# Patient Record
Sex: Male | Born: 1970
Health system: Southern US, Community
[De-identification: ages and names within clinical notes are randomized; demographics above are authoritative.]

## PROBLEM LIST (undated history)

## (undated) DIAGNOSIS — Z8719 Personal history of other diseases of the digestive system: Secondary | ICD-10-CM

## (undated) DIAGNOSIS — E119 Type 2 diabetes mellitus without complications: Secondary | ICD-10-CM

## (undated) DIAGNOSIS — N201 Calculus of ureter: Secondary | ICD-10-CM

## (undated) DIAGNOSIS — I10 Essential (primary) hypertension: Secondary | ICD-10-CM

---
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
## 2003-07-27 ENCOUNTER — Emergency Department (HOSPITAL_COMMUNITY): Admission: EM | Admit: 2003-07-27 | Discharge: 2003-07-27 | Payer: Self-pay | Admitting: Emergency Medicine

## 2012-03-19 ENCOUNTER — Emergency Department (HOSPITAL_COMMUNITY): Payer: BC Managed Care – PPO

## 2012-03-19 ENCOUNTER — Encounter (HOSPITAL_COMMUNITY): Payer: Self-pay | Admitting: *Deleted

## 2012-03-19 ENCOUNTER — Emergency Department (HOSPITAL_COMMUNITY)
Admission: EM | Admit: 2012-03-19 | Discharge: 2012-03-20 | Disposition: A | Discharge status: Home / Self Care | Payer: BC Managed Care – PPO | Attending: Emergency Medicine | Admitting: Emergency Medicine

## 2012-03-19 DIAGNOSIS — S7010XA Contusion of unspecified thigh, initial encounter: Secondary | ICD-10-CM | POA: Insufficient documentation

## 2012-03-19 DIAGNOSIS — S7012XA Contusion of left thigh, initial encounter: Secondary | ICD-10-CM

## 2012-03-19 DIAGNOSIS — W010XXA Fall on same level from slipping, tripping and stumbling without subsequent striking against object, initial encounter: Secondary | ICD-10-CM | POA: Insufficient documentation

## 2012-03-19 DIAGNOSIS — S5780XA Crushing injury of unspecified forearm, initial encounter: Secondary | ICD-10-CM

## 2012-03-19 DIAGNOSIS — Y9389 Activity, other specified: Secondary | ICD-10-CM | POA: Insufficient documentation

## 2012-03-19 DIAGNOSIS — Y9289 Other specified places as the place of occurrence of the external cause: Secondary | ICD-10-CM | POA: Insufficient documentation

## 2012-03-19 LAB — TYPE AND SCREEN
ABO/RH(D): O POS
Antibody Screen: NEGATIVE

## 2012-03-19 LAB — CBC
HCT: 38.2 % — ABNORMAL LOW (ref 39.0–52.0)
Hemoglobin: 13.6 g/dL (ref 13.0–17.0)
MCH: 28.6 pg (ref 26.0–34.0)
MCHC: 35.6 g/dL (ref 30.0–36.0)
MCV: 80.3 fL (ref 78.0–100.0)
Platelets: 251 K/uL (ref 150–400)
RBC: 4.76 MIL/uL (ref 4.22–5.81)
RDW: 13.3 % (ref 11.5–15.5)
WBC: 18.1 K/uL — ABNORMAL HIGH (ref 4.0–10.5)

## 2012-03-19 LAB — BASIC METABOLIC PANEL
BUN: 19 mg/dL (ref 6–23)
CO2: 24 mEq/L (ref 19–32)
Chloride: 101 mEq/L (ref 96–112)
Creatinine, Ser: 1.62 mg/dL — ABNORMAL HIGH (ref 0.50–1.35)

## 2012-03-19 LAB — PROTIME-INR: Prothrombin Time: 13.2 seconds (ref 11.6–15.2)

## 2012-03-19 MED ORDER — SODIUM CHLORIDE 0.9 % IV SOLN
1000.0000 mL | INTRAVENOUS | Status: DC
  Administered 2012-03-19: 1000 mL via INTRAVENOUS

## 2012-03-19 MED ORDER — OXYCODONE-ACETAMINOPHEN 5-325 MG PO TABS
2.0000 | ORAL_TABLET | Freq: Once | ORAL | Status: AC
  Administered 2012-03-19: 2 via ORAL
  Filled 2012-03-19: qty 2

## 2012-03-19 MED ORDER — MORPHINE SULFATE 4 MG/ML IJ SOLN
4.0000 mg | Freq: Once | INTRAMUSCULAR | Status: AC
  Administered 2012-03-19: 4 mg via INTRAVENOUS
  Filled 2012-03-19: qty 1

## 2012-03-19 MED ORDER — SODIUM CHLORIDE 0.9 % IV SOLN
1000.0000 mL | Freq: Once | INTRAVENOUS | Status: AC
  Administered 2012-03-19: 1000 mL via INTRAVENOUS

## 2012-03-19 NOTE — ED Provider Notes (Signed)
History     CSN: [id number]  Arrival date & time 03/19/12  4098   First MD Initiated Contact with Patient 03/19/12 1825      Chief Complaint  Patient presents with  . Leg Pain     The history is provided by the patient.   patient reports injuring his left thigh earlier today when he slid on ice with his left foot in his anterior left thigh ran into his thumb from his car.  He initially didn't have any significant swelling just some bruising however late in the day he acutely developed severe pain and swelling of his left anterior thigh.  He is on anticoagulants.  At that time he began to feel slightly sweaty as well.  His pain at this time is moderate to severe.  He states the swelling hasn't significantly worsened over the past several hours.  Given the severity of his pain he came to the emergency department for evaluation.  History reviewed. No pertinent past medical history.  Past Surgical History  Procedure Laterality Date  . Appendectomy      No family history on file.  History  Substance Use Topics  . Smoking status: Never Smoker   . Smokeless tobacco: Not on file  . Alcohol Use: Yes     Comment: weekends      Review of Systems  All other systems reviewed and are negative.    Allergies  Review of patient's allergies indicates no known allergies.  Home Medications   Current Outpatient Rx  Name  Route  Sig  Dispense  Refill  . acetaminophen (TYLENOL) 325 MG tablet   Oral   Take 650 mg by mouth every 6 (six) hours as needed for pain.           BP 88/51  Pulse 69  Temp(Src) 98.2 F (36.8 C) (Oral)  Resp 19  Ht 5\' 10"  (1.778 m)  Wt 245 lb (111.131 kg)  BMI 35.15 kg/m2  SpO2 96%  Physical Exam  Nursing note and vitals reviewed. Constitutional: He is oriented to person, place, and time. He appears well-developed and well-nourished.  HENT:  Head: Normocephalic and atraumatic.  Eyes: EOM are normal.  Neck: Normal range of motion.  Cardiovascular:  Normal rate, regular rhythm, normal heart sounds and intact distal pulses.   Pulmonary/Chest: Effort normal and breath sounds normal. No respiratory distress.  Abdominal: Soft. He exhibits no distension. There is no tenderness.  Musculoskeletal:  Normal PT and DP pulses her left foot.  No pain with range of motion of left foot.  Large anterior left mid thigh hematoma with significant swelling.  No pulsatile mass.  No swelling or fullness of his posterior left thigh.  Limited range of motion with flexion of his left hip given the severity of his left thigh pain  Neurological: He is alert and oriented to person, place, and time.  Skin: Skin is warm and dry.  Psychiatric: He has a normal mood and affect. Judgment normal.    ED Course  Procedures (including critical care time)  Labs Reviewed  CBC - Abnormal; Notable for the following:    WBC 18.1 (*)    HCT 38.2 (*)    All other components within normal limits  BASIC METABOLIC PANEL - Abnormal; Notable for the following:    Glucose, Bld 156 (*)    Creatinine, Ser 1.62 (*)    GFR calc non Af Amer 50 (*)    GFR calc Af Amer 58 (*)  All other components within normal limits  PROTIME-INR  TYPE AND SCREEN  ABO/RH   Dg Femur Left  03/19/2012  *RADIOLOGY REPORT*  Clinical Data: Leg pain, blunt trauma  LEFT FEMUR - 2 VIEW  Comparison: None.  Findings: The left hip is located.  No evidence of fracture of the proximal or distal left femur.  The knee joint is intact on two views.  IMPRESSION:  No left femur fracture.   Original Report Authenticated By: Genevive Bi, M.D.    I personally reviewed the imaging tests through PACS system I reviewed available ER/hospitalization records through the EMR   1. Hematoma of thigh, left, initial encounter   2. Orthostatic hypotension       MDM  8:42 PM Is usually if lady hematoma there is left thigh.  The sizes not been increasing over the past several hours.  It is a large left thigh.  He is on  anticoagulants.  At time of discharge the patient stood up became severely lightheaded and nearly passed out.  Drop his pressure down into the knees.  The patient may have bled more and his left side and I initially suspected.  His initial hemoglobin is normal but this may have not equilibrating yet.  I think the patient will benefit from observation overnight and serial CBCs and pain control.  Ice and elevation at this time.  12:01 AM The patient was seen and evaluated by the trauma surgeon at approximately 9 PM this evening.  The trauma surgeon Dr. Derrell Lolling did not think that the patient needed to be admitted.  He asked that the patient stated emergency department for several hours more and that we try to send the patient home.  The patient feels much better at this time.  Patient is able to stand at the bedside and does not become orthostatic.  His vital signs remain stable.  His had no increase in the swelling of his left thigh.  He continues to have normal pulses in his left lower extremity.  He has no signs of compartment syndrome at this time.  The patient was sent home with pain medicine, ice, elevation, crutches.  He is able to bear full weight on his left leg.  He understands to return for worsening symptoms.  He will followup with the trauma surgeons next week and they'll call him for his followup appointment.  The patient feels comfortable with this plan.      Lyanne Co, MD 03/20/12 Marlyne Beards

## 2012-03-19 NOTE — ED Notes (Signed)
Spoke with ortho regarding crutches.

## 2012-03-19 NOTE — Progress Notes (Signed)
Orthopedic Tech Progress Note Patient Details:  Bryan [name] July 05, 1970 [id number]  Ortho Devices Type of Ortho Device: Ace wrap;Crutches Ortho Device/Splint Location: (L) LE Ortho Device/Splint Interventions: Application;Ordered   Jennye Moccasin 03/19/2012, 8:12 PM

## 2012-03-19 NOTE — ED Notes (Signed)
Ortho tech in with cruthches for pt, upon standing pt had syncopal episode, assisted back into bed.  Pt diaphoretic and pale.  EKG completed, NSR on monitor - BP 88/51, HR 69bpm.  Saline lock IV in place.  Pt alert and oriented at present.  Will continue to monitor.

## 2012-03-19 NOTE — Consult Note (Addendum)
Reason for Consult:Left thigh hematoma status post trauma Referring Physician: Dr. Erven Colla MESHULEM ONORATO is an 42 y.o. male.  HPI: the patient is a 42 year old male status post a level fall onto a metal object on his car [place] bumper used to be. The patient states he pain at the time was able to proceed to work. He states he was on his feet are often other day but he had pain in that area. Several hours before presenting to the ED patient states the area began to swell and become ecchymotic. The patient was able to walk fairly well. While in the ED area hematoma had been consistent not growing.  History reviewed. No pertinent past medical history.  Past Surgical History  Procedure Laterality Date  . Appendectomy      No family history on file.  Social History:  reports that he has never smoked. He does not have any smokeless tobacco history on file. He reports that  drinks alcohol. He reports that he does not use illicit drugs.  Allergies: No Known Allergies  Medications: I have reviewed the patient's current medications.  Results for orders placed during the hospital encounter of 03/19/12 (from the past 48 hour(s))  CBC     Status: Abnormal   Collection Time    03/19/12  6:43 PM      Result Value Range   WBC 18.1 (*) 4.0 - 10.5 K/uL   RBC 4.76  4.22 - 5.81 MIL/uL   Hemoglobin 13.6  13.0 - 17.0 g/dL   HCT 16.1 (*) 09.6 - 04.5 %   MCV 80.3  78.0 - 100.0 fL   MCH 28.6  26.0 - 34.0 pg   MCHC 35.6  30.0 - 36.0 g/dL   RDW 40.9  81.1 - 91.4 %   Platelets 251  150 - 400 K/uL  BASIC METABOLIC PANEL     Status: Abnormal   Collection Time    03/19/12  6:43 PM      Result Value Range   Sodium 137  135 - 145 mEq/L   Potassium 3.8  3.5 - 5.1 mEq/L   Chloride 101  96 - 112 mEq/L   CO2 24  19 - 32 mEq/L   Glucose, Bld 156 (*) 70 - 99 mg/dL   BUN 19  6 - 23 mg/dL   Creatinine, Ser 7.82 (*) 0.50 - 1.35 mg/dL   Calcium 9.2  8.4 - 95.6 mg/dL   GFR calc non Af Amer 50 (*) >90  mL/min   GFR calc Af Amer 58 (*) >90 mL/min   Comment:            The eGFR has been calculated     using the CKD EPI equation.     This calculation has not been     validated in all clinical     situations.     eGFR's persistently     <90 mL/min signify     possible Chronic Kidney Disease.  PROTIME-INR     Status: None   Collection Time    03/19/12  6:43 PM      Result Value Range   Prothrombin Time 13.2  11.6 - 15.2 seconds   INR 1.01  0.00 - 1.49  TYPE AND SCREEN     Status: None   Collection Time    03/19/12  9:00 PM      Result Value Range   ABO/RH(D) O POS     Antibody Screen NEG  Sample Expiration 03/22/2012    ABO/RH     Status: None   Collection Time    03/19/12  9:00 PM      Result Value Range   ABO/RH(D) O POS      Dg Femur Left  03/19/2012  *RADIOLOGY REPORT*  Clinical Data: Leg pain, blunt trauma  LEFT FEMUR - 2 VIEW  Comparison: None.  Findings: The left hip is located.  No evidence of fracture of the proximal or distal left femur.  The knee joint is intact on two views.  IMPRESSION:  No left femur fracture.   Original Report Authenticated By: Genevive Bi, M.D.     Review of Systems  HENT: Negative.   Respiratory: Negative.   Cardiovascular: Negative.   Gastrointestinal: Negative.   Skin: Negative.   Neurological: Negative.   All other systems reviewed and are negative.   Blood pressure 124/72, pulse 80, temperature 98.2 F (36.8 C), temperature source Oral, resp. rate 20, height 5\' 10"  (1.778 m), weight 245 lb (111.131 kg), SpO2 98.00%. Physical Exam  Constitutional: He is oriented to person, place, and time. He appears well-developed and well-nourished.  HENT:  Head: Normocephalic and atraumatic.  Eyes: Conjunctivae and EOM are normal. Pupils are equal, round, and reactive to light.  Cardiovascular: Normal rate, regular rhythm and normal heart sounds.   Pulses:      Dorsalis pedis pulses are 2+ on the right side, and 2+ on the left side.        Posterior tibial pulses are 2+ on the right side, and 2+ on the left side.  Respiratory: Effort normal and breath sounds normal.  GI: Soft. Bowel sounds are normal.  Musculoskeletal:       Left upper leg: He exhibits tenderness (over ecchymosis), swelling and edema.       Legs: Neurological: He is alert and oriented to person, place, and time.    Assessment/Plan: 42 year old male with a left thigh hematoma.  He had a stable blood pressure and heart rate, size of hematoma and hematocrit while in the ED.  1. We'll have patient followup in trauma clinic to evaluate his left thigh hematoma. I discussed with the patient at artery become larger in size, or his foot/gastrocnemius become tender he should return in the ED immediately.   Marigene Ehlers., Armando 03/19/2012, 10:50 PM

## 2012-03-19 NOTE — ED Notes (Signed)
Ambulated pt per Dr. Patria Mane request.  Pt tolerated well.  Denied any dizziness and no diaphoresis noted.

## 2012-03-19 NOTE — ED Notes (Signed)
Around 8 am pt was on a hill, slipped on ice.  The bumper of his truck caught the weight of his fall, his L ant thigh taking the weight.  Pt states the thigh was bruised all day, but about 1500 pt began experiencing a great deal of pain and he began feeling diaphoretic.  Approx 6 x 6 inch hematoma to L ant thigh.

## 2012-03-19 NOTE — ED Notes (Signed)
Pt presents to ED for evaluation of a fall.  Pt was walking down driveway this morning around 8am when he slipped on ice and fell into his car bumper hitting his left upper leg against the bumper.  Pt went to work today and noticed a large hematoma to the leg.  Upon inspection leg feels warm and dry, skin appears tight, left pedal pulse strong and present.  Will continue to monitor.

## 2012-03-20 MED ORDER — OXYCODONE-ACETAMINOPHEN 5-325 MG PO TABS: 1.0000 | ORAL_TABLET | ORAL | Status: DC | PRN

## 2012-03-22 ENCOUNTER — Encounter (INDEPENDENT_AMBULATORY_CARE_PROVIDER_SITE_OTHER): Payer: BC Managed Care – PPO

## 2012-03-27 ENCOUNTER — Telehealth (HOSPITAL_COMMUNITY): Payer: Self-pay | Admitting: Emergency Medicine

## 2012-03-27 NOTE — Telephone Encounter (Signed)
Pt said hematoma and bruising was worse. He was unable to make appointment last week. I offered him an appointment on Friday but he thought he would like to be seen sooner. I recommended he go to an UC and if he needed/wanted to follow up with [place] to give [place] a call back.

## 2012-03-28 NOTE — Telephone Encounter (Signed)
scheduled appt

## 2012-03-29 ENCOUNTER — Encounter (INDEPENDENT_AMBULATORY_CARE_PROVIDER_SITE_OTHER): Payer: Self-pay

## 2012-03-29 ENCOUNTER — Encounter (INDEPENDENT_AMBULATORY_CARE_PROVIDER_SITE_OTHER): Payer: Self-pay | Admitting: General Surgery

## 2012-03-29 ENCOUNTER — Ambulatory Visit (INDEPENDENT_AMBULATORY_CARE_PROVIDER_SITE_OTHER): Payer: BC Managed Care – PPO | Admitting: General Surgery

## 2012-03-29 VITALS — BP 122/88 | HR 91 | Temp 96.8°F | Resp 16 | Ht 70.0 in | Wt 256.8 lb

## 2012-03-29 DIAGNOSIS — S7012XD Contusion of left thigh, subsequent encounter: Secondary | ICD-10-CM

## 2012-03-29 DIAGNOSIS — S7010XA Contusion of unspecified thigh, initial encounter: Secondary | ICD-10-CM

## 2012-03-29 NOTE — Patient Instructions (Addendum)
1.  Continue antibiotic - Keflex for full 10 days 2.  Elevation, Compression (ace wrap), Ice, anti-inflammatories (Ibuprofen), ROM exercises, dressing changes and wound care 3.  Follow up next week in trauma clinic for recheck 4.  Ibuprofen 600mg  BID to reduce swelling

## 2012-03-29 NOTE — Progress Notes (Signed)
Subjective: Bryan [name] is a 42 y.o. male who presents today for follow up from large anterior left mid thigh hematoma he sustained after a ground level fall on ice onto a metal object on his car where his bumper used to be.  He was seen in the [facility] on 03/19/12.  Pt complains of continued pain and a large hematoma in the left anterior thigh but also of dependent edema/ecchymosis which is tracking to his lower leg into his foot.  He notes the hematoma has gone down a bit, but he developed blisters over the hematoma which have since ruptured.  He has been applying antibiotic ointment to them.  He went to the UC a few days ago and they gave him Keflex for a suspected infection.  He was given 10 days worth.     Objective: Vital signs in last 24 hours: Reviewed   PE: General:  Alert, NAD, pleasant Left thigh: Impressive large left mid-thigh hematoma with 4cm round unroofed blister, and multiple smaller <1cm blisters which are weeping serous fluid.  5mm rim of erythema over the larger blister.  The hematoma is soft and mobile similar to a cyst.  He is most tender over the medial aspect of the thigh.   Thigh girth - 67.5cm at most prominent part hematoma (mid/upper thigh) L LE: some dependent swelling and ecchymosis to the lower leg, foot and ankle, NT, CSM intact, +2 DP pulse   Assessment/Plan  S/P fall on ice into metal part of his car - large anterior left mid thigh hematoma:  1.  Continue prophylactic Keflex for full 10 days 2.  Elevation, Compression (ace), Ice, Nsaids, ROM exercises, dressing changes and wound care 3.  Follow up next week in trauma clinic for recheck 4.  Ibuprofen 600mg  BID to reduce swelling     Aris Georgia, PA-C 03/29/2012

## 2012-04-05 ENCOUNTER — Encounter (INDEPENDENT_AMBULATORY_CARE_PROVIDER_SITE_OTHER): Payer: Self-pay | Admitting: General Surgery

## 2012-04-05 ENCOUNTER — Ambulatory Visit (INDEPENDENT_AMBULATORY_CARE_PROVIDER_SITE_OTHER): Payer: BC Managed Care – PPO | Admitting: Internal Medicine

## 2012-04-05 ENCOUNTER — Encounter (INDEPENDENT_AMBULATORY_CARE_PROVIDER_SITE_OTHER): Payer: Self-pay

## 2012-04-05 VITALS — BP 124/88 | HR 90 | Temp 97.0°F | Resp 16 | Ht 70.0 in | Wt 257.4 lb

## 2012-04-05 DIAGNOSIS — S7010XA Contusion of unspecified thigh, initial encounter: Secondary | ICD-10-CM

## 2012-04-05 DIAGNOSIS — S7012XD Contusion of left thigh, subsequent encounter: Secondary | ICD-10-CM

## 2012-04-05 NOTE — Patient Instructions (Signed)
Warm compresses three times daily May use ice as well. May return to work on 04/08/12 but stop if symptoms worsen F/u in 2 weeks.

## 2012-04-05 NOTE — Progress Notes (Signed)
  Subjective: Pt returns to the clinic today after suffering a left thigh hematoma.  He is doing well overall.  His hematoma has not changed much but he feels it is somewhat smaller.  Objective: Vital signs in last 24 hours: Reviewed  PE: Heart: rrr Lungs: cta bil Abd: soft, nt, nd Ext: large left mid-thigh hematoma with 4cm round unroofed blister, and multiple smaller <1cm blisters which are weeping serous fluid. 5mm rim of erythema over the larger blister. The hematoma is soft and mobile similar to a cyst. He is most tender over the medial aspect of the thigh   Lab Results:  No results found for this basename: WBC, HGB, HCT, PLT,  in the last 72 hours BMET No results found for this basename: NA, K, CL, CO2, GLUCOSE, BUN, CREATININE, CALCIUM,  in the last 72 hours PT/INR No results found for this basename: LABPROT, INR,  in the last 72 hours CMP     Component Value Date/Time   NA 137 03/19/2012 1843   K 3.8 03/19/2012 1843   CL 101 03/19/2012 1843   CO2 24 03/19/2012 1843   GLUCOSE 156* 03/19/2012 1843   BUN 19 03/19/2012 1843   CREATININE 1.62* 03/19/2012 1843   CALCIUM 9.2 03/19/2012 1843   GFRNONAA 50* 03/19/2012 1843   GFRAA 58* 03/19/2012 1843   Lipase  No results found for this basename: lipase       Studies/Results: No results found.  Anti-infectives: Anti-infectives   None       Assessment/Plan  1.  S/P left thigh hematoma: we will follow up in 2 weeks for recheck, may return to work but is advised to stop if it begins to enlarge or becomes very painful.     WHITE, ELIZABETH 04/05/2012

## 2012-04-19 ENCOUNTER — Encounter (INDEPENDENT_AMBULATORY_CARE_PROVIDER_SITE_OTHER): Payer: Self-pay

## 2012-04-19 ENCOUNTER — Telehealth (INDEPENDENT_AMBULATORY_CARE_PROVIDER_SITE_OTHER): Payer: Self-pay | Admitting: General Surgery

## 2012-04-19 ENCOUNTER — Ambulatory Visit (INDEPENDENT_AMBULATORY_CARE_PROVIDER_SITE_OTHER): Payer: BC Managed Care – PPO | Admitting: Orthopedic Surgery

## 2012-04-19 ENCOUNTER — Other Ambulatory Visit (INDEPENDENT_AMBULATORY_CARE_PROVIDER_SITE_OTHER): Payer: Self-pay | Admitting: General Surgery

## 2012-04-19 VITALS — BP 124/82 | HR 70 | Temp 96.8°F | Resp 16 | Ht 70.0 in | Wt 257.2 lb

## 2012-04-19 DIAGNOSIS — S7012XD Contusion of left thigh, subsequent encounter: Secondary | ICD-10-CM

## 2012-04-19 DIAGNOSIS — S7010XA Contusion of unspecified thigh, initial encounter: Secondary | ICD-10-CM

## 2012-04-19 NOTE — Progress Notes (Signed)
Subjective Bryan [name] comes in s/p left thigh hematoma. He says it's gotten a little better. The blister that is over the area bothers him more than anything.   Objective Left thigh: Large fluctuent mass over anterior left thigh ~19cm x 14cm with 5x2cm wound with good granulation     Procedure Aspiration of left thigh hematoma vs seroma  Surgeon: Charma Igo, PA-C  Assist: None  Anesthesia: 4ml lidocaine 1% w/epi  EBL: Minimal  Complications: None  Findings: Pros and cons of procedure, including infection and recurrence, and consequences of not doing procedure were explained to patient who expressed understanding and desire to proceed. The skin was prepped in a sterile fashion and local anesthetic was infiltrated into the subcutaneous tissue over the mass. A 16-gauge needle was introduced laterally but could only aspirate about 50ml old blood. Another area was anesthetized superiorly and I was able to draw off ~279ml of old blood. Patient tolerated procedure well.   Assessment & Plan Left thigh hematoma -- Advised wrapping with ACE and elevation for next 72 hours. F/u 2 weeks.   Freeman Caldron, PA-C Pager: [phone] General Trauma PA Pager: [phone]

## 2012-04-19 NOTE — Telephone Encounter (Signed)
LMOM on both numbers to let patient know that his appt for 4/18 @ 10 with trauma had been R/S to 4/25 @ 10 due to Good Friday Holiday.Marland KitchenMarland Kitchen

## 2012-05-03 ENCOUNTER — Encounter (INDEPENDENT_AMBULATORY_CARE_PROVIDER_SITE_OTHER): Payer: BC Managed Care – PPO

## 2012-05-10 ENCOUNTER — Encounter (INDEPENDENT_AMBULATORY_CARE_PROVIDER_SITE_OTHER): Payer: Self-pay | Admitting: Internal Medicine

## 2012-05-10 ENCOUNTER — Ambulatory Visit (INDEPENDENT_AMBULATORY_CARE_PROVIDER_SITE_OTHER): Payer: BC Managed Care – PPO | Admitting: General Surgery

## 2012-05-10 VITALS — BP 140/92 | HR 84 | Temp 99.9°F | Resp 18 | Ht 70.0 in | Wt 254.0 lb

## 2012-05-10 DIAGNOSIS — S7012XD Contusion of left thigh, subsequent encounter: Secondary | ICD-10-CM

## 2012-05-10 DIAGNOSIS — S7010XA Contusion of unspecified thigh, initial encounter: Secondary | ICD-10-CM

## 2012-05-10 NOTE — Patient Instructions (Addendum)
Please continue to use the ACE wrap every day until the hematoma is resolved.  Continue the Ibuprofen but take 400mg  (2 tabs) with breakfast and dinner.  Apply heat and massage to the area to help it to go away.  See [place] in 3 weeks.  Please come at 9:40am for check in for 10:00am appt.

## 2012-05-10 NOTE — Progress Notes (Signed)
Subjective  42 yr old male who returns to the clinic today to recheck his left thigh hematoma. He underwent aspiration 2 weeks ago.  He says after the aspiration the thigh was fairly flat.  A few days after the aspiration the area returned to thigh.  His pain is much improved. He has stopped using his ace wrap.  He applies ice/heat to the area and is taking 200mg  Ibuprofen BID.  Pt denies problems with mobility.    Objective  Left thigh: Large hematoma much improved.  Thigh with some palpable hematoma, but is not liquid feels gelatinous.    Assessment & Plan  Left thigh hematoma -- Much improved after aspiration, but with return or residual hematoma.  Please continue to use the ACE wrap every day until the hematoma is resolved.  Continue the Ibuprofen but take 400mg  (2 tabs) with breakfast and dinner.  Apply heat and massage to the area to help it resolve.  See [place] in 3 weeks.  Please come at 9:40am for check in for 10:00am appt.     Bryan [name] 05/10/2012 11:10 AM

## 2012-05-31 ENCOUNTER — Encounter (INDEPENDENT_AMBULATORY_CARE_PROVIDER_SITE_OTHER): Payer: BC Managed Care – PPO

## 2014-07-30 ENCOUNTER — Inpatient Hospital Stay (HOSPITAL_COMMUNITY)
Admission: EM | Admit: 2014-07-30 | Discharge: 2014-08-04 | DRG: 392 | Disposition: A | Discharge status: Home / Self Care | Payer: BLUE CROSS/BLUE SHIELD | Attending: General Surgery | Admitting: General Surgery

## 2014-07-30 ENCOUNTER — Emergency Department (HOSPITAL_COMMUNITY): Payer: BLUE CROSS/BLUE SHIELD

## 2014-07-30 ENCOUNTER — Encounter (HOSPITAL_COMMUNITY): Payer: Self-pay | Admitting: Neurology

## 2014-07-30 DIAGNOSIS — R319 Hematuria, unspecified: Secondary | ICD-10-CM | POA: Diagnosis present

## 2014-07-30 DIAGNOSIS — R1084 Generalized abdominal pain: Secondary | ICD-10-CM

## 2014-07-30 DIAGNOSIS — R509 Fever, unspecified: Secondary | ICD-10-CM

## 2014-07-30 DIAGNOSIS — Z8719 Personal history of other diseases of the digestive system: Secondary | ICD-10-CM

## 2014-07-30 DIAGNOSIS — K5720 Diverticulitis of large intestine with perforation and abscess without bleeding: Principal | ICD-10-CM | POA: Diagnosis present

## 2014-07-30 DIAGNOSIS — N390 Urinary tract infection, site not specified: Secondary | ICD-10-CM | POA: Diagnosis present

## 2014-07-30 DIAGNOSIS — K5792 Diverticulitis of intestine, part unspecified, without perforation or abscess without bleeding: Secondary | ICD-10-CM | POA: Diagnosis present

## 2014-07-30 HISTORY — DX: Personal history of other diseases of the digestive system: Z87.19

## 2014-07-30 LAB — COMPREHENSIVE METABOLIC PANEL
ALT: 18 U/L (ref 17–63)
AST: 14 U/L — ABNORMAL LOW (ref 15–41)
Albumin: 3.3 g/dL — ABNORMAL LOW (ref 3.5–5.0)
Alkaline Phosphatase: 53 U/L (ref 38–126)
Anion gap: 7 (ref 5–15)
BUN: 14 mg/dL (ref 6–20)
CO2: 24 mmol/L (ref 22–32)
Calcium: 8.6 mg/dL — ABNORMAL LOW (ref 8.9–10.3)
Chloride: 105 mmol/L (ref 101–111)
Creatinine, Ser: 1.52 mg/dL — ABNORMAL HIGH (ref 0.61–1.24)
GFR calc Af Amer: >60 mL/min (ref >60)
GFR calc non Af Amer: 53 mL/min — ABNORMAL LOW (ref >60)
Glucose, Bld: 149 mg/dL — ABNORMAL HIGH (ref 65–99)
Potassium: 3.6 mmol/L (ref 3.5–5.1)
Sodium: 136 mmol/L (ref 135–145)
Total Bilirubin: 0.8 mg/dL (ref 0.3–1.2)
Total Protein: 6.9 g/dL (ref 6.5–8.1)

## 2014-07-30 LAB — URINALYSIS, ROUTINE W REFLEX MICROSCOPIC
Glucose, UA: NEGATIVE mg/dL
Ketones, ur: 15 mg/dL — AB
Nitrite: NEGATIVE
Protein, ur: >300 mg/dL — AB
Specific Gravity, Urine: 1.031 — ABNORMAL HIGH (ref 1.005–1.030)
Urobilinogen, UA: 1 mg/dL (ref 0.0–1.0)
pH: 5 (ref 5.0–8.0)

## 2014-07-30 LAB — CBC
HCT: 40 % (ref 39.0–52.0)
Hemoglobin: 13.6 g/dL (ref 13.0–17.0)
MCH: 28 pg (ref 26.0–34.0)
MCHC: 34 g/dL (ref 30.0–36.0)
MCV: 82.5 fL (ref 78.0–100.0)
Platelets: 223 10*3/uL (ref 150–400)
RBC: 4.85 MIL/uL (ref 4.22–5.81)
RDW: 13.8 % (ref 11.5–15.5)
WBC: 16.7 10*3/uL — ABNORMAL HIGH (ref 4.0–10.5)

## 2014-07-30 LAB — URINE MICROSCOPIC-ADD ON

## 2014-07-30 LAB — LIPASE, BLOOD: Lipase: 19 U/L — ABNORMAL LOW (ref 22–51)

## 2014-07-30 MED ORDER — ONDANSETRON HCL 4 MG/2ML IJ SOLN: 4.0000 mg | Freq: Four times a day (QID) | INTRAMUSCULAR | Status: DC | PRN

## 2014-07-30 MED ORDER — ONDANSETRON HCL 4 MG/2ML IJ SOLN
4.0000 mg | Freq: Once | INTRAMUSCULAR | Status: AC
  Administered 2014-07-30: 4 mg via INTRAVENOUS
  Filled 2014-07-30: qty 2

## 2014-07-30 MED ORDER — HYDROMORPHONE HCL 1 MG/ML IJ SOLN
1.0000 mg | INTRAMUSCULAR | Status: DC | PRN
  Administered 2014-07-30 (×2): 1 mg via INTRAVENOUS
  Administered 2014-07-31 (×7): 2 mg via INTRAVENOUS
  Administered 2014-08-01 (×2): 1 mg via INTRAVENOUS
  Administered 2014-08-01 (×3): 2 mg via INTRAVENOUS
  Administered 2014-08-01 (×2): 1 mg via INTRAVENOUS
  Administered 2014-08-01: 2 mg via INTRAVENOUS
  Administered 2014-08-02 – 2014-08-04 (×13): 1 mg via INTRAVENOUS
  Filled 2014-07-30 (×2): qty 2
  Filled 2014-07-30 (×3): qty 1
  Filled 2014-07-30: qty 2
  Filled 2014-07-30 (×2): qty 1
  Filled 2014-07-30: qty 2
  Filled 2014-07-30 (×5): qty 1
  Filled 2014-07-30 (×2): qty 2
  Filled 2014-07-30 (×4): qty 1
  Filled 2014-07-30: qty 2
  Filled 2014-07-30 (×3): qty 1
  Filled 2014-07-30: qty 2
  Filled 2014-07-30 (×2): qty 1
  Filled 2014-07-30: qty 2
  Filled 2014-07-30: qty 1
  Filled 2014-07-30: qty 2

## 2014-07-30 MED ORDER — ENOXAPARIN SODIUM 40 MG/0.4ML ~~LOC~~ SOLN
40.0000 mg | SUBCUTANEOUS | Status: DC
  Administered 2014-07-30 – 2014-08-03 (×5): 40 mg via SUBCUTANEOUS
  Filled 2014-07-30 (×5): qty 0.4

## 2014-07-30 MED ORDER — DEXTROSE 5 % IV SOLN
1.0000 g | Freq: Once | INTRAVENOUS | Status: AC
  Administered 2014-07-30: 1 g via INTRAVENOUS
  Filled 2014-07-30: qty 10

## 2014-07-30 MED ORDER — ACETAMINOPHEN 325 MG PO TABS: 650.0000 mg | ORAL_TABLET | Freq: Four times a day (QID) | ORAL | Status: DC | PRN

## 2014-07-30 MED ORDER — PANTOPRAZOLE SODIUM 40 MG IV SOLR
40.0000 mg | Freq: Every day | INTRAVENOUS | Status: DC
  Administered 2014-07-30 – 2014-08-03 (×5): 40 mg via INTRAVENOUS
  Filled 2014-07-30 (×5): qty 40

## 2014-07-30 MED ORDER — ACETAMINOPHEN 650 MG RE SUPP: 650.0000 mg | Freq: Four times a day (QID) | RECTAL | Status: DC | PRN

## 2014-07-30 MED ORDER — IOHEXOL 300 MG/ML  SOLN
25.0000 mL | Freq: Once | INTRAMUSCULAR | Status: AC | PRN
  Administered 2014-07-30: 25 mL via ORAL

## 2014-07-30 MED ORDER — CEPHALEXIN 500 MG PO CAPS: 500.0000 mg | ORAL_CAPSULE | Freq: Four times a day (QID) | ORAL | Status: DC

## 2014-07-30 MED ORDER — HYDROMORPHONE HCL 1 MG/ML IJ SOLN
1.0000 mg | Freq: Once | INTRAMUSCULAR | Status: AC
  Administered 2014-07-30: 1 mg via INTRAVENOUS
  Filled 2014-07-30: qty 1

## 2014-07-30 MED ORDER — DEXTROSE 5 % IV SOLN: 1.0000 g | INTRAVENOUS | Status: DC

## 2014-07-30 MED ORDER — IOHEXOL 300 MG/ML  SOLN
100.0000 mL | Freq: Once | INTRAMUSCULAR | Status: AC | PRN
  Administered 2014-07-30: 100 mL via INTRAVENOUS

## 2014-07-30 MED ORDER — SODIUM CHLORIDE 0.9 % IV BOLUS (SEPSIS)
1000.0000 mL | Freq: Once | INTRAVENOUS | Status: AC
  Administered 2014-07-30: 1000 mL via INTRAVENOUS

## 2014-07-30 MED ORDER — METRONIDAZOLE IN NACL 5-0.79 MG/ML-% IV SOLN
500.0000 mg | Freq: Once | INTRAVENOUS | Status: AC
  Administered 2014-07-30: 500 mg via INTRAVENOUS
  Filled 2014-07-30: qty 100

## 2014-07-30 MED ORDER — CHLORHEXIDINE GLUCONATE 0.12 % MT SOLN
15.0000 mL | Freq: Two times a day (BID) | OROMUCOSAL | Status: DC
  Administered 2014-07-30 – 2014-08-03 (×9): 15 mL via OROMUCOSAL
  Filled 2014-07-30 (×7): qty 15

## 2014-07-30 MED ORDER — ACETAMINOPHEN 325 MG PO TABS
650.0000 mg | ORAL_TABLET | Freq: Once | ORAL | Status: AC
  Administered 2014-07-30: 650 mg via ORAL
  Filled 2014-07-30: qty 2

## 2014-07-30 MED ORDER — CEFTRIAXONE SODIUM IN DEXTROSE 40 MG/ML IV SOLN
2.0000 g | INTRAVENOUS | Status: DC
  Administered 2014-07-31 – 2014-08-03 (×4): 2 g via INTRAVENOUS
  Filled 2014-07-30 (×5): qty 50

## 2014-07-30 MED ORDER — POTASSIUM CHLORIDE IN NACL 20-0.45 MEQ/L-% IV SOLN
INTRAVENOUS | Status: DC
  Administered 2014-07-30 – 2014-08-03 (×8): via INTRAVENOUS
  Filled 2014-07-30 (×15): qty 1000

## 2014-07-30 MED ORDER — CETYLPYRIDINIUM CHLORIDE 0.05 % MT LIQD
7.0000 mL | Freq: Two times a day (BID) | OROMUCOSAL | Status: DC
  Administered 2014-07-31 – 2014-08-03 (×4): 7 mL via OROMUCOSAL

## 2014-07-30 MED ORDER — METRONIDAZOLE IN NACL 5-0.79 MG/ML-% IV SOLN
500.0000 mg | Freq: Three times a day (TID) | INTRAVENOUS | Status: DC
  Administered 2014-07-30 – 2014-08-04 (×14): 500 mg via INTRAVENOUS
  Filled 2014-07-30 (×17): qty 100

## 2014-07-30 NOTE — ED Notes (Signed)
Pt reports LLQ abd pain since Monday night, [facility] told blood in urine and given shot for antibiotic. Reports still having pain, urine is cloudy and been running a fever.

## 2014-07-30 NOTE — ED Provider Notes (Signed)
CSN: [id number]     Arrival date & time 07/30/14  [id number] History   First MD Initiated Contact with Patient 07/30/14 1202     Chief Complaint  Patient presents with  . Abdominal Pain     (Consider location/radiation/quality/duration/timing/severity/associated sxs/prior Treatment) HPI   [age]y male with abdominal pain. Onset Monday and progressively worsening since then. Pain is primarily in the left lower quadrant. Relatively constant. Worse with movement. "I have to walk like an old man." No appreciable exacerbating relieving factors. Associated with hematuria. Denies any dysuria. Recently evaluated at urgent care for the same. Patient reports that he was told "I had an abdominal infection and got an antibiotic shot." He is not sure what specifically he was being treated for. He was not prescribed antibiotics. Symptoms have not improved. Subjective fever and chills. Nausea, but no vomiting. Has not had a bowel movement 3 days. Appendectomy as a small child. No abdominal surgeries otherwise.  History reviewed. No pertinent past medical history. Past Surgical History  Procedure Laterality Date  . Appendectomy     Family History  Problem Relation Age of Onset  . Cancer Father     lung   History  Substance Use Topics  . Smoking status: Never Smoker   . Smokeless tobacco: Not on file  . Alcohol Use: Yes     Comment: weekends    Review of Systems  All systems reviewed and negative, other than as noted in HPI.   Allergies  Review of patient's allergies indicates no known allergies.  Home Medications   Prior to Admission medications   Medication Sig Start Date End Date Taking? Authorizing Provider  ibuprofen (ADVIL,MOTRIN) 200 MG tablet Take 200 mg by mouth every 6 (six) hours as needed for pain.   Yes Historical Provider, MD  cephALEXin (KEFLEX) 500 MG capsule Take 1 capsule (500 mg total) by mouth 4 (four) times daily. 07/30/14   Raeford RazorStephen Lalanya Rufener, MD   BP 147/78 mmHg  Pulse 89   Temp(Src) 101.9 F (38.8 C) (Oral)  Resp 18  Ht 5\' 10"  (1.778 m)  Wt 270 lb (122.471 kg)  BMI 38.74 kg/m2  SpO2 95% Physical Exam  Constitutional: He appears well-developed and well-nourished. No distress.  HENT:  Head: Normocephalic and atraumatic.  Eyes: Conjunctivae are normal. Right eye exhibits no discharge. Left eye exhibits no discharge.  Neck: Neck supple.  Cardiovascular: Normal rate, regular rhythm and normal heart sounds.  Exam reveals no gallop and no friction rub.   No murmur heard. Pulmonary/Chest: Effort normal and breath sounds normal. No respiratory distress.  Abdominal: Soft. He exhibits no distension. There is tenderness.  Diffusely tender. Focally worse in LLQ. No distension. NO rebound.   Genitourinary:  No cva tenderness  Musculoskeletal: He exhibits no edema or tenderness.  Neurological: He is alert.  Skin: Skin is warm and dry.  Psychiatric: He has a normal mood and affect. His behavior is normal. Thought content normal.  Nursing note and vitals reviewed.   is ED Course  Procedures (including critical care time) Labs Review Labs Reviewed  LIPASE, BLOOD - Abnormal; Notable for the following:    Lipase 19 (*)    All other components within normal limits  COMPREHENSIVE METABOLIC PANEL - Abnormal; Notable for the following:    Glucose, Bld 149 (*)    Creatinine, Ser 1.52 (*)    Calcium 8.6 (*)    Albumin 3.3 (*)    AST 14 (*)    GFR calc non Af Denyse DagoAmer  53 (*)    All other components within normal limits  CBC - Abnormal; Notable for the following:    WBC 16.7 (*)    All other components within normal limits  URINALYSIS, ROUTINE W REFLEX MICROSCOPIC (NOT AT [facility]) - Abnormal; Notable for the following:    Color, Urine BROWN (*)    APPearance TURBID (*)    Specific Gravity, Urine 1.031 (*)    Hgb urine dipstick LARGE (*)    Bilirubin Urine MODERATE (*)    Ketones, ur 15 (*)    Protein, ur >300 (*)    Leukocytes, UA SMALL (*)    All other components  within normal limits  URINE MICROSCOPIC-ADD ON - Abnormal; Notable for the following:    Bacteria, UA MANY (*)    Casts HYALINE CASTS (*)    All other components within normal limits  URINE CULTURE    Imaging Review Ct Abdomen Pelvis W Contrast  07/30/2014   CLINICAL DATA:  Acute left lower quadrant abdominal pain and fever.  EXAM: CT ABDOMEN AND PELVIS WITH CONTRAST  TECHNIQUE: Multidetector CT imaging of the abdomen and pelvis was performed using the standard protocol following bolus administration of intravenous contrast.  CONTRAST:  OMNIPAQUE IOHEXOL 300 MG/ML  SOLN  COMPARISON:  None.  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 Visualized lung bases appear normal.  No gallstones are noted. Fatty infiltration of the liver is noted. The spleen and pancreas appear normal. Adrenal glands appear normal. Nonobstructive calculus is noted in midpole collecting system of right kidney. No hydronephrosis or renal obstruction is noted. No ureteral calculi are noted. The patient is reportedly status post appendectomy. There is severe focal diverticulitis involving the proximal sigmoid colon with multiple gas collections seen in the surrounding peritoneal fat consistent with perforation. Some free fluid is seen adjacent to this. Urinary bladder appears normal. No significant adenopathy is noted  IMPRESSION: Fatty infiltration of the liver.  Nonobstructive right renal calculus.  Severe focal diverticulitis of proximal sigmoid colon is noted, with gas noted in the surrounding peritoneal fat consistent with perforation. Some free fluid is seen in this area as well.   Electronically Signed   By: Lupita Raider, M.D.   On: 07/30/2014 16:34     EKG Interpretation None      MDM   Final diagnoses:  Generalized abdominal pain  Fever, unspecified fever cause  UTI (lower urinary tract infection)    28y male with abdominal pain. Left lower quadrant. Diffusely tender  on exam, but focally worse in the left lower quadrant. UA suggestive of UTI. Rocephin was initially ordered. His exam seemed out of proportion for typical UTI though and atypical to be focally worse in LLQ. CT was done.  Diverticulitis with small amount of free air.  Flagyl was ordered for anaerobic coverage. Surgical consultation.  4:55 PM Discussed with Dr Magnus Ivan, surgery. Changing coverage at 5PM. Will pass along information to oncoming provider.     Raeford Razor, MD 07/30/14 [phone]

## 2014-07-30 NOTE — H&P (Signed)
Bryan [name] is an 44 y.o. male.   Chief Complaint: Abdominal pain, severe HPI: Worsening abdominal pain since Tuesday, associated with left sided tenderness and a CT that shows acute diverticulititis with microperforation.  History reviewed. No pertinent past medical history.  Past Surgical History  Procedure Laterality Date  . Appendectomy      Family History  Problem Relation Age of Onset  . Cancer Father     lung   Social History:  reports that he has never smoked. He does not have any smokeless tobacco history on file. He reports that he drinks alcohol. He reports that he does not use illicit drugs.  Allergies: No Known Allergies   (Not in a hospital admission)  Results for orders placed or performed during the hospital encounter of 07/30/14 (from the past 48 hour(s))  Lipase, blood     Status: Abnormal   Collection Time: 07/30/14 10:00 AM  Result Value Ref Range   Lipase 19 (L) 22 - 51 U/L  Comprehensive metabolic panel     Status: Abnormal   Collection Time: 07/30/14 10:00 AM  Result Value Ref Range   Sodium 136 135 - 145 mmol/L   Potassium 3.6 3.5 - 5.1 mmol/L   Chloride 105 101 - 111 mmol/L   CO2 24 22 - 32 mmol/L   Glucose, Bld 149 (H) 65 - 99 mg/dL   BUN 14 6 - 20 mg/dL   Creatinine, Ser 1.52 (H) 0.61 - 1.24 mg/dL   Calcium 8.6 (L) 8.9 - 10.3 mg/dL   Total Protein 6.9 6.5 - 8.1 g/dL   Albumin 3.3 (L) 3.5 - 5.0 g/dL   AST 14 (L) 15 - 41 U/L   ALT 18 17 - 63 U/L   Alkaline Phosphatase 53 38 - 126 U/L   Total Bilirubin 0.8 0.3 - 1.2 mg/dL   GFR calc non Af Amer 53 (L) >60 mL/min   GFR calc Af Amer >60 >60 mL/min    Comment: (NOTE) The eGFR has been calculated using the CKD EPI equation. This calculation has not been validated in all clinical situations. eGFR's persistently <60 mL/min signify possible Chronic Kidney Disease.    Anion gap 7 5 - 15  CBC     Status: Abnormal   Collection Time: 07/30/14 10:00 AM  Result Value Ref Range   WBC 16.7  (H) 4.0 - 10.5 K/uL   RBC 4.85 4.22 - 5.81 MIL/uL   Hemoglobin 13.6 13.0 - 17.0 g/dL   HCT 40.0 39.0 - 52.0 %   MCV 82.5 78.0 - 100.0 fL   MCH 28.0 26.0 - 34.0 pg   MCHC 34.0 30.0 - 36.0 g/dL   RDW 13.8 11.5 - 15.5 %   Platelets 223 150 - 400 K/uL  Urinalysis, Routine w reflex microscopic (not at [facility])     Status: Abnormal   Collection Time: 07/30/14  1:57 PM  Result Value Ref Range   Color, Urine BROWN (A) YELLOW    Comment: BIOCHEMICALS MAY BE AFFECTED BY COLOR   APPearance TURBID (A) CLEAR   Specific Gravity, Urine 1.031 (H) 1.005 - 1.030   pH 5.0 5.0 - 8.0   Glucose, UA NEGATIVE NEGATIVE mg/dL   Hgb urine dipstick LARGE (A) NEGATIVE   Bilirubin Urine MODERATE (A) NEGATIVE   Ketones, ur 15 (A) NEGATIVE mg/dL   Protein, ur >300 (A) NEGATIVE mg/dL   Urobilinogen, UA 1.0 0.0 - 1.0 mg/dL   Nitrite NEGATIVE NEGATIVE   Leukocytes, UA SMALL (A) NEGATIVE  Urine microscopic-add on     Status: Abnormal   Collection Time: 07/30/14  1:57 PM  Result Value Ref Range   WBC, UA 7-10 <3 WBC/hpf   RBC / HPF 21-50 <3 RBC/hpf   Bacteria, UA MANY (A) RARE   Casts HYALINE CASTS (A) NEGATIVE    Comment: GRANULAR CAST   Ct Abdomen Pelvis W Contrast  07/30/2014   CLINICAL DATA:  Acute left lower quadrant abdominal pain and fever.  EXAM: CT ABDOMEN AND PELVIS WITH CONTRAST  TECHNIQUE: Multidetector CT imaging of the abdomen and pelvis was performed using the standard protocol following bolus administration of intravenous contrast.  CONTRAST:  162m OMNIPAQUE IOHEXOL 300 MG/ML  SOLN  COMPARISON:  None.  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 Visualized lung bases appear normal.  No gallstones are noted. Fatty infiltration of the liver is noted. The spleen and pancreas appear normal. Adrenal glands appear normal. Nonobstructive calculus is noted in midpole collecting system of right kidney. No hydronephrosis or renal obstruction is noted. No ureteral calculi are  noted. The patient is reportedly status post appendectomy. There is severe focal diverticulitis involving the proximal sigmoid colon with multiple gas collections seen in the surrounding peritoneal fat consistent with perforation. Some free fluid is seen adjacent to this. Urinary bladder appears normal. No significant adenopathy is noted  IMPRESSION: Fatty infiltration of the liver.  Nonobstructive right renal calculus.  Severe focal diverticulitis of proximal sigmoid colon is noted, with gas noted in the surrounding peritoneal fat consistent with perforation. Some free fluid is seen in this area as well.   Electronically Signed   By: JMarijo Conception M.D.   On: 07/30/2014 16:34    Review of Systems  Constitutional: Negative for fever and chills.  Gastrointestinal: Positive for abdominal pain. Negative for nausea, vomiting and blood in stool.  Genitourinary: Positive for dysuria and hematuria (darkurine the past several days.).       No pneumaturia  All other systems reviewed and are negative.   Blood pressure 147/78, pulse 89, temperature 101.9 F (38.8 C), temperature source Oral, resp. rate 18, height '5\' 10"'  (1.778 m), weight 122.471 kg (270 lb), SpO2 95 %. Physical Exam  Constitutional: He is oriented to person, place, and time. He appears well-developed.  Obese  HENT:  Head: Normocephalic and atraumatic.  Eyes: Conjunctivae and EOM are normal. Pupils are equal, round, and reactive to light.  Neck: Normal range of motion. Neck supple.  Cardiovascular: Normal rate, regular rhythm and normal heart sounds.   Respiratory: Effort normal and breath sounds normal.  GI: Soft. Bowel sounds are normal. He exhibits distension. There is tenderness in the left upper quadrant. There is rebound and guarding. There is no rigidity and no CVA tenderness.  No suprapubic tenderness  Genitourinary: Prostate normal and penis normal.  Musculoskeletal: Normal range of motion.  Neurological: He is alert and  oriented to person, place, and time.  Skin: Skin is warm and dry.  Psychiatric: He has a normal mood and affect. His behavior is normal. Thought content normal.     Assessment/Plan Acute diverticulitis with microperforation into the mesentry and pericolonic. Urinary tract infection. Increased creatinine.  Admit for pain control, IV hydration and IV antibiotic.  Kilynn Fitzsimmons 07/30/2014, 5:37 PM

## 2014-07-30 NOTE — ED Notes (Signed)
Pt denies n/v/d 

## 2014-07-31 LAB — BASIC METABOLIC PANEL
Anion gap: 6 (ref 5–15)
BUN: 12 mg/dL (ref 6–20)
CO2: 27 mmol/L (ref 22–32)
Calcium: 7.9 mg/dL — ABNORMAL LOW (ref 8.9–10.3)
Chloride: 104 mmol/L (ref 101–111)
Creatinine, Ser: 1.37 mg/dL — ABNORMAL HIGH (ref 0.61–1.24)
GFR calc Af Amer: >60 mL/min (ref >60)
Glucose, Bld: 108 mg/dL — ABNORMAL HIGH (ref 65–99)
Potassium: 3.6 mmol/L (ref 3.5–5.1)
SODIUM: 137 mmol/L (ref 135–145)

## 2014-07-31 LAB — CBC
HEMATOCRIT: 36.6 % — AB (ref 39.0–52.0)
HEMOGLOBIN: 12.3 g/dL — AB (ref 13.0–17.0)
MCH: 28.6 pg (ref 26.0–34.0)
MCHC: 33.6 g/dL (ref 30.0–36.0)
MCV: 85.1 fL (ref 78.0–100.0)
Platelets: 235 10*3/uL (ref 150–400)
RBC: 4.3 MIL/uL (ref 4.22–5.81)
RDW: 13.9 % (ref 11.5–15.5)
WBC: 12.7 10*3/uL — ABNORMAL HIGH (ref 4.0–10.5)

## 2014-07-31 NOTE — Progress Notes (Signed)
Subjective: He reports that his abdominal pain is less  Objective: Vital signs in last 24 hours: Temp:  [98.8 F (37.1 C)-101.9 F (38.8 C)] 99.3 F (37.4 C) (07/15 0554) Pulse Rate:  [81-98] 90 (07/15 0550) Resp:  [16-18] 16 (07/15 0550) BP: (130-156)/(76-91) 137/82 mmHg (07/15 0550) SpO2:  [93 %-100 %] 100 % (07/15 0550) Weight:  [122.471 kg (270 lb)] 122.471 kg (270 lb) (07/14 0957) Last BM Date: 07/27/14  Intake/Output from previous day: 07/14 0701 - 07/15 0700 In: 1575 [I.V.:1575] Out: 100 [Urine:100] Intake/Output this shift:    Abdomen still with moderate LLQ tenderness and guarding.  No frank peritonitis  Lab Results:   Recent Labs  07/30/14 1000  WBC 16.7*  HGB 13.6  HCT 40.0  PLT 223   BMET  Recent Labs  07/30/14 1000  NA 136  K 3.6  CL 105  CO2 24  GLUCOSE 149*  BUN 14  CREATININE 1.52*  CALCIUM 8.6*   PT/INR No results for input(s): LABPROT, INR in the last 72 hours. ABG No results for input(s): PHART, HCO3 in the last 72 hours.  Invalid input(s): PCO2, PO2  Studies/Results: Ct Abdomen Pelvis W Contrast  07/30/2014   CLINICAL DATA:  Acute left lower quadrant abdominal pain and fever.  EXAM: CT ABDOMEN AND PELVIS WITH CONTRAST  TECHNIQUE: Multidetector CT imaging of the abdomen and pelvis was performed using the standard protocol following bolus administration of intravenous contrast.  CONTRAST:  100mL OMNIPAQUE IOHEXOL 300 MG/ML  SOLN  COMPARISON:  None.  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 Visualized lung bases appear normal.  No gallstones are noted. Fatty infiltration of the liver is noted. The spleen and pancreas appear normal. Adrenal glands appear normal. Nonobstructive calculus is noted in midpole collecting system of right kidney. No hydronephrosis or renal obstruction is noted. No ureteral calculi are noted. The patient is reportedly status post appendectomy. There is severe focal  diverticulitis involving the proximal sigmoid colon with multiple gas collections seen in the surrounding peritoneal fat consistent with perforation. Some free fluid is seen adjacent to this. Urinary bladder appears normal. No significant adenopathy is noted  IMPRESSION: Fatty infiltration of the liver.  Nonobstructive right renal calculus.  Severe focal diverticulitis of proximal sigmoid colon is noted, with gas noted in the surrounding peritoneal fat consistent with perforation. Some free fluid is seen in this area as well.   Electronically Signed   By: Lupita RaiderJames  Green Jr, M.D.   On: 07/30/2014 16:34    Anti-infectives: Anti-infectives    Start     Dose/Rate Route Frequency Ordered Stop   07/31/14 1745  cefTRIAXone (ROCEPHIN) 2 g in dextrose 5 % 50 mL IVPB - Premix     2 g 100 mL/hr over 30 Minutes Intravenous Every 24 hours 07/30/14 1829     07/30/14 1830  metroNIDAZOLE (FLAGYL) IVPB 500 mg     500 mg 100 mL/hr over 60 Minutes Intravenous Every 8 hours 07/30/14 1819     07/30/14 1745  cefTRIAXone (ROCEPHIN) 1 g in dextrose 5 % 50 mL IVPB  Status:  Discontinued     1 g 100 mL/hr over 30 Minutes Intravenous Every 24 hours 07/30/14 1734 07/30/14 1829   07/30/14 1630  metroNIDAZOLE (FLAGYL) IVPB 500 mg     500 mg 100 mL/hr over 60 Minutes Intravenous  Once 07/30/14 1628 07/30/14 1748   07/30/14 1430  cefTRIAXone (ROCEPHIN) 1 g in dextrose 5 % 50 mL IVPB  1 g 100 mL/hr over 30 Minutes Intravenous  Once 07/30/14 1425 07/30/14 1513   07/30/14 0000  cephALEXin (KEFLEX) 500 MG capsule     500 mg Oral 4 times daily 07/30/14 1614        Assessment/Plan:  Diverticulitis with microperforation  Will continue bowel rest and IV antibiotics.  CBC pending. Hopefully this will continue to improve without the need for a laparotomy and colostomy  LOS: 1 day    Bryan [name] A 07/31/2014 4

## 2014-07-31 NOTE — Progress Notes (Signed)
Utilization review completed. Veretta Sabourin, RN, BSN. 

## 2014-08-01 LAB — URINE CULTURE: Culture: NO GROWTH

## 2014-08-01 LAB — CBC
HCT: 33.8 % — ABNORMAL LOW (ref 39.0–52.0)
Hemoglobin: 11.3 g/dL — ABNORMAL LOW (ref 13.0–17.0)
MCH: 28.9 pg (ref 26.0–34.0)
MCHC: 33.4 g/dL (ref 30.0–36.0)
MCV: 86.4 fL (ref 78.0–100.0)
PLATELETS: 249 10*3/uL (ref 150–400)
RBC: 3.91 MIL/uL — ABNORMAL LOW (ref 4.22–5.81)
RDW: 14.1 % (ref 11.5–15.5)
WBC: 12 10*3/uL — AB (ref 4.0–10.5)

## 2014-08-01 NOTE — Progress Notes (Signed)
Patient ID: Bryan LeschChristopher J [name], male   DOB: 10/20/1970, 44 y.o.   MRN: [id number]  [facility]  HD#: 3  Subjective: Patient comfortable, less pain.  No nausea or emesis.  Ice chips only.  BM last evening.  Objective: Vital signs in last 24 hours: Temp:  [98 F (36.7 C)-99.1 F (37.3 C)] 98.3 F (36.8 C) (07/16 0453) Pulse Rate:  [86-91] 91 (07/16 0453) Resp:  [17-18] 17 (07/16 0453) BP: (120-127)/(73-76) 122/73 mmHg (07/16 0453) SpO2:  [93 %-98 %] 93 % (07/16 0453) Last BM Date: 07/31/14  Intake/Output from previous day: 07/15 0701 - 07/16 0700 In: 1000 [I.V.:1000] Out: 1 [Stool:1] Intake/Output this shift:    Physical Exam: HEENT - sclerae clear, mucous membranes moist Neck - soft Chest - clear bilaterally Cor - RRR Abdomen - soft, no distension; BS present; mild tenderness LLQ, no guarding or mass Ext - no edema, non-tender Neuro - alert & oriented, no focal deficits  Lab Results:   Recent Labs  07/31/14 0910 08/01/14 0450  WBC 12.7* 12.0*  HGB 12.3* 11.3*  HCT 36.6* 33.8*  PLT 235 249   BMET  Recent Labs  07/30/14 1000 07/31/14 0910  NA 136 137  K 3.6 3.6  CL 105 104  CO2 24 27  GLUCOSE 149* 108*  BUN 14 12  CREATININE 1.52* 1.37*  CALCIUM 8.6* 7.9*   PT/INR No results for input(s): LABPROT, INR in the last 72 hours. Comprehensive Metabolic Panel:    Component Value Date/Time   NA 137 07/31/2014 0910   NA 136 07/30/2014 1000   K 3.6 07/31/2014 0910   K 3.6 07/30/2014 1000   CL 104 07/31/2014 0910   CL 105 07/30/2014 1000   CO2 27 07/31/2014 0910   CO2 24 07/30/2014 1000   BUN 12 07/31/2014 0910   BUN 14 07/30/2014 1000   CREATININE 1.37* 07/31/2014 0910   CREATININE 1.52* 07/30/2014 1000   GLUCOSE 108* 07/31/2014 0910   GLUCOSE 149* 07/30/2014 1000   CALCIUM 7.9* 07/31/2014 0910   CALCIUM 8.6* 07/30/2014 1000   AST 14* 07/30/2014 1000   ALT 18 07/30/2014 1000   ALKPHOS 53 07/30/2014 1000   BILITOT 0.8 07/30/2014 1000   PROT 6.9 07/30/2014 1000   ALBUMIN 3.3* 07/30/2014 1000    Studies/Results: Ct Abdomen Pelvis W Contrast  07/30/2014   CLINICAL DATA:  Acute left lower quadrant abdominal pain and fever.  EXAM: CT ABDOMEN AND PELVIS WITH CONTRAST  TECHNIQUE: Multidetector CT imaging of the abdomen and pelvis was performed using the standard protocol following bolus administration of intravenous contrast.  CONTRAST:  100mL OMNIPAQUE IOHEXOL 300 MG/ML  SOLN  COMPARISON:  None.  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 Visualized lung bases appear normal.  No gallstones are noted. Fatty infiltration of the liver is noted. The spleen and pancreas appear normal. Adrenal glands appear normal. Nonobstructive calculus is noted in midpole collecting system of right kidney. No hydronephrosis or renal obstruction is noted. No ureteral calculi are noted. The patient is reportedly status post appendectomy. There is severe focal diverticulitis involving the proximal sigmoid colon with multiple gas collections seen in the surrounding peritoneal fat consistent with perforation. Some free fluid is seen adjacent to this. Urinary bladder appears normal. No significant adenopathy is noted  IMPRESSION: Fatty infiltration of the liver.  Nonobstructive right renal calculus.  Severe focal diverticulitis of proximal sigmoid colon is noted, with gas noted in the  surrounding peritoneal fat consistent with perforation. Some free fluid is seen in this area as well.   Electronically Signed   By: Lupita Raider, M.D.   On: 07/30/2014 16:34    Anti-infectives: Anti-infectives    Start     Dose/Rate Route Frequency Ordered Stop   07/31/14 1745  cefTRIAXone (ROCEPHIN) 2 g in dextrose 5 % 50 mL IVPB - Premix     2 g 100 mL/hr over 30 Minutes Intravenous Every 24 hours 07/30/14 1829     07/30/14 1830  metroNIDAZOLE (FLAGYL) IVPB 500 mg     500 mg 100 mL/hr over 60 Minutes  Intravenous Every 8 hours 07/30/14 1819     07/30/14 1745  cefTRIAXone (ROCEPHIN) 1 g in dextrose 5 % 50 mL IVPB  Status:  Discontinued     1 g 100 mL/hr over 30 Minutes Intravenous Every 24 hours 07/30/14 1734 07/30/14 1829   07/30/14 1630  metroNIDAZOLE (FLAGYL) IVPB 500 mg     500 mg 100 mL/hr over 60 Minutes Intravenous  Once 07/30/14 1628 07/30/14 1748   07/30/14 1430  cefTRIAXone (ROCEPHIN) 1 g in dextrose 5 % 50 mL IVPB     1 g 100 mL/hr over 30 Minutes Intravenous  Once 07/30/14 1425 07/30/14 1513   07/30/14 0000  cephALEXin (KEFLEX) 500 MG capsule     500 mg Oral 4 times daily 07/30/14 1614        Assessment & Plans: Acute diverticulitis  Rocephin and Flagyl  Advance to clear liquid diet  Ambulate in halls  Repeat CBC in AM 7/17  Velora Heckler, MD, [facility] Office: [phone]   Bryan [name] 08/01/2014

## 2014-08-02 LAB — CBC
HCT: 34.7 % — ABNORMAL LOW (ref 39.0–52.0)
Hemoglobin: 11.8 g/dL — ABNORMAL LOW (ref 13.0–17.0)
MCH: 29.2 pg (ref 26.0–34.0)
MCHC: 34 g/dL (ref 30.0–36.0)
MCV: 85.9 fL (ref 78.0–100.0)
PLATELETS: 264 10*3/uL (ref 150–400)
RBC: 4.04 MIL/uL — AB (ref 4.22–5.81)
RDW: 13.9 % (ref 11.5–15.5)
WBC: 11.6 10*3/uL — ABNORMAL HIGH (ref 4.0–10.5)

## 2014-08-02 NOTE — Progress Notes (Signed)
Patient ID: Bryan LeschChristopher J [name], male   DOB: 12/23/1970, 44 y.o.   MRN: [id number]  General Surgery - [facility]  HD#: 4  Subjective: Patient with less pain.  Ambulatory.  Tolerating po clear liquids.  Objective: Vital signs in last 24 hours: Temp:  [98.7 F (37.1 C)-99.1 F (37.3 C)] 99 F (37.2 C) (07/17 0509) Pulse Rate:  [80-89] 82 (07/17 0509) Resp:  [17-18] 18 (07/17 0509) BP: (120-124)/(58-70) 120/58 mmHg (07/17 0509) SpO2:  [97 %-98 %] 97 % (07/17 0509) Last BM Date: 07/31/14  Intake/Output from previous day: 07/16 0701 - 07/17 0700 In: 2140 [P.O.:1640; I.V.:500] Out: -  Intake/Output this shift:    Physical Exam: HEENT - sclerae clear, mucous membranes moist Neck - soft Chest - clear bilaterally Cor - RRR Abdomen - soft, minimal tenderness LLQ; no mass; no guarding Ext - no edema, non-tender Neuro - alert & oriented, no focal deficits  Lab Results:   Recent Labs  08/01/14 0450 08/02/14 0425  WBC 12.0* 11.6*  HGB 11.3* 11.8*  HCT 33.8* 34.7*  PLT 249 264   BMET  Recent Labs  07/30/14 1000 07/31/14 0910  NA 136 137  K 3.6 3.6  CL 105 104  CO2 24 27  GLUCOSE 149* 108*  BUN 14 12  CREATININE 1.52* 1.37*  CALCIUM 8.6* 7.9*   PT/INR No results for input(s): LABPROT, INR in the last 72 hours. Comprehensive Metabolic Panel:    Component Value Date/Time   NA 137 07/31/2014 0910   NA 136 07/30/2014 1000   K 3.6 07/31/2014 0910   K 3.6 07/30/2014 1000   CL 104 07/31/2014 0910   CL 105 07/30/2014 1000   CO2 27 07/31/2014 0910   CO2 24 07/30/2014 1000   BUN 12 07/31/2014 0910   BUN 14 07/30/2014 1000   CREATININE 1.37* 07/31/2014 0910   CREATININE 1.52* 07/30/2014 1000   GLUCOSE 108* 07/31/2014 0910   GLUCOSE 149* 07/30/2014 1000   CALCIUM 7.9* 07/31/2014 0910   CALCIUM 8.6* 07/30/2014 1000   AST 14* 07/30/2014 1000   ALT 18 07/30/2014 1000   ALKPHOS 53 07/30/2014 1000   BILITOT 0.8 07/30/2014 1000   PROT 6.9  07/30/2014 1000   ALBUMIN 3.3* 07/30/2014 1000    Studies/Results: No results found.  Anti-infectives: Anti-infectives    Start     Dose/Rate Route Frequency Ordered Stop   07/31/14 1745  cefTRIAXone (ROCEPHIN) 2 g in dextrose 5 % 50 mL IVPB - Premix     2 g 100 mL/hr over 30 Minutes Intravenous Every 24 hours 07/30/14 1829     07/30/14 1830  metroNIDAZOLE (FLAGYL) IVPB 500 mg     500 mg 100 mL/hr over 60 Minutes Intravenous Every 8 hours 07/30/14 1819     07/30/14 1745  cefTRIAXone (ROCEPHIN) 1 g in dextrose 5 % 50 mL IVPB  Status:  Discontinued     1 g 100 mL/hr over 30 Minutes Intravenous Every 24 hours 07/30/14 1734 07/30/14 1829   07/30/14 1630  metroNIDAZOLE (FLAGYL) IVPB 500 mg     500 mg 100 mL/hr over 60 Minutes Intravenous  Once 07/30/14 1628 07/30/14 1748   07/30/14 1430  cefTRIAXone (ROCEPHIN) 1 g in dextrose 5 % 50 mL IVPB     1 g 100 mL/hr over 30 Minutes Intravenous  Once 07/30/14 1425 07/30/14 1513   07/30/14 0000  cephALEXin (KEFLEX) 500 MG capsule     500 mg Oral 4 times daily 07/30/14 1614  Assessment & Plans: Acute diverticulitis Rocephin and Flagyl Clear liquid diet - decrease IVF rate Ambulating in halls Repeat CBC in AM 7/18  Bryan Heckler, MD, [facility] Office: [phone]   Bryan [name] Judie Petit 08/02/2014

## 2014-08-03 LAB — CBC
HEMATOCRIT: 35.3 % — AB (ref 39.0–52.0)
Hemoglobin: 11.9 g/dL — ABNORMAL LOW (ref 13.0–17.0)
MCH: 28 pg (ref 26.0–34.0)
MCHC: 33.7 g/dL (ref 30.0–36.0)
MCV: 83.1 fL (ref 78.0–100.0)
Platelets: 286 10*3/uL (ref 150–400)
RBC: 4.25 MIL/uL (ref 4.22–5.81)
RDW: 13.8 % (ref 11.5–15.5)
WBC: 11.6 10*3/uL — AB (ref 4.0–10.5)

## 2014-08-03 NOTE — Progress Notes (Signed)
Patient ID: Bryan [name], male   DOB: 07/25/1970, 44 y.o.   MRN: [id number]    Subjective: Pt feels better.  Tolerating clear liquids with no issues  Objective: Vital signs in last 24 hours: Temp:  [98.1 F (36.7 C)-99.1 F (37.3 C)] 99 F (37.2 C) (07/18 0549) Pulse Rate:  [58-85] 85 (07/18 0549) Resp:  [18] 18 (07/18 0549) BP: (115-133)/(51-76) 119/69 mmHg (07/18 0549) SpO2:  [95 %-99 %] 96 % (07/18 0549) Last BM Date: 08/02/14  Intake/Output from previous day: 07/17 0701 - 07/18 0700 In: 2360 [P.O.:1560; I.V.:800] Out: -  Intake/Output this shift:    PE: Abd: soft, minimally tender in LLQ, otherwise NT, obese, +BS Heart: regular Lungs: CTAB  Lab Results:   Recent Labs  08/02/14 0425 08/03/14 0422  WBC 11.6* 11.6*  HGB 11.8* 11.9*  HCT 34.7* 35.3*  PLT 264 286   BMET  Recent Labs  07/31/14 0910  NA 137  K 3.6  CL 104  CO2 27  GLUCOSE 108*  BUN 12  CREATININE 1.37*  CALCIUM 7.9*   PT/INR No results for input(s): LABPROT, INR in the last 72 hours. CMP     Component Value Date/Time   NA 137 07/31/2014 0910   K 3.6 07/31/2014 0910   CL 104 07/31/2014 0910   CO2 27 07/31/2014 0910   GLUCOSE 108* 07/31/2014 0910   BUN 12 07/31/2014 0910   CREATININE 1.37* 07/31/2014 0910   CALCIUM 7.9* 07/31/2014 0910   PROT 6.9 07/30/2014 1000   ALBUMIN 3.3* 07/30/2014 1000   AST 14* 07/30/2014 1000   ALT 18 07/30/2014 1000   ALKPHOS 53 07/30/2014 1000   BILITOT 0.8 07/30/2014 1000   GFRNONAA >60 07/31/2014 0910   GFRAA >60 07/31/2014 0910   Lipase     Component Value Date/Time   LIPASE 19* 07/30/2014 1000       Studies/Results: No results found.  Anti-infectives: Anti-infectives    Start     Dose/Rate Route Frequency Ordered Stop   07/31/14 1745  cefTRIAXone (ROCEPHIN) 2 g in dextrose 5 % 50 mL IVPB - Premix     2 g 100 mL/hr over 30 Minutes Intravenous Every 24 hours 07/30/14 1829     07/30/14 1830  metroNIDAZOLE (FLAGYL) IVPB 500 mg      500 mg 100 mL/hr over 60 Minutes Intravenous Every 8 hours 07/30/14 1819     07/30/14 1745  cefTRIAXone (ROCEPHIN) 1 g in dextrose 5 % 50 mL IVPB  Status:  Discontinued     1 g 100 mL/hr over 30 Minutes Intravenous Every 24 hours 07/30/14 1734 07/30/14 1829   07/30/14 1630  metroNIDAZOLE (FLAGYL) IVPB 500 mg     500 mg 100 mL/hr over 60 Minutes Intravenous  Once 07/30/14 1628 07/30/14 1748   07/30/14 1430  cefTRIAXone (ROCEPHIN) 1 g in dextrose 5 % 50 mL IVPB     1 g 100 mL/hr over 30 Minutes Intravenous  Once 07/30/14 1425 07/30/14 1513   07/30/14 0000  cephALEXin (KEFLEX) 500 MG capsule     500 mg Oral 4 times daily 07/30/14 1614         Assessment/Plan   1. Acute diverticulitis with microperforation -cont IV rocephin and Flagy D4/? -advance to full liquids today -mobilize and pulm toilet -WBC stable at 11.6K, will recheck in the am and make sure it's continuing to decrease prior to transition to orals and dc home.  LOS: 4 days    Taeshaun Rames E 08/03/2014, 8:10  AM Pager: [phone]

## 2014-08-04 LAB — CBC
HCT: 36.3 % — ABNORMAL LOW (ref 39.0–52.0)
Hemoglobin: 12 g/dL — ABNORMAL LOW (ref 13.0–17.0)
MCH: 27.4 pg (ref 26.0–34.0)
MCHC: 33.1 g/dL (ref 30.0–36.0)
MCV: 82.9 fL (ref 78.0–100.0)
Platelets: 290 10*3/uL (ref 150–400)
RBC: 4.38 MIL/uL (ref 4.22–5.81)
RDW: 13.6 % (ref 11.5–15.5)
WBC: 11.7 10*3/uL — ABNORMAL HIGH (ref 4.0–10.5)

## 2014-08-04 MED ORDER — OXYCODONE-ACETAMINOPHEN 5-325 MG PO TABS: 1.0000 | ORAL_TABLET | ORAL | Status: DC | PRN

## 2014-08-04 MED ORDER — OXYCODONE-ACETAMINOPHEN 5-325 MG PO TABS
1.0000 | ORAL_TABLET | ORAL | Status: DC | PRN
  Administered 2014-08-04: 2 via ORAL
  Filled 2014-08-04: qty 2

## 2014-08-04 MED ORDER — AMOXICILLIN-POT CLAVULANATE 875-125 MG PO TABS
1.0000 | ORAL_TABLET | Freq: Two times a day (BID) | ORAL | Status: DC
  Administered 2014-08-04: 1 via ORAL
  Filled 2014-08-04: qty 1

## 2014-08-04 MED ORDER — AMOXICILLIN-POT CLAVULANATE 875-125 MG PO TABS: 1.0000 | ORAL_TABLET | Freq: Two times a day (BID) | ORAL | Status: DC

## 2014-08-04 NOTE — Care Management Note (Signed)
Case Management Note  Patient Details  Name: Bryan LeschChristopher J [name] MRN: [id number] Date of Birth: 06/12/1970  Subjective/Objective:                    Action/Plan: UR updated   Expected Discharge Date:                  Expected Discharge Plan:  Home/Self Care  In-House Referral:     Discharge planning Services     Post Acute Care Choice:    Choice offered to:     DME Arranged:    DME Agency:     HH Arranged:    HH Agency:     Status of Service:  Completed, signed off  Medicare Important Message Given:    Date Medicare IM Given:    Medicare IM give by:    Date Additional Medicare IM Given:    Additional Medicare Important Message give by:     If discussed at Long Length of Stay Meetings, dates discussed:    Additional Comments:  [name], [name], RN 08/04/2014, 11:29 AM

## 2014-08-04 NOTE — Progress Notes (Signed)
Discharge paperwork given to patient. No questions verbalized. Patient ready for discharge. 

## 2014-08-04 NOTE — Discharge Instructions (Signed)
Diverticulitis Diverticulitis is inflammation or infection of small pouches in your colon that form when you have a condition called diverticulosis. The pouches in your colon are called diverticula. Your colon, or large intestine, is where water is absorbed and stool is formed. Complications of diverticulitis can include:  Bleeding.  Severe infection.  Severe pain.  Perforation of your colon.  Obstruction of your colon. CAUSES  Diverticulitis is caused by bacteria. Diverticulitis happens when stool becomes trapped in diverticula. This allows bacteria to grow in the diverticula, which can lead to inflammation and infection. RISK FACTORS People with diverticulosis are at risk for diverticulitis. Eating a diet that does not include enough fiber from fruits and vegetables may make diverticulitis more likely to develop. SYMPTOMS  Symptoms of diverticulitis may include:  Abdominal pain and tenderness. The pain is normally located on the left side of the abdomen, but may occur in other areas.  Fever and chills.  Bloating.  Cramping.  Nausea.  Vomiting.  Constipation.  Diarrhea.  Blood in your stool. DIAGNOSIS  Your health care provider will ask you about your medical history and do a physical exam. You may need to have tests done because many medical conditions can cause the same symptoms as diverticulitis. Tests may include:  Blood tests.  Urine tests.  Imaging tests of the abdomen, including X-rays and CT scans. When your condition is under control, your health care provider may recommend that you have a colonoscopy. A colonoscopy can show how severe your diverticula are and whether something else is causing your symptoms. TREATMENT  Most cases of diverticulitis are mild and can be treated at home. Treatment may include:  Taking over-the-counter pain medicines.  Following a clear liquid diet.  Taking antibiotic medicines by mouth for 7-10 days. More severe cases may  be treated at a hospital. Treatment may include:  Not eating or drinking.  Taking prescription pain medicine.  Receiving antibiotic medicines through an IV tube.  Receiving fluids and nutrition through an IV tube.  Surgery. HOME CARE INSTRUCTIONS   Follow your health care provider's instructions carefully.  Follow a full liquid diet or other diet as directed by your health care provider. After your symptoms improve, your health care provider may tell you to change your diet. He or she may recommend you eat a high-fiber diet. Fruits and vegetables are good sources of fiber. Fiber makes it easier to pass stool.  Take fiber supplements or probiotics as directed by your health care provider.  Only take medicines as directed by your health care provider.  Keep all your follow-up appointments. SEEK MEDICAL CARE IF:   Your pain does not improve.  You have a hard time eating food.  Your bowel movements do not return to normal. SEEK IMMEDIATE MEDICAL CARE IF:   Your pain becomes worse.  Your symptoms do not get better.  Your symptoms suddenly get worse.  You have a fever.  You have repeated vomiting.  You have bloody or black, tarry stools. MAKE SURE YOU:   Understand these instructions.  Will watch your condition.  Will get help right away if you are not doing well or get worse. Document Released: 10/12/2004 Document Revised: 01/07/2013 Document Reviewed: 11/27/2012 [facility] Patient Information 2015 [place], [place]. This information is not intended to replace advice given to you by your health care provider. Make sure you discuss any questions you have with your health care provider.  Low-Fiber Diet x 2-3 weeks, then switch to high fiber Fiber is found  in fruits, vegetables, and whole grains. A low-fiber diet restricts fibrous foods that are not digested in the small intestine. A diet containing about 10-15 grams of fiber per day is considered low fiber. Low-fiber diets  may be used to:  Promote healing and rest the bowel during intestinal flare-ups.  Prevent blockage of a partially obstructed or narrowed gastrointestinal tract.  Reduce fecal weight and volume.  Slow the movement of feces. You may be on a low-fiber diet as a transitional diet following surgery, after an injury (trauma), or because of a short (acute) or lifelong (chronic) illness. Your health care provider will determine the length of time you need to stay on this diet.  WHAT DO I NEED TO KNOW ABOUT A LOW-FIBER DIET? Always check the fiber content on the packaging's Nutrition Facts label, especially on foods from the grains list. Ask your dietitian if you have questions about specific foods that are related to your condition, especially if the food is not listed below. In general, a low-fiber food will have less than 2 g of fiber. WHAT FOODS CAN I EAT? Grains All breads and crackers made with white flour. Sweet rolls, doughnuts, waffles, pancakes, [place] toast, bagels. Pretzels, Melba toast, zwieback. Well-cooked cereals, such as cornmeal, farina, or cream cereals. Dry cereals that do not contain whole grains, fruit, or nuts, such as refined corn, wheat, rice, and oat cereals. Potatoes prepared any way without skins, plain pastas and noodles, refined white rice. Use white flour for baking and making sauces. Use allowed list of grains for casseroles, dumplings, and puddings.  Vegetables Strained tomato and vegetable juices. Fresh lettuce, cucumber, spinach. Well-cooked (no skin or pulp) or canned vegetables, such as asparagus, bean sprouts, beets, carrots, green beans, mushrooms, potatoes, pumpkin, spinach, yellow squash, tomato sauce/puree, turnips, yams, and zucchini. Keep servings limited to  cup.  Fruits All fruit juices except prune juice. Cooked or canned fruits without skin and seeds, such as applesauce, apricots, cherries, fruit cocktail, grapefruit, grapes, mandarin oranges, melons,  peaches, pears, pineapple, and plums. Fresh fruits without skin, such as apricots, avocados, bananas, melons, pineapple, nectarines, and peaches. Keep servings limited to  cup or 1 piece.  Meat and Other Protein Sources Ground or well-cooked tender beef, ham, veal, lamb, pork, or poultry. Eggs, plain cheese. Fish, oysters, shrimp, lobster, and other seafood. Liver, organ meats. Smooth nut butters. Dairy All milk products and alternative dairy substitutes, such as soy, rice, almond, and coconut, not containing added whole nuts, seeds, or added fruit. Beverages Decaf coffee, fruit, and vegetable juices or smoothies (small amounts, with no pulp or skins, and with fruits from allowed list), sports drinks, herbal tea. Condiments Ketchup, mustard, vinegar, cream sauce, cheese sauce, cocoa powder. Spices in moderation, such as allspice, basil, bay leaves, celery powder or leaves, cinnamon, cumin powder, curry powder, ginger, mace, marjoram, onion or garlic powder, oregano, paprika, parsley flakes, ground pepper, rosemary, sage, savory, tarragon, thyme, and turmeric. Sweets and Desserts Plain cakes and cookies, pie made with allowed fruit, pudding, custard, cream pie. Gelatin, fruit, ice, sherbet, frozen ice pops. Ice cream, ice milk without nuts. Plain hard candy, honey, jelly, molasses, syrup, sugar, chocolate syrup, gumdrops, marshmallows. Limit overall sugar intake.  Fats and Oil Margarine, butter, cream, mayonnaise, salad oils, plain salad dressings made from allowed foods. Choose healthy fats such as olive oil, canola oil, and omega-3 fatty acids (such as found in salmon or tuna) when possible.  Other Bouillon, broth, or cream soups made from allowed foods. Any  strained soup. Casseroles or mixed dishes made with allowed foods. The items listed above may not be a complete list of recommended foods or beverages. Contact your dietitian for more options.  WHAT FOODS ARE NOT RECOMMENDED? Grains All  whole wheat and whole grain breads and crackers. Multigrains, rye, bran seeds, nuts, or coconut. Cereals containing whole grains, multigrains, bran, coconut, nuts, raisins. Cooked or dry oatmeal, steel-cut oats. Coarse wheat cereals, granola. Cereals advertised as high fiber. Potato skins. Whole grain pasta, wild or brown rice. Popcorn. Coconut flour. Bran, buckwheat, corn bread, multigrains, rye, wheat germ.  Vegetables Fresh, cooked or canned vegetables, such as artichokes, asparagus, beet greens, broccoli, Brussels sprouts, cabbage, celery, cauliflower, corn, eggplant, kale, legumes or beans, okra, peas, and tomatoes. Avoid large servings of any vegetables, especially raw vegetables.  Fruits Fresh fruits, such as apples with or without skin, berries, cherries, figs, grapes, grapefruit, guavas, kiwis, mangoes, oranges, papayas, pears, persimmons, pineapple, and pomegranate. Prune juice and juices with pulp, stewed or dried prunes. Dried fruits, dates, raisins. Fruit seeds or skins. Avoid large servings of all fresh fruits. Meats and Other Protein Sources Tough, fibrous meats with gristle. Chunky nut butter. Cheese made with seeds, nuts, or other foods not recommended. Nuts, seeds, legumes (beans, including baked beans), dried peas, beans, lentils.  Dairy Yogurt or cheese that contains nuts, seeds, or added fruit.  Beverages Fruit juices with high pulp, prune juice. Caffeinated coffee and teas.  Condiments Coconut, maple syrup, pickles, olives. Sweets and Desserts Desserts, cookies, or candies that contain nuts or coconut, chunky peanut butter, dried fruits. Jams, preserves with seeds, marmalade. Large amounts of sugar and sweets. Any other dessert made with fruits from the not recommended list.  Other Soups made from vegetables that are not recommended or that contain other foods not recommended.  The items listed above may not be a complete list of foods and beverages to avoid. Contact your  dietitian for more information. Document Released: 06/24/2001 Document Revised: 01/07/2013 Document Reviewed: 11/25/2012 [facility] Patient Information 2015 [place]. This information is not intended to replace advice given to you by your health care provider. Make sure you discuss any questions you have with your health care provider.  High-Fiber Diet Fiber is found in fruits, vegetables, and grains. A high-fiber diet encourages the addition of more whole grains, legumes, fruits, and vegetables in your diet. The recommended amount of fiber for adult males is 38 g per day. For adult females, it is 25 g per day. Pregnant and lactating women should get 28 g of fiber per day. If you have a digestive or bowel problem, ask your caregiver for advice before adding high-fiber foods to your diet. Eat a variety of high-fiber foods instead of only a select few type of foods.  PURPOSE  To increase stool bulk.  To make bowel movements more regular to prevent constipation.  To lower cholesterol.  To prevent overeating. WHEN IS THIS DIET USED?  It may be used if you have constipation and hemorrhoids.  It may be used if you have uncomplicated diverticulosis (intestine condition) and irritable bowel syndrome.  It may be used if you need help with weight management.  It may be used if you want to add it to your diet as a protective measure against atherosclerosis, diabetes, and cancer. SOURCES OF FIBER  Whole-grain breads and cereals.  Fruits, such as apples, oranges, bananas, berries, prunes, and pears.  Vegetables, such as green peas, carrots, sweet potatoes, beets, broccoli, cabbage, spinach, and  artichokes.  Legumes, such split peas, soy, lentils.  Almonds. FIBER CONTENT IN FOODS Starches and Grains / Dietary Fiber (g)  Cheerios, 1 cup / 3 g  Corn Flakes cereal, 1 cup / 0.7 g  Rice crispy treat cereal, 1 cup / 0.3 g  Instant oatmeal (cooked),  cup / 2 g  Frosted wheat cereal, 1  cup / 5.1 g  Brown, long-grain rice (cooked), 1 cup / 3.5 g  White, long-grain rice (cooked), 1 cup / 0.6 g  Enriched macaroni (cooked), 1 cup / 2.5 g Legumes / Dietary Fiber (g)  Baked beans (canned, plain, or vegetarian),  cup / 5.2 g  Kidney beans (canned),  cup / 6.8 g  Pinto beans (cooked),  cup / 5.5 g Breads and Crackers / Dietary Fiber (g)  Plain or honey graham crackers, 2 squares / 0.7 g  Saltine crackers, 3 squares / 0.3 g  Plain, salted pretzels, 10 pieces / 1.8 g  Whole-wheat bread, 1 slice / 1.9 g  White bread, 1 slice / 0.7 g  Raisin bread, 1 slice / 1.2 g  Plain bagel, 3 oz / 2 g  Flour tortilla, 1 oz / 0.9 g  Corn tortilla, 1 small / 1.5 g  Hamburger or hotdog bun, 1 small / 0.9 g Fruits / Dietary Fiber (g)  Apple with skin, 1 medium / 4.4 g  Sweetened applesauce,  cup / 1.5 g  Banana,  medium / 1.5 g  Grapes, 10 grapes / 0.4 g  Orange, 1 small / 2.3 g  Raisin, 1.5 oz / 1.6 g  Melon, 1 cup / 1.4 g Vegetables / Dietary Fiber (g)  Green beans (canned),  cup / 1.3 g  Carrots (cooked),  cup / 2.3 g  Broccoli (cooked),  cup / 2.8 g  Peas (cooked),  cup / 4.4 g  Mashed potatoes,  cup / 1.6 g  Lettuce, 1 cup / 0.5 g  Corn (canned),  cup / 1.6 g  Tomato,  cup / 1.1 g Document Released: 01/02/2005 Document Revised: 07/04/2011 Document Reviewed: 04/06/2011 [employer] Patient Information 2015 [place], [place]. This information is not intended to replace advice given to you by your health care provider. Make sure you discuss any questions you have with your health care provider.

## 2014-08-04 NOTE — Discharge Summary (Signed)
Patient ID: Bryan LeschChristopher J [name] MRN: [id number] DOB/AGE: 46/09/1970 46 y.o.  Admit date: 07/30/2014 Discharge date: 08/04/2014  Procedures: none  Consults: None  Reason for Admission: Worsening abdominal pain since Tuesday, associated with left sided tenderness and a CT that shows acute diverticulititis with microperforation.  Admission Diagnoses:  Acute diverticulitis with microperforation into the mesentry and pericolonic. Urinary tract infection. Increased creatinine.  Hospital Course: The patient was admitted and placed on IV Rocephin and Flagyl for diverticulitis with microperforation and for a UTI.  The patient was initially NPO.  He began to improve as far as his pain was concerned.  His diet was able to be advanced to clears on HD3.  With continued improvement his diet was able to further be advanced.  He was still requiring a bit of IV pain medications.  This was converted to oral pain meds.  On HD 5, the patient was tolerating a low fiber diet and his pain was controlled with some oral pain meds.  He was felt stable for dc home.  PE: Abd: soft, minimally tender in LLQ, +BS, ND, obese Heart: regular Lungs: CTAB  Discharge Diagnoses:  Active Problems:   Acute diverticulitis with microperforation UTI    Discharge Medications:   Medication List    TAKE these medications        amoxicillin-clavulanate 875-125 MG per tablet  Commonly known as:  AUGMENTIN  Take 1 tablet by mouth every 12 (twelve) hours.     ibuprofen 200 MG tablet  Commonly known as:  ADVIL,MOTRIN  Take 200 mg by mouth every 6 (six) hours as needed for pain.     oxyCODONE-acetaminophen 5-325 MG per tablet  Commonly known as:  PERCOCET/ROXICET  Take 1-2 tablets by mouth every 4 (four) hours as needed for moderate pain.        Discharge Instructions:     Follow-up Information    Follow up with Pamelia HoitWILSON,FRED HENRY, MD.   [facility]  [facility]   Contact information:   [address]  [place] [address] [phone]       [facility], [facility]. Schedule an appointment as soon as possible for a visit in 2 weeks.   Specialty:  General Surgery   Contact information:   [address] [place] [address] [phone]       Signed: Letha CapeOSBORNE,Ginette Bradway E 08/04/2014, 8:59 AM

## 2014-08-21 ENCOUNTER — Emergency Department (HOSPITAL_COMMUNITY): Payer: BLUE CROSS/BLUE SHIELD

## 2014-08-21 ENCOUNTER — Inpatient Hospital Stay (HOSPITAL_COMMUNITY)
Admission: EM | Admit: 2014-08-21 | Discharge: 2014-08-31 | DRG: 330 | Disposition: A | Discharge status: Home Health | Payer: BLUE CROSS/BLUE SHIELD | Attending: Surgery | Admitting: Surgery

## 2014-08-21 ENCOUNTER — Encounter (HOSPITAL_COMMUNITY): Payer: Self-pay | Admitting: Emergency Medicine

## 2014-08-21 DIAGNOSIS — Z6838 Body mass index (BMI) 38.0-38.9, adult: Secondary | ICD-10-CM

## 2014-08-21 DIAGNOSIS — K5720 Diverticulitis of large intestine with perforation and abscess without bleeding: Secondary | ICD-10-CM | POA: Diagnosis not present

## 2014-08-21 DIAGNOSIS — K5669 Other intestinal obstruction: Secondary | ICD-10-CM | POA: Diagnosis present

## 2014-08-21 DIAGNOSIS — E669 Obesity, unspecified: Secondary | ICD-10-CM | POA: Diagnosis present

## 2014-08-21 DIAGNOSIS — R1032 Left lower quadrant pain: Secondary | ICD-10-CM | POA: Diagnosis not present

## 2014-08-21 DIAGNOSIS — N179 Acute kidney failure, unspecified: Secondary | ICD-10-CM | POA: Diagnosis present

## 2014-08-21 LAB — URINE MICROSCOPIC-ADD ON

## 2014-08-21 LAB — COMPREHENSIVE METABOLIC PANEL
ALBUMIN: 3.6 g/dL (ref 3.5–5.0)
ALT: 24 U/L (ref 17–63)
AST: 18 U/L (ref 15–41)
Alkaline Phosphatase: 56 U/L (ref 38–126)
Anion gap: 9 (ref 5–15)
BILIRUBIN TOTAL: 1 mg/dL (ref 0.3–1.2)
BUN: 12 mg/dL (ref 6–20)
CHLORIDE: 104 mmol/L (ref 101–111)
CO2: 26 mmol/L (ref 22–32)
CREATININE: 1.57 mg/dL — AB (ref 0.61–1.24)
Calcium: 9.1 mg/dL (ref 8.9–10.3)
GFR, EST AFRICAN AMERICAN: 59 mL/min — AB (ref >60)
GFR, EST NON AFRICAN AMERICAN: 51 mL/min — AB (ref >60)
GLUCOSE: 118 mg/dL — AB (ref 65–99)
Potassium: 3.9 mmol/L (ref 3.5–5.1)
SODIUM: 139 mmol/L (ref 135–145)
TOTAL PROTEIN: 8.3 g/dL — AB (ref 6.5–8.1)

## 2014-08-21 LAB — URINALYSIS, ROUTINE W REFLEX MICROSCOPIC
Bilirubin Urine: NEGATIVE
GLUCOSE, UA: NEGATIVE mg/dL
Ketones, ur: 15 mg/dL — AB
Nitrite: NEGATIVE
Protein, ur: 100 mg/dL — AB
Specific Gravity, Urine: 1.018 (ref 1.005–1.030)
Urobilinogen, UA: 0.2 mg/dL (ref 0.0–1.0)
pH: 5.5 (ref 5.0–8.0)

## 2014-08-21 LAB — CBC
HCT: 41.7 % (ref 39.0–52.0)
HEMOGLOBIN: 14.1 g/dL (ref 13.0–17.0)
MCH: 27.8 pg (ref 26.0–34.0)
MCHC: 33.8 g/dL (ref 30.0–36.0)
MCV: 82.2 fL (ref 78.0–100.0)
Platelets: 206 10*3/uL (ref 150–400)
RBC: 5.07 MIL/uL (ref 4.22–5.81)
RDW: 14 % (ref 11.5–15.5)
WBC: 12 10*3/uL — ABNORMAL HIGH (ref 4.0–10.5)

## 2014-08-21 LAB — LIPASE, BLOOD: LIPASE: 27 U/L (ref 22–51)

## 2014-08-21 MED ORDER — ONDANSETRON HCL 4 MG/2ML IJ SOLN: 4.0000 mg | Freq: Four times a day (QID) | INTRAMUSCULAR | Status: DC | PRN

## 2014-08-21 MED ORDER — DIPHENHYDRAMINE HCL 25 MG PO CAPS: 25.0000 mg | ORAL_CAPSULE | Freq: Four times a day (QID) | ORAL | Status: DC | PRN

## 2014-08-21 MED ORDER — IOHEXOL 300 MG/ML  SOLN
25.0000 mL | INTRAMUSCULAR | Status: AC
Start: 2014-08-21 — End: 2014-08-21
  Administered 2014-08-21: 25 mL via ORAL

## 2014-08-21 MED ORDER — MORPHINE SULFATE 2 MG/ML IJ SOLN
1.0000 mg | INTRAMUSCULAR | Status: DC | PRN
  Administered 2014-08-21: 2 mg via INTRAVENOUS
  Filled 2014-08-21: qty 1

## 2014-08-21 MED ORDER — ONDANSETRON 4 MG PO TBDP
4.0000 mg | ORAL_TABLET | Freq: Four times a day (QID) | ORAL | Status: DC | PRN
  Filled 2014-08-21: qty 1

## 2014-08-21 MED ORDER — PIPERACILLIN-TAZOBACTAM 3.375 G IVPB
3.3750 g | Freq: Three times a day (TID) | INTRAVENOUS | Status: DC
  Filled 2014-08-21 (×3): qty 50

## 2014-08-21 MED ORDER — ACETAMINOPHEN 325 MG PO TABS: 650.0000 mg | ORAL_TABLET | Freq: Four times a day (QID) | ORAL | Status: DC | PRN

## 2014-08-21 MED ORDER — IOHEXOL 300 MG/ML  SOLN
100.0000 mL | Freq: Once | INTRAMUSCULAR | Status: AC | PRN
  Administered 2014-08-21: 100 mL via INTRAVENOUS

## 2014-08-21 MED ORDER — POTASSIUM CHLORIDE IN NACL 20-0.9 MEQ/L-% IV SOLN
INTRAVENOUS | Status: DC
  Administered 2014-08-21 – 2014-08-24 (×8): via INTRAVENOUS
  Administered 2014-08-25: 125 mL/h via INTRAVENOUS
  Administered 2014-08-25 – 2014-08-30 (×10): via INTRAVENOUS
  Filled 2014-08-21 (×25): qty 1000

## 2014-08-21 MED ORDER — HYDROMORPHONE HCL 1 MG/ML IJ SOLN
1.0000 mg | Freq: Once | INTRAMUSCULAR | Status: AC
  Administered 2014-08-21: 1 mg via INTRAVENOUS
  Filled 2014-08-21: qty 1

## 2014-08-21 MED ORDER — HYDROMORPHONE HCL 1 MG/ML IJ SOLN
1.0000 mg | INTRAMUSCULAR | Status: DC | PRN
  Administered 2014-08-21: 2 mg via INTRAVENOUS
  Administered 2014-08-21: 1 mg via INTRAVENOUS
  Administered 2014-08-22 – 2014-08-26 (×35): 2 mg via INTRAVENOUS
  Administered 2014-08-26: 1 mg via INTRAVENOUS
  Administered 2014-08-26: 2 mg via INTRAVENOUS
  Administered 2014-08-26 – 2014-08-27 (×4): 1 mg via INTRAVENOUS
  Administered 2014-08-28: 2 mg via INTRAVENOUS
  Administered 2014-08-28 – 2014-08-30 (×5): 1 mg via INTRAVENOUS
  Administered 2014-08-30: 2 mg via INTRAVENOUS
  Filled 2014-08-21: qty 2
  Filled 2014-08-21: qty 1
  Filled 2014-08-21: qty 2
  Filled 2014-08-21: qty 1
  Filled 2014-08-21 (×5): qty 2
  Filled 2014-08-21: qty 1
  Filled 2014-08-21 (×3): qty 2
  Filled 2014-08-21: qty 1
  Filled 2014-08-21 (×14): qty 2
  Filled 2014-08-21: qty 1
  Filled 2014-08-21 (×2): qty 2
  Filled 2014-08-21: qty 1
  Filled 2014-08-21 (×2): qty 2
  Filled 2014-08-21: qty 1
  Filled 2014-08-21 (×2): qty 2
  Filled 2014-08-21: qty 1
  Filled 2014-08-21 (×10): qty 2
  Filled 2014-08-21 (×3): qty 1

## 2014-08-21 MED ORDER — DIPHENHYDRAMINE HCL 50 MG/ML IJ SOLN: 25.0000 mg | Freq: Four times a day (QID) | INTRAMUSCULAR | Status: DC | PRN

## 2014-08-21 MED ORDER — PIPERACILLIN-TAZOBACTAM 3.375 G IVPB
3.3750 g | Freq: Three times a day (TID) | INTRAVENOUS | Status: DC
  Administered 2014-08-21 – 2014-08-31 (×29): 3.375 g via INTRAVENOUS
  Filled 2014-08-21 (×31): qty 50

## 2014-08-21 MED ORDER — ACETAMINOPHEN 650 MG RE SUPP: 650.0000 mg | Freq: Four times a day (QID) | RECTAL | Status: DC | PRN

## 2014-08-21 MED ORDER — MORPHINE SULFATE 2 MG/ML IJ SOLN
2.0000 mg | Freq: Once | INTRAMUSCULAR | Status: AC
  Administered 2014-08-21: 2 mg via INTRAVENOUS
  Filled 2014-08-21: qty 1

## 2014-08-21 MED ORDER — ONDANSETRON HCL 4 MG/2ML IJ SOLN
4.0000 mg | Freq: Once | INTRAMUSCULAR | Status: AC
  Administered 2014-08-21: 4 mg via INTRAVENOUS
  Filled 2014-08-21: qty 2

## 2014-08-21 NOTE — H&P (Signed)
[facility] Surgery Admission Note  Branch Pacitti [facility] [birth year]  [id number].    Requesting MD: Dr. Tyrone Nine Chief Complaint/Reason for Consult: Worsening diverticulitis  HPI:  44 y/o obese white male presented to [facility] with recurrent LLQ abdominal pain.  He was recently admitted and placed on IV Rocephin and Flagyl for diverticulitis with microperforation and for a UTI. He was treated with medical management and did not require surgical interventions.  His diet was slowly advanced and discharged home on 08/03/14 with 1 week of antibiotics.  He was doing well at home for 2 weeks.  Wednesday he saw his PCP and was still feeling okay, was found to have a benign exam and blood in urine.  He says since Wednesday afternoon the pain has significantly worsened and he has had chills, severe LLQ abdominal pain 9/10.  No N/V, no fevers, no CP/SOB.  Limited flatus and no BM since Wednesday.  Noticed tea colored urine.  No aggravating/alleviating factors.  No radiating pain.  H/o appendectomy, no other surgeries or hernias.    ROS: All systems reviewed and otherwise negative except for as above  Family History  Problem Relation Age of Onset  . Cancer Father     lung    Past Medical History  Diagnosis Date  . Pneumonia 1990  . Diverticulitis of colon with perforation 07/30/2014    acute diverticulititis with microperforation  . Appendicitis 1981  . Thigh hematoma 2013    Past Surgical History  Procedure Laterality Date  . Appendectomy  1981    Social History:  reports that he has never smoked. He has never used smokeless tobacco. He reports that he drinks alcohol. He reports that he does not use illicit drugs.  Allergies: No Known Allergies   (Not in a hospital admission)  Blood pressure 134/84, pulse 81, temperature 98.8 F (37.1 C), temperature source Oral, resp. rate 17, height _0  (1.778 m), weight 122.471 kg (270 lb), SpO2 99 %. Physical Exam: General: pleasant, obese,  WD/WN white male who is laying in bed in NAD HEENT: head is normocephalic, atraumatic.  Sclera are noninjected.  PERRL.  Ears and nose without any masses or lesions.  Mouth is pink and moist Heart: regular, rate, and rhythm.  No obvious murmurs, gallops, or rubs noted.  Palpable pedal pulses bilaterally Lungs: CTAB, no wheezes, rhonchi, or rales noted.  Respiratory effort nonlabored. Abd: obese, soft, distended, tender to palpation over LLQ/suprapubic region, +BS, no masses, hernias, or organomegaly, appendectomy scar in RLQ MS: all 4 extremities are symmetrical with no cyanosis, clubbing, or edema. Skin: warm and dry with no masses, lesions, or rashes Psych: A&Ox3 with an appropriate affect.   Results for orders placed or performed during the hospital encounter of 08/21/14 (from the past 48 hour(s))  Lipase, blood     Status: None   Collection Time: 08/21/14 10:08 AM  Result Value Ref Range   Lipase 27 22 - 51 U/L  Comprehensive metabolic panel     Status: Abnormal   Collection Time: 08/21/14 10:08 AM  Result Value Ref Range   Sodium 139 135 - 145 mmol/L   Potassium 3.9 3.5 - 5.1 mmol/L   Chloride 104 101 - 111 mmol/L   CO2 26 22 - 32 mmol/L   Glucose, Bld 118 (H) 65 - 99 mg/dL   BUN 12 6 - 20 mg/dL   Creatinine, Ser 1.57 (H) 0.61 - 1.24 mg/dL   Calcium 9.1 8.9 - 10.3 mg/dL   Total Protein 8.3 (  H) 6.5 - 8.1 g/dL   Albumin 3.6 3.5 - 5.0 g/dL   AST 18 15 - 41 U/L   ALT 24 17 - 63 U/L   Alkaline Phosphatase 56 38 - 126 U/L   Total Bilirubin 1.0 0.3 - 1.2 mg/dL   GFR calc non Af Amer 51 (L) >60 mL/min   GFR calc Af Amer 59 (L) >60 mL/min    Comment: (NOTE) The eGFR has been calculated using the CKD EPI equation. This calculation has not been validated in all clinical situations. eGFR's persistently <60 mL/min signify possible Chronic Kidney Disease.    Anion gap 9 5 - 15  CBC     Status: Abnormal   Collection Time: 08/21/14 10:08 AM  Result Value Ref Range   WBC 12.0 (H) 4.0  - 10.5 K/uL   RBC 5.07 4.22 - 5.81 MIL/uL   Hemoglobin 14.1 13.0 - 17.0 g/dL   HCT 41.7 39.0 - 52.0 %   MCV 82.2 78.0 - 100.0 fL   MCH 27.8 26.0 - 34.0 pg   MCHC 33.8 30.0 - 36.0 g/dL   RDW 14.0 11.5 - 15.5 %   Platelets 206 150 - 400 K/uL  Urinalysis, Routine w reflex microscopic (not at [facility])     Status: Abnormal   Collection Time: 08/21/14 10:20 AM  Result Value Ref Range   Color, Urine RED (A) YELLOW    Comment: BIOCHEMICALS MAY BE AFFECTED BY COLOR   APPearance TURBID (A) CLEAR   Specific Gravity, Urine 1.018 1.005 - 1.030   pH 5.5 5.0 - 8.0   Glucose, UA NEGATIVE NEGATIVE mg/dL   Hgb urine dipstick LARGE (A) NEGATIVE   Bilirubin Urine NEGATIVE NEGATIVE   Ketones, ur 15 (A) NEGATIVE mg/dL   Protein, ur 100 (A) NEGATIVE mg/dL   Urobilinogen, UA 0.2 0.0 - 1.0 mg/dL   Nitrite NEGATIVE NEGATIVE   Leukocytes, UA SMALL (A) NEGATIVE  Urine microscopic-add on     Status: Abnormal   Collection Time: 08/21/14 10:20 AM  Result Value Ref Range   WBC, UA 3-6 <3 WBC/hpf   RBC / HPF 21-50 <3 RBC/hpf   Bacteria, UA FEW (A) RARE   Casts GRANULAR CAST (A) NEGATIVE   Urine-Other AMORPHOUS URATES/PHOSPHATES    Ct Abdomen Pelvis W Contrast  08/21/2014   CLINICAL DATA:  Two days of progressive abdominal pain  EXAM: CT ABDOMEN AND PELVIS WITH CONTRAST  TECHNIQUE: Multidetector CT imaging of the abdomen and pelvis was performed using the standard protocol following bolus administration of intravenous contrast. Oral contrast was also administered.  CONTRAST:  179m OMNIPAQUE IOHEXOL 300 MG/ML  SOLN  COMPARISON:  July 30, 2014  FINDINGS: Lung bases are clear.  There appears to be mild hepatic steatosis. No focal liver lesions are identified. Gallbladder wall is not thickened. There is no biliary duct dilatation.  Spleen, pancreas, and adrenals appear normal.  There is a nonobstructing 5 mm calculus in the mid right kidney. No other intrarenal calculi are identified. There is no demonstrable renal mass  or hydronephrosis on either side. There are no ureteral calculi on either side.  In the pelvis, urinary bladder is midline with normal wall thickness. There is no pelvic mass. There is fat in each inguinal ring.  There is again noted wall thickening in the sigmoid colon with surrounding mesenteric inflammation consistent with diverticulitis. There is extraluminal air in the proximal sigmoid colon region with surrounding inflammation, consistent with a degree of perforation. There is slightly more extraluminal  air in this area compared to recent prior study, although this air is confined to the left pelvic region. There is slightly more fatty inflammation in this area compared to prior study. There is loculated fluid in this area, although there is no well-defined abscess in this region currently.  There is no bowel obstruction. No free air or portal venous air. Appendix is absent.  There is no ascites, adenopathy, or abscess in the abdomen or pelvis. There is atherosclerotic change in the distal aorta but no aneurysm. There is degenerative change in the lumbar spine.  There is a benign appearing lesion in the superior left acetabulum measuring 3.2 x 1.7 cm. A similar appearing lesion is also present in the intertrochanteric left femur region measuring 2.1 x 1.6 cm. No destructive bone lesions are appreciable.  IMPRESSION: Diverticulitis in the proximal sigmoid colon with evidence of perforation again noted. There is slightly more extraluminal air in this area compared to the previous study with slightly more involvement of surrounding fat. A well-defined abscess is not seen. Well-defined abscess is not currently appreciable.  No other inflammatory foci are identified elsewhere. Note that inflammation extends into the left inguinal ring with fatty stranding, stable from previous study.  Elsewhere, there is no bowel obstruction. No abscess. Appendix absent.  There is a 5 mm calculus in the right kidney,  nonobstructing. No ureteral calculi. No hydronephrosis. Inflammation extends to the level of the superior urinary bladder; the urinary bladder wall does not appear appreciably thickened, however.  Critical Value/emergent results were called by telephone at the time of interpretation on 08/21/2014 at 1:17 pm to [facility] , who verbally acknowledged these results.   Electronically Signed   By: Lowella Grip III M.D.   On: 08/21/2014 13:18      Assessment/Plan Recurrent diverticulitis with perforation and free air Leukocytosis - 12.0 AKI - Cr. 1.57   Plan: 1.  Re-admit for unresolved/recurrent diverticulitis with slightly large free air than previous scan.  Admit to [facility] 2.  Medical management with IV antibiotics (Zosyn since failed rocephin/flagyl), NPO, bowel rest, antiemetics  3.  SCD's and hold off on lovenox for now in case he needs to go to OR 4.  Ambulate and IS 5.  IV hydration should hopefully clear up elevated creatinine.  Recheck tomorrow. 6.  Very likely that he will need an operation this admission which will have to consist of hartmans with colostomy.  He is aware of this as it was discussed with him last time.   Nat Christen, [facility] Surgery 08/21/2014, 2:04 PM Pager: [phone]

## 2014-08-21 NOTE — ED Provider Notes (Signed)
CSN: [id number]     Arrival date & time 08/21/14  7829 History   First MD Initiated Contact with Patient 08/21/14 [phone]     Chief Complaint  Patient presents with  . Abdominal Pain    HPI   44 year old male presents today with left lower quadrant abdominal pain. Patient was seen in the ED for left lower quadrant pain, fever, chills, nausea and 07/30/2014. He was diagnosed with a diverticulitis with a perforation with a small amount of free air. He was admitted to the hospital given IV antibiotics, monitored, discharged home with oral antibiotics for 7 days. Patient reports he's been taking the antibiotics as directed, finish the total course approximately 7 days ago. Patient notes he had a follow-up appointment with his primary care provider on Wednesday, notes that after the appointment and palpation the abdomen he started to develop left lower quadrant pain. He reports the pain has worsened, to the point where walking is severely painful. Patient denies radiation of pain, denies nausea, vomiting. He reports his last normal bowel movement was on Wednesday, has been eating and drinking. He reports by mouth intake does not cause abdominal pain. Notes that he has been taking oral pain medication but only as needed, reports taking one on Wednesday and Thursday as needed for the pain. Patient reports he had an appendectomy when he was young, no recent abdominal surgeries otherwise. 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 He denies any specific injury, or heavy straining.  Past Medical History  Diagnosis Date  . Pneumonia 1990  . Diverticulitis of colon with perforation 07/30/2014    acute diverticulititis with microperforation  . Appendicitis 1981  . Thigh hematoma 2013   Past Surgical History  Procedure Laterality Date  . Appendectomy  1981   Family History  Problem Relation Age of Onset  . Cancer Father     lung   History  Substance Use  Topics  . Smoking status: Never Smoker   . Smokeless tobacco: Never Used  . Alcohol Use: Yes     Comment: 07/30/2014 "might have a beer/month, if that"    Review of Systems  All other systems reviewed and are negative.   Allergies  Review of patient's allergies indicates no known allergies.  Home Medications   Prior to Admission medications   Medication Sig Start Date End Date Taking? Authorizing Provider  ibuprofen (ADVIL,MOTRIN) 200 MG tablet Take 200 mg by mouth every 6 (six) hours as needed for pain.   Yes Historical Provider, MD  amoxicillin-clavulanate (AUGMENTIN) 875-125 MG per tablet Take 1 tablet by mouth every 12 (twelve) hours. Patient not taking: Reported on 08/21/2014 08/04/14   Barnetta Chapel, PA-C  oxyCODONE-acetaminophen (PERCOCET/ROXICET) 5-325 MG per tablet Take 1-2 tablets by mouth every 4 (four) hours as needed for moderate pain. Patient not taking: Reported on 08/21/2014 08/04/14   Barnetta Chapel, PA-C   BP 122/83 mmHg  Pulse 80  Temp(Src) 98.8 F (37.1 C) (Oral)  Resp 17  Ht  (1.778 m)  Wt 270 lb (122.471 kg)  BMI 38.74 kg/m2  SpO2 98%   Physical Exam  Constitutional: He is oriented to person, place, and time. He appears well-developed and well-nourished.  HENT:  Head: Normocephalic and atraumatic.  Eyes: Conjunctivae are normal. Pupils are equal, round, and reactive to light. Right eye exhibits no discharge. Left eye exhibits no discharge. No scleral icterus.  Neck: Normal range of motion. No JVD  present. No tracheal deviation present.  Pulmonary/Chest: Effort normal. No stridor.  Neurological: He is alert and oriented to person, place, and time. Coordination normal.  Psychiatric: He has a normal mood and affect. His behavior is normal. Judgment and thought content normal.  Nursing note and vitals reviewed.   ED Course  Procedures (including critical care time) Labs Review Labs Reviewed  COMPREHENSIVE METABOLIC PANEL - Abnormal; Notable for the  following:    Glucose, Bld 118 (*)    Creatinine, Ser 1.57 (*)    Total Protein 8.3 (*)    GFR calc non Af Amer 51 (*)    GFR calc Af Amer 59 (*)    All other components within normal limits  CBC - Abnormal; Notable for the following:    WBC 12.0 (*)    All other components within normal limits  URINALYSIS, ROUTINE W REFLEX MICROSCOPIC (NOT AT [facility]) - Abnormal; Notable for the following:    Color, Urine RED (*)    APPearance TURBID (*)    Hgb urine dipstick LARGE (*)    Ketones, ur 15 (*)    Protein, ur 100 (*)    Leukocytes, UA SMALL (*)    All other components within normal limits  URINE MICROSCOPIC-ADD ON - Abnormal; Notable for the following:    Bacteria, UA FEW (*)    Casts GRANULAR CAST (*)    All other components within normal limits  LIPASE, BLOOD    Imaging Review Ct Abdomen Pelvis W Contrast  08/21/2014   CLINICAL DATA:  Two days of progressive abdominal pain  EXAM: CT ABDOMEN AND PELVIS WITH CONTRAST  TECHNIQUE: Multidetector CT imaging of the abdomen and pelvis was performed using the standard protocol following bolus administration of intravenous contrast. Oral contrast was also administered.  CONTRAST:  OMNIPAQUE IOHEXOL 300 MG/ML  SOLN  COMPARISON:  July 30, 2014  FINDINGS: Lung bases are clear.  There appears to be mild hepatic steatosis. No focal liver lesions are identified. Gallbladder wall is not thickened. There is no biliary duct dilatation.  Spleen, pancreas, and adrenals appear normal.  There is a nonobstructing 5 mm calculus in the mid right kidney. No other intrarenal calculi are identified. There is no demonstrable renal mass or hydronephrosis on either side. There are no ureteral calculi on either side.  In the pelvis, urinary bladder is midline with normal wall thickness. There is no pelvic mass. There is fat in each inguinal ring.  There is again noted wall thickening in the sigmoid colon with surrounding mesenteric inflammation consistent with  diverticulitis. There is extraluminal air in the proximal sigmoid colon region with surrounding inflammation, consistent with a degree of perforation. There is slightly more extraluminal air in this area compared to recent prior study, although this air is confined to the left pelvic region. There is slightly more fatty inflammation in this area compared to prior study. There is loculated fluid in this area, although there is no well-defined abscess in this region currently.  There is no bowel obstruction. No free air or portal venous air. Appendix is absent.  There is no ascites, adenopathy, or abscess in the abdomen or pelvis. There is atherosclerotic change in the distal aorta but no aneurysm. There is degenerative change in the lumbar spine.  There is a benign appearing lesion in the superior left acetabulum measuring 3.2 x 1.7 cm. A similar appearing lesion is also present in the intertrochanteric left femur region measuring 2.1 x 1.6 cm. No destructive bone  lesions are appreciable.  IMPRESSION: Diverticulitis in the proximal sigmoid colon with evidence of perforation again noted. There is slightly more extraluminal air in this area compared to the previous study with slightly more involvement of surrounding fat. A well-defined abscess is not seen. Well-defined abscess is not currently appreciable.  No other inflammatory foci are identified elsewhere. Note that inflammation extends into the left inguinal ring with fatty stranding, stable from previous study.  Elsewhere, there is no bowel obstruction. No abscess. Appendix absent.  There is a 5 mm calculus in the right kidney, nonobstructing. No ureteral calculi. No hydronephrosis. Inflammation extends to the level of the superior urinary bladder; the urinary bladder wall does not appear appreciably thickened, however.  Critical Value/emergent results were called by telephone at the time of interpretation on 08/21/2014 at 1:17 pm to [facility] , who  verbally acknowledged these results.   Electronically Signed   By: Bretta Bang III M.D.   On: 08/21/2014 13:18     EKG Interpretation None      MDM   Final diagnoses:  Left lower quadrant pain    Labs: Urinalysis, lipase, CMP, CBC- small leukocytes, large hemoglobin  Imaging: CT abdomen- diverticulitis proximal sigmoid colon with evidence of perforation, no appreciable abscess noted  Consults: General surgery  Therapeutics: Zofran, Dilaudid, morphine  Discharge Meds:   Assessment/Plan: Patient presents with worsening abdominal pain with CT findings of bowel perforation.  Gen. surgery was consult at would be taking over care of patient. Patient remained stable here in the ED, and responds well to pain medication.         Eyvonne Mechanic, PA-C 08/21/14 1645  Melene Plan, DO 08/22/14 [phone]

## 2014-08-21 NOTE — ED Notes (Signed)
Pt from home with worsening abdominal pain starting this past Wed after being seen by PCP.  Pt had recent abdominal surgery.  Denies N/V/D or chills, reports pain with movement and palpation. NAD, A&O.

## 2014-08-22 DIAGNOSIS — N179 Acute kidney failure, unspecified: Secondary | ICD-10-CM | POA: Diagnosis present

## 2014-08-22 DIAGNOSIS — E669 Obesity, unspecified: Secondary | ICD-10-CM | POA: Diagnosis present

## 2014-08-22 DIAGNOSIS — Z6838 Body mass index (BMI) 38.0-38.9, adult: Secondary | ICD-10-CM | POA: Diagnosis not present

## 2014-08-22 DIAGNOSIS — R1032 Left lower quadrant pain: Secondary | ICD-10-CM | POA: Diagnosis present

## 2014-08-22 DIAGNOSIS — K5720 Diverticulitis of large intestine with perforation and abscess without bleeding: Secondary | ICD-10-CM | POA: Diagnosis present

## 2014-08-22 DIAGNOSIS — K5669 Other intestinal obstruction: Secondary | ICD-10-CM | POA: Diagnosis present

## 2014-08-22 LAB — CBC
HCT: 37.7 % — ABNORMAL LOW (ref 39.0–52.0)
Hemoglobin: 12.5 g/dL — ABNORMAL LOW (ref 13.0–17.0)
MCH: 28.2 pg (ref 26.0–34.0)
MCHC: 33.2 g/dL (ref 30.0–36.0)
MCV: 84.9 fL (ref 78.0–100.0)
Platelets: 207 10*3/uL (ref 150–400)
RBC: 4.44 MIL/uL (ref 4.22–5.81)
RDW: 14.2 % (ref 11.5–15.5)
WBC: 11.3 10*3/uL — AB (ref 4.0–10.5)

## 2014-08-22 LAB — BASIC METABOLIC PANEL
ANION GAP: 10 (ref 5–15)
BUN: 12 mg/dL (ref 6–20)
CO2: 25 mmol/L (ref 22–32)
Calcium: 8.3 mg/dL — ABNORMAL LOW (ref 8.9–10.3)
Chloride: 101 mmol/L (ref 101–111)
Creatinine, Ser: 1.65 mg/dL — ABNORMAL HIGH (ref 0.61–1.24)
GFR calc Af Amer: 56 mL/min — ABNORMAL LOW (ref >60)
GFR, EST NON AFRICAN AMERICAN: 48 mL/min — AB (ref >60)
Glucose, Bld: 84 mg/dL (ref 65–99)
Potassium: 4.5 mmol/L (ref 3.5–5.1)
Sodium: 136 mmol/L (ref 135–145)

## 2014-08-22 MED ORDER — ENOXAPARIN SODIUM 30 MG/0.3ML ~~LOC~~ SOLN
20.0000 mg | SUBCUTANEOUS | Status: AC
  Administered 2014-08-22: 20 mg via SUBCUTANEOUS
  Filled 2014-08-22: qty 0.2

## 2014-08-22 MED ORDER — ENOXAPARIN SODIUM 60 MG/0.6ML ~~LOC~~ SOLN
60.0000 mg | SUBCUTANEOUS | Status: DC
  Administered 2014-08-23 – 2014-08-30 (×6): 60 mg via SUBCUTANEOUS
  Filled 2014-08-22 (×6): qty 0.6

## 2014-08-22 MED ORDER — ENOXAPARIN SODIUM 40 MG/0.4ML ~~LOC~~ SOLN
40.0000 mg | SUBCUTANEOUS | Status: DC
  Administered 2014-08-22: 40 mg via SUBCUTANEOUS

## 2014-08-22 NOTE — Progress Notes (Signed)
[facility] Progress Note     Subjective: Pt doing okay, still has abdominal soreness in LLQ.  No N/V, tolerating ice chips.  Ambulating well.  No other complaints.  h having flatus, no BM yet.  Objective: Vital signs in last 24 hours: Temp:  [97.6 F (36.4 C)-98.8 F (37.1 C)] 97.6 F (36.4 C) (08/06 0544) Pulse Rate:  [72-85] 72 (08/06 0544) Resp:  [16-18] 16 (08/06 0544) BP: (112-134)/(69-84) 112/69 mmHg (08/06 0544) SpO2:  [96 %-99 %] 97 % (08/06 0544) Last BM Date: 08/19/14  Intake/Output from previous day: 08/05 0701 - 08/06 0700 In: 1646.7 [P.O.:120; I.V.:1426.7; IV Piggyback:100] Out: 445 [Urine:445] Intake/Output this shift:    PE: Gen:  Alert, NAD, pleasant Card:  RRR, no M/G/R heard Pulm:  CTA, no W/R/R Abd: Obese, soft, distended, tender to palpation in LLQ, +BS, no HSM   Lab Results:   Recent Labs  08/21/14 1008 08/22/14 0326  WBC 12.0* 11.3*  HGB 14.1 12.5*  HCT 41.7 37.7*  PLT 206 207   BMET  Recent Labs  08/21/14 1008 08/22/14 0326  NA 139 136  K 3.9 4.5  CL 104 101  CO2 26 25  GLUCOSE 118* 84  BUN 12 12  CREATININE 1.57* 1.65*  CALCIUM 9.1 8.3*   PT/INR No results for input(s): LABPROT, INR in the last 72 hours. CMP     Component Value Date/Time   NA 136 08/22/2014 0326   K 4.5 08/22/2014 0326   CL 101 08/22/2014 0326   CO2 25 08/22/2014 0326   GLUCOSE 84 08/22/2014 0326   BUN 12 08/22/2014 0326   CREATININE 1.65* 08/22/2014 0326   CALCIUM 8.3* 08/22/2014 0326   PROT 8.3* 08/21/2014 1008   ALBUMIN 3.6 08/21/2014 1008   AST 18 08/21/2014 1008   ALT 24 08/21/2014 1008   ALKPHOS 56 08/21/2014 1008   BILITOT 1.0 08/21/2014 1008   GFRNONAA 48* 08/22/2014 0326   GFRAA 56* 08/22/2014 0326   Lipase     Component Value Date/Time   LIPASE 27 08/21/2014 1008       Studies/Results: Ct Abdomen Pelvis W Contrast  08/21/2014   CLINICAL DATA:  Two days of progressive abdominal pain  EXAM: CT ABDOMEN AND PELVIS  WITH CONTRAST  TECHNIQUE: Multidetector CT imaging of the abdomen and pelvis was performed using the standard protocol following bolus administration of intravenous contrast. Oral contrast was also administered.  CONTRAST:  OMNIPAQUE IOHEXOL 300 MG/ML  SOLN  COMPARISON:  July 30, 2014  FINDINGS: Lung bases are clear.  There appears to be mild hepatic steatosis. No focal liver lesions are identified. Gallbladder wall is not thickened. There is no biliary duct dilatation.  Spleen, pancreas, and adrenals appear normal.  There is a nonobstructing 5 mm calculus in the mid right kidney. No other intrarenal calculi are identified. There is no demonstrable renal mass or hydronephrosis on either side. There are no ureteral calculi on either side.  In the pelvis, urinary bladder is midline with normal wall thickness. There is no pelvic mass. There is fat in each inguinal ring.  There is again noted wall thickening in the sigmoid colon with surrounding mesenteric inflammation consistent with diverticulitis. There is extraluminal air in the proximal sigmoid colon region with surrounding inflammation, consistent with a degree of perforation. There is slightly more extraluminal air in this area compared to recent prior study, although this air is confined to the left pelvic region. There is slightly more fatty inflammation in this  area compared to prior study. There is loculated fluid in this area, although there is no well-defined abscess in this region currently.  There is no bowel obstruction. No free air or portal venous air. Appendix is absent.  There is no ascites, adenopathy, or abscess in the abdomen or pelvis. There is atherosclerotic change in the distal aorta but no aneurysm. There is degenerative change in the lumbar spine.  There is a benign appearing lesion in the superior left acetabulum measuring 3.2 x 1.7 cm. A similar appearing lesion is also present in the intertrochanteric left femur region measuring 2.1  x 1.6 cm. No destructive bone lesions are appreciable.  IMPRESSION: Diverticulitis in the proximal sigmoid colon with evidence of perforation again noted. There is slightly more extraluminal air in this area compared to the previous study with slightly more involvement of surrounding fat. A well-defined abscess is not seen. Well-defined abscess is not currently appreciable.  No other inflammatory foci are identified elsewhere. Note that inflammation extends into the left inguinal ring with fatty stranding, stable from previous study.  Elsewhere, there is no bowel obstruction. No abscess. Appendix absent.  There is a 5 mm calculus in the right kidney, nonobstructing. No ureteral calculi. No hydronephrosis. Inflammation extends to the level of the superior urinary bladder; the urinary bladder wall does not appear appreciably thickened, however.  Critical Value/emergent results were called by telephone at the time of interpretation on 08/21/2014 at 1:17 pm to [facility] , who verbally acknowledged these results.   Electronically Signed   By: Bretta Bang III M.D.   On: 08/21/2014 13:18    Anti-infectives: Anti-infectives    Start     Dose/Rate Route Frequency Ordered Stop   08/21/14 1630  piperacillin-tazobactam (ZOSYN) IVPB 3.375 g     3.375 g 12.5 mL/hr over 240 Minutes Intravenous Every 8 hours 08/21/14 1553     08/21/14 1445  piperacillin-tazobactam (ZOSYN) IVPB 3.375 g  Status:  Discontinued     3.375 g 12.5 mL/hr over 240 Minutes Intravenous 3 times per day 08/21/14 1432 08/21/14 1552       Assessment/Plan Recurrent diverticulitis with perforation and free air Leukocytosis - 11.3 AKI - Cr. 1.65   Plan: 1. Re-admit for unresolved/recurrent diverticulitis with slightly large free air than previous scan.  2. Medical management with IV antibiotics (Zosyn since failed rocephin/flagyl), NPO, bowel rest, antiemetics  3. SCD's and lovenox 4. Ambulate and IS 5. IV hydration  should hopefully clear up elevated creatinine. Recheck tomorrow. 6. Some clinical improvement today. Continue NPO with only ice chips.   7.  Very likely that he will need an operation this admission which will have to consist of hartmans with colostomy. He is aware of this as it was discussed with him last time.       Nonie Hoyer 08/22/2014, 10:26 AM Pager: [phone]

## 2014-08-22 NOTE — Progress Notes (Signed)
ANTICOAGULATION CONSULT NOTE - Initial Consult  Pharmacy Consult for Lovenox Indication: VTE prophylaxis  No Known Allergies  Patient Measurements: Height:  (177.8 cm) Weight: 270 lb (122.471 kg) IBW/kg (Calculated) : 73   Vital Signs: Temp: 97.6 F (36.4 C) (08/06 0544) Temp Source: Oral (08/06 0544) BP: 112/69 mmHg (08/06 0544) Pulse Rate: 72 (08/06 0544)  Labs:  Recent Labs  08/21/14 1008 08/22/14 0326  HGB 14.1 12.5*  HCT 41.7 37.7*  PLT 206 207  CREATININE 1.57* 1.65*   Estimated CrCl ~ 57 ml/min (ABW) Estimated Creatinine Clearance: 73.4 mL/min (by C-G formula based on Cr of 1.65).   Medical History: Past Medical History  Diagnosis Date  . Pneumonia 1990  . Diverticulitis of colon with perforation 07/30/2014    acute diverticulititis with microperforation  . Appendicitis 1981  . Thigh hematoma 2013    Medications:  Prescriptions prior to admission  Medication Sig Dispense Refill Last Dose  . ibuprofen (ADVIL,MOTRIN) 200 MG tablet Take 200 mg by mouth every 6 (six) hours as needed for pain.   Past Month at Unknown time  . amoxicillin-clavulanate (AUGMENTIN) 875-125 MG per tablet Take 1 tablet by mouth every 12 (twelve) hours. (Patient not taking: Reported on 08/21/2014) 12 tablet 0 Completed Course at Unknown time  . oxyCODONE-acetaminophen (PERCOCET/ROXICET) 5-325 MG per tablet Take 1-2 tablets by mouth every 4 (four) hours as needed for moderate pain. (Patient not taking: Reported on 08/21/2014) 30 tablet 0 Completed Course at Unknown time   Scheduled:  . enoxaparin (LOVENOX) injection  20 mg Subcutaneous NOW  . [START ON 08/23/2014] enoxaparin (LOVENOX) injection  60 mg Subcutaneous Q24H  . piperacillin-tazobactam (ZOSYN)  IV  3.375 g Intravenous Q8H    Assessment: 44 y.o obese male admitted 08/21/14 with recurrent LLQ abdominal pain. He was recently admitted and placed on IV Rocephin and Flagyl for diverticulitis with microperforation and for a UTI.;   treated with medical management and did not require surgical interventions.  Re-admitted 08/21/14 for unresolved/recurrent diverticulitis.  Weight =270 lbs 122.5 kg , BMI = 38.8  SCR. 1.65, estimated CrCl ~ 57 ml/min H/H 12.5/37.7 PLTC 207K. No bleeding noted.  Pharmacy may adjust lovenox.  Will adjust dose to 0.5 mg/kg SQ = 60 mg SQ q24h   Goal of Therapy:  4hour anti Xa level = 0.3-0.6 units/ml Monitor platelets by anticoagulation protocol: Yes   Plan:  RN has already given  lovenox SQ x1 this AM ~ 11am.  Give Lovenox  SQ x1 (in addition to the  dose just given this AM) to = 60 mg today. Then lovenox  SQ q24h (0.5mg /kg q24h) for obese pt with BMI >30  Noah Delaine, RPh Clinical Pharmacist Pager: [phone] 08/22/2014,12:03 PM

## 2014-08-23 LAB — BASIC METABOLIC PANEL
ANION GAP: 9 (ref 5–15)
BUN: 13 mg/dL (ref 6–20)
CHLORIDE: 103 mmol/L (ref 101–111)
CO2: 26 mmol/L (ref 22–32)
Calcium: 8.4 mg/dL — ABNORMAL LOW (ref 8.9–10.3)
Creatinine, Ser: 1.41 mg/dL — ABNORMAL HIGH (ref 0.61–1.24)
GFR calc non Af Amer: 58 mL/min — ABNORMAL LOW (ref >60)
GLUCOSE: 77 mg/dL (ref 65–99)
Potassium: 4 mmol/L (ref 3.5–5.1)
SODIUM: 138 mmol/L (ref 135–145)

## 2014-08-23 NOTE — Progress Notes (Signed)
[facility] Progress Note     Subjective: Pt doing well, no N/V, ambulating well.  Pain improved.  Using less pain meds.  Thirsty/hungry.  +flatus, no BM yet.  Objective: Vital signs in last 24 hours: Temp:  [97.5 F (36.4 C)-98.3 F (36.8 C)] 98 F (36.7 C) (08/07 0514) Pulse Rate:  [72-79] 72 (08/07 0514) Resp:  [18] 18 (08/07 0514) BP: (110-132)/(66-83) 132/78 mmHg (08/07 0514) SpO2:  [95 %-97 %] 95 % (08/07 0514) Last BM Date: 08/19/14  Intake/Output from previous day: 08/06 0701 - 08/07 0700 In: 3036.7 [I.V.:2886.7; IV Piggyback:150] Out: 2550 [Urine:2550] Intake/Output this shift:    PE: Gen:  Alert, NAD, pleasant Abd: Soft, ND, less tender, but still present in LLQ, +BS, no HSM   Lab Results:   Recent Labs  08/21/14 1008 08/22/14 0326  WBC 12.0* 11.3*  HGB 14.1 12.5*  HCT 41.7 37.7*  PLT 206 207   BMET  Recent Labs  08/22/14 0326 08/23/14 0526  NA 136 138  K 4.5 4.0  CL 101 103  CO2 25 26  GLUCOSE 84 77  BUN 12 13  CREATININE 1.65* 1.41*  CALCIUM 8.3* 8.4*   PT/INR No results for input(s): LABPROT, INR in the last 72 hours. CMP     Component Value Date/Time   NA 138 08/23/2014 0526   K 4.0 08/23/2014 0526   CL 103 08/23/2014 0526   CO2 26 08/23/2014 0526   GLUCOSE 77 08/23/2014 0526   BUN 13 08/23/2014 0526   CREATININE 1.41* 08/23/2014 0526   CALCIUM 8.4* 08/23/2014 0526   PROT 8.3* 08/21/2014 1008   ALBUMIN 3.6 08/21/2014 1008   AST 18 08/21/2014 1008   ALT 24 08/21/2014 1008   ALKPHOS 56 08/21/2014 1008   BILITOT 1.0 08/21/2014 1008   GFRNONAA 58* 08/23/2014 0526   GFRAA >60 08/23/2014 0526   Lipase     Component Value Date/Time   LIPASE 27 08/21/2014 1008       Studies/Results: Ct Abdomen Pelvis W Contrast  08/21/2014   CLINICAL DATA:  Two days of progressive abdominal pain  EXAM: CT ABDOMEN AND PELVIS WITH CONTRAST  TECHNIQUE: Multidetector CT imaging of the abdomen and pelvis was performed using the  standard protocol following bolus administration of intravenous contrast. Oral contrast was also administered.  CONTRAST:  OMNIPAQUE IOHEXOL 300 MG/ML  SOLN  COMPARISON:  July 30, 2014  FINDINGS: Lung bases are clear.  There appears to be mild hepatic steatosis. No focal liver lesions are identified. Gallbladder wall is not thickened. There is no biliary duct dilatation.  Spleen, pancreas, and adrenals appear normal.  There is a nonobstructing 5 mm calculus in the mid right kidney. No other intrarenal calculi are identified. There is no demonstrable renal mass or hydronephrosis on either side. There are no ureteral calculi on either side.  In the pelvis, urinary bladder is midline with normal wall thickness. There is no pelvic mass. There is fat in each inguinal ring.  There is again noted wall thickening in the sigmoid colon with surrounding mesenteric inflammation consistent with diverticulitis. There is extraluminal air in the proximal sigmoid colon region with surrounding inflammation, consistent with a degree of perforation. There is slightly more extraluminal air in this area compared to recent prior study, although this air is confined to the left pelvic region. There is slightly more fatty inflammation in this area compared to prior study. There is loculated fluid in this area, although there is no well-defined abscess  in this region currently.  There is no bowel obstruction. No free air or portal venous air. Appendix is absent.  There is no ascites, adenopathy, or abscess in the abdomen or pelvis. There is atherosclerotic change in the distal aorta but no aneurysm. There is degenerative change in the lumbar spine.  There is a benign appearing lesion in the superior left acetabulum measuring 3.2 x 1.7 cm. A similar appearing lesion is also present in the intertrochanteric left femur region measuring 2.1 x 1.6 cm. No destructive bone lesions are appreciable.  IMPRESSION: Diverticulitis in the proximal  sigmoid colon with evidence of perforation again noted. There is slightly more extraluminal air in this area compared to the previous study with slightly more involvement of surrounding fat. A well-defined abscess is not seen. Well-defined abscess is not currently appreciable.  No other inflammatory foci are identified elsewhere. Note that inflammation extends into the left inguinal ring with fatty stranding, stable from previous study.  Elsewhere, there is no bowel obstruction. No abscess. Appendix absent.  There is a 5 mm calculus in the right kidney, nonobstructing. No ureteral calculi. No hydronephrosis. Inflammation extends to the level of the superior urinary bladder; the urinary bladder wall does not appear appreciably thickened, however.  Critical Value/emergent results were called by telephone at the time of interpretation on 08/21/2014 at 1:17 pm to [facility], [place] , who verbally acknowledged these results.   Electronically Signed   By: Bretta Bang III M.D.   On: 08/21/2014 13:18    Anti-infectives: Anti-infectives    Start     Dose/Rate Route Frequency Ordered Stop   08/21/14 1630  piperacillin-tazobactam (ZOSYN) IVPB 3.375 g     3.375 g 12.5 mL/hr over 240 Minutes Intravenous Every 8 hours 08/21/14 1553     08/21/14 1445  piperacillin-tazobactam (ZOSYN) IVPB 3.375 g  Status:  Discontinued     3.375 g 12.5 mL/hr over 240 Minutes Intravenous 3 times per day 08/21/14 1432 08/21/14 1552       Assessment/Plan Recurrent diverticulitis with perforation and free air Leukocytosis - 11.3 yesterday AKI - Cr. 1.41   Plan: 1. Re-admit for unresolved/recurrent diverticulitis with slightly large free air than previous scan.  2. Medical management with IV antibiotics (Zosyn Day #3 since failed rocephin/flagyl), NPO, bowel rest, antiemetics  3. SCD's and lovenox 4. Ambulate and IS 5. IV hydration clearing up elevated creatinine 6. Some clinical improvement today. Continue NPO  except sips of clears/ice. 7. Very likely that he will need an operation this admission which will have to consist of hartmans with colostomy. He is aware of this as it was discussed with him last time.    LOS: 1 day    Nonie Hoyer 08/23/2014, 10:43 AM Pager: [phone]

## 2014-08-24 LAB — CBC
HCT: 36.5 % — ABNORMAL LOW (ref 39.0–52.0)
HEMOGLOBIN: 12.2 g/dL — AB (ref 13.0–17.0)
MCH: 27.9 pg (ref 26.0–34.0)
MCHC: 33.4 g/dL (ref 30.0–36.0)
MCV: 83.3 fL (ref 78.0–100.0)
Platelets: 207 10*3/uL (ref 150–400)
RBC: 4.38 MIL/uL (ref 4.22–5.81)
RDW: 13.7 % (ref 11.5–15.5)
WBC: 9.6 10*3/uL (ref 4.0–10.5)

## 2014-08-24 LAB — BASIC METABOLIC PANEL
Anion gap: 10 (ref 5–15)
BUN: 9 mg/dL (ref 6–20)
CALCIUM: 8.3 mg/dL — AB (ref 8.9–10.3)
CO2: 25 mmol/L (ref 22–32)
Chloride: 101 mmol/L (ref 101–111)
Creatinine, Ser: 1.31 mg/dL — ABNORMAL HIGH (ref 0.61–1.24)
GFR calc Af Amer: >60 mL/min (ref >60)
GLUCOSE: 92 mg/dL (ref 65–99)
POTASSIUM: 4 mmol/L (ref 3.5–5.1)
Sodium: 136 mmol/L (ref 135–145)

## 2014-08-24 MED ORDER — POLYETHYLENE GLYCOL 3350 17 GM/SCOOP PO POWD
1.0000 | Freq: Once | ORAL | Status: AC
  Administered 2014-08-24: 255 g via ORAL
  Filled 2014-08-24: qty 255

## 2014-08-24 NOTE — Progress Notes (Signed)
[facility] Progress Note     Subjective: Pt doing well.  No N/V, still has pain in LLQ with ambulation.  He says he did 25 laps yesterday.  Passing good flatus.  No BM yet.  Says he's drinking coke, water and ice.    Objective: Vital signs in last 24 hours: Temp:  [97.9 F (36.6 C)-98.1 F (36.7 C)] 98.1 F (36.7 C) (08/08 0530) Pulse Rate:  [68-72] 68 (08/08 0530) Resp:  [16-18] 16 (08/08 0530) BP: (116-128)/(69-89) 116/69 mmHg (08/08 0530) SpO2:  [98 %-99 %] 98 % (08/08 0530) Last BM Date: 08/19/14  Intake/Output from previous day: 08/07 0701 - 08/08 0700 In: 1290 [P.O.:240; I.V.:1000; IV Piggyback:50] Out: 1775 [Urine:1775] Intake/Output this shift: Total I/O In: -  Out: 625 [Urine:625]  PE: Gen:  Alert, NAD, pleasant Abd: Obese, soft, ND, less tender, but still present in LLQ, +BS, no HSM, appendectomy scar in RLQ   Lab Results:   Recent Labs  08/21/14 1008 08/22/14 0326  WBC 12.0* 11.3*  HGB 14.1 12.5*  HCT 41.7 37.7*  PLT 206 207   BMET  Recent Labs  08/22/14 0326 08/23/14 0526  NA 136 138  K 4.5 4.0  CL 101 103  CO2 25 26  GLUCOSE 84 77  BUN 12 13  CREATININE 1.65* 1.41*  CALCIUM 8.3* 8.4*   PT/INR No results for input(s): LABPROT, INR in the last 72 hours. CMP     Component Value Date/Time   NA 138 08/23/2014 0526   K 4.0 08/23/2014 0526   CL 103 08/23/2014 0526   CO2 26 08/23/2014 0526   GLUCOSE 77 08/23/2014 0526   BUN 13 08/23/2014 0526   CREATININE 1.41* 08/23/2014 0526   CALCIUM 8.4* 08/23/2014 0526   PROT 8.3* 08/21/2014 1008   ALBUMIN 3.6 08/21/2014 1008   AST 18 08/21/2014 1008   ALT 24 08/21/2014 1008   ALKPHOS 56 08/21/2014 1008   BILITOT 1.0 08/21/2014 1008   GFRNONAA 58* 08/23/2014 0526   GFRAA >60 08/23/2014 0526   Lipase     Component Value Date/Time   LIPASE 27 08/21/2014 1008       Studies/Results: No results found.  Anti-infectives: Anti-infectives    Start     Dose/Rate Route  Frequency Ordered Stop   08/21/14 1630  piperacillin-tazobactam (ZOSYN) IVPB 3.375 g     3.375 g 12.5 mL/hr over 240 Minutes Intravenous Every 8 hours 08/21/14 1553     08/21/14 1445  piperacillin-tazobactam (ZOSYN) IVPB 3.375 g  Status:  Discontinued     3.375 g 12.5 mL/hr over 240 Minutes Intravenous 3 times per day 08/21/14 1432 08/21/14 1552       Assessment/Plan Recurrent diverticulitis with perforation and free air Leukocytosis  AKI - Cr. 1.41 yesterday H/o appendectomy  Plan: 1. Re-admit for unresolved/recurrent diverticulitis with slightly large free air than previous scan.  2. Medical management with IV antibiotics (Zosyn Day #4 since failed rocephin/flagyl), antiemetics  3. SCD's and lovenox 4. Ambulate and IS 5. IV hydration clearing up elevated creatinine 6. Some clinical improvement today. Start clear's with miralax prep. 7. Very likely that he will need an operation this admission.   8.  Repeat labs pending.  Will start gentle prep to see if we could do this as a one stage procedure    LOS: 2 days    Nonie Hoyer 08/24/2014, 8:43 AM Pager: [phone]

## 2014-08-24 NOTE — Progress Notes (Signed)
Initial Nutrition Assessment  DOCUMENTATION CODES:   Obesity unspecified  INTERVENTION:   -RD will follow for diet advancement and supplement diet as appropriate   NUTRITION DIAGNOSIS:   Inadequate oral intake related to inability to eat as evidenced by NPO status.  GOAL:   Patient will meet greater than or equal to 90% of their needs  MONITOR:   PO intake, Diet advancement, Labs, Weight trends, Skin, I & O's  REASON FOR ASSESSMENT:   Malnutrition Screening Tool    ASSESSMENT:   44 y/o obese white male presented to [facility] with recurrent LLQ abdominal pain. He was recently admitted and placed on IV Rocephin and Flagyl for diverticulitis with microperforation and for a UTI. He was treated with medical management and did not require surgical interventions. His diet was slowly advanced and discharged home on 08/03/14 with 1 week of antibiotics. He was doing well at home for 2 weeks. Wednesday he saw his PCP and was still feeling okay, was found to have a benign exam and blood in urine. He says since Wednesday afternoon the pain has significantly worsened and he has had chills, severe LLQ abdominal pain 9/10.  Pt admitted for recurrent diverticulitis. Per surgery notes, plan for potential Hartmann's procedure with colostomy.   Nurse tech working with pt at time of visit; unable to perform nutrition-focused physical exam at this time.   Reviewed wt hx. UBW around 250#. Noted wt gain PTA.   Spoke with RN, who reports pt was just advanced to a clear liquid diet. He is tolerating liquids well, per her report. He has been started on a bowel regimen to "clear out his colon before surgery".   Diet Order:  Diet clear liquid Room service appropriate?: Yes; Fluid consistency:: Thin  Skin:  Reviewed, no issues  Last BM:  08/19/14  Height:   Ht Readings from Last 1 Encounters:  08/21/14  (1.778 m)    Weight:   Wt Readings from Last 1 Encounters:  08/21/14 270 lb (122.471  kg)    Ideal Body Weight:  75.5 kg  BMI:  Body mass index is 38.74 kg/(m^2).  Estimated Nutritional Needs:   Kcal:  2100-2300  Protein:  95-120 grams  Fluid:  2.2-2.3 L  EDUCATION NEEDS:   No education needs identified at this time  Tekeisha Hakim A. Mayford Knife, RD, LDN, CDE Pager: [phone] After hours Pager: [phone]

## 2014-08-25 LAB — SURGICAL PCR SCREEN
MRSA, PCR: NEGATIVE
STAPHYLOCOCCUS AUREUS: NEGATIVE

## 2014-08-25 MED ORDER — DEXTROSE 5 % IV SOLN
2.0000 g | INTRAVENOUS | Status: AC
  Administered 2014-08-26: 2 g via INTRAVENOUS
  Filled 2014-08-25 (×2): qty 2

## 2014-08-25 MED ORDER — DEXTROSE 5 % IV SOLN: 2.0000 g | INTRAVENOUS | Status: DC

## 2014-08-25 MED ORDER — CHLORHEXIDINE GLUCONATE CLOTH 2 % EX PADS
6.0000 | MEDICATED_PAD | Freq: Once | CUTANEOUS | Status: AC
  Administered 2014-08-26: 6 via TOPICAL

## 2014-08-25 NOTE — Progress Notes (Signed)
[facility] Progress Note     Subjective: Pt feels okay.  No N/V, abdominal pain still present with palpation and movement.  Having lots of BM's since prep started.  Starting to get clearer.  He's really wanting to proceed with surgery because he doesn't want to have to deal with this again.    Objective: Vital signs in last 24 hours: Temp:  [98 F (36.7 C)-99.8 F (37.7 C)] 98 F (36.7 C) (08/09 0547) Pulse Rate:  [66-76] 66 (08/09 0547) Resp:  [18] 18 (08/09 0547) BP: (115-134)/(78-89) 116/78 mmHg (08/09 0547) SpO2:  [95 %-98 %] 98 % (08/09 0547) Last BM Date: 08/24/14  Intake/Output from previous day: 08/08 0701 - 08/09 0700 In: 3110 [P.O.:2060; I.V.:1000; IV Piggyback:50] Out: 2078 [Urine:2076; Stool:2] Intake/Output this shift:    PE: Gen:  Alert, NAD, pleasant Card:  RRR, no M/G/R heard Pulm:  CTA, no W/R/R Abd: Obese, soft, ND, less tender, but still present in LLQ, +BS, no HSM, appendectomy scar in RLQ   Lab Results:   Recent Labs  08/24/14 1000  WBC 9.6  HGB 12.2*  HCT 36.5*  PLT 207   BMET  Recent Labs  08/23/14 0526 08/24/14 1000  NA 138 136  K 4.0 4.0  CL 103 101  CO2 26 25  GLUCOSE 77 92  BUN 13 9  CREATININE 1.41* 1.31*  CALCIUM 8.4* 8.3*   PT/INR No results for input(s): LABPROT, INR in the last 72 hours. CMP     Component Value Date/Time   NA 136 08/24/2014 1000   K 4.0 08/24/2014 1000   CL 101 08/24/2014 1000   CO2 25 08/24/2014 1000   GLUCOSE 92 08/24/2014 1000   BUN 9 08/24/2014 1000   CREATININE 1.31* 08/24/2014 1000   CALCIUM 8.3* 08/24/2014 1000   PROT 8.3* 08/21/2014 1008   ALBUMIN 3.6 08/21/2014 1008   AST 18 08/21/2014 1008   ALT 24 08/21/2014 1008   ALKPHOS 56 08/21/2014 1008   BILITOT 1.0 08/21/2014 1008   GFRNONAA >60 08/24/2014 1000   GFRAA >60 08/24/2014 1000   Lipase     Component Value Date/Time   LIPASE 27 08/21/2014 1008       Studies/Results: No results  found.  Anti-infectives: Anti-infectives    Start     Dose/Rate Route Frequency Ordered Stop   08/21/14 1630  piperacillin-tazobactam (ZOSYN) IVPB 3.375 g     3.375 g 12.5 mL/hr over 240 Minutes Intravenous Every 8 hours 08/21/14 1553     08/21/14 1445  piperacillin-tazobactam (ZOSYN) IVPB 3.375 g  Status:  Discontinued     3.375 g 12.5 mL/hr over 240 Minutes Intravenous 3 times per day 08/21/14 1432 08/21/14 1552       Assessment/Plan Recurrent diverticulitis with perforation and free air Leukocytosis - resolved AKI - Cr. 1.31 yesterday H/o appendectomy  Plan: 1. Re-admit for unresolved/recurrent diverticulitis with slightly large free air than previous scan.  2. Medical management with IV antibiotics (Zosyn Day #5 since failed rocephin/flagyl), antiemetics  3. SCD's and lovenox 4. Ambulate and IS 5. IV hydration clearing up elevated creatinine 6. Patient is the same today, no significant improvement overnight.  Continue clear's with miralax prep. 7. Possible plan for surgery tomorrow 8. Possible we could do a one stage procedure since he's successfully prepped, but may need ileostomy. 9.  Consider repeat CT scan, last one 4 days ago    LOS: 3 days    Nonie Hoyer 08/25/2014, 8:04 AM Pager: [phone]

## 2014-08-25 NOTE — Care Management Note (Signed)
Case Management Note  Patient Details  Name: Bryan [name] MRN: [id number] Date of Birth: March 23, 1970  Subjective/Objective:                    Action/Plan: UR updated . Will wait post op to assess needs   Expected Discharge Date:                  Expected Discharge Plan:     In-House Referral:     Discharge planning Services     Post Acute Care Choice:    Choice offered to:     DME Arranged:    DME Agency:     HH Arranged:    HH Agency:     Status of Service:  In process, will continue to follow  Medicare Important Message Given:    Date Medicare IM Given:    Medicare IM give by:    Date Additional Medicare IM Given:    Additional Medicare Important Message give by:     If discussed at Long Length of Stay Meetings, dates discussed:    Additional Comments:  Kingsley Plan, RN 08/25/2014, 1:14 PM

## 2014-08-26 ENCOUNTER — Inpatient Hospital Stay (HOSPITAL_COMMUNITY): Payer: BLUE CROSS/BLUE SHIELD | Admitting: Anesthesiology

## 2014-08-26 ENCOUNTER — Encounter (HOSPITAL_COMMUNITY): Admission: EM | Disposition: A | Discharge status: Home Health | Payer: Self-pay | Source: Home / Self Care

## 2014-08-26 HISTORY — PX: LAPAROSCOPIC PARTIAL COLECTOMY: SHX5907

## 2014-08-26 LAB — HEMOGLOBIN A1C
Hgb A1c MFr Bld: 6.1 % — ABNORMAL HIGH (ref 4.8–5.6)
MEAN PLASMA GLUCOSE: 128 mg/dL

## 2014-08-26 SURGERY — LAPAROSCOPIC PARTIAL COLECTOMY
Anesthesia: General | Site: Abdomen

## 2014-08-26 MED ORDER — NALOXONE HCL 0.4 MG/ML IJ SOLN: 0.4000 mg | INTRAMUSCULAR | Status: DC | PRN

## 2014-08-26 MED ORDER — PROPOFOL 10 MG/ML IV BOLUS
INTRAVENOUS | Status: AC
  Filled 2014-08-26: qty 20

## 2014-08-26 MED ORDER — HYDROMORPHONE 0.3 MG/ML IV SOLN
INTRAVENOUS | Status: AC
  Filled 2014-08-26: qty 25

## 2014-08-26 MED ORDER — LIDOCAINE HCL (CARDIAC) 20 MG/ML IV SOLN
INTRAVENOUS | Status: DC | PRN
  Administered 2014-08-26: 80 mg via INTRAVENOUS

## 2014-08-26 MED ORDER — DIPHENHYDRAMINE HCL 50 MG/ML IJ SOLN: 12.5000 mg | Freq: Four times a day (QID) | INTRAMUSCULAR | Status: DC | PRN

## 2014-08-26 MED ORDER — 0.9 % SODIUM CHLORIDE (POUR BTL) OPTIME
TOPICAL | Status: DC | PRN
Start: 2014-08-26 — End: 2014-08-26
  Administered 2014-08-26: 1000 mL

## 2014-08-26 MED ORDER — LACTATED RINGERS IV SOLN
INTRAVENOUS | Status: DC
  Administered 2014-08-26 (×3): via INTRAVENOUS

## 2014-08-26 MED ORDER — SODIUM CHLORIDE 0.9 % IJ SOLN: 9.0000 mL | INTRAMUSCULAR | Status: DC | PRN

## 2014-08-26 MED ORDER — 0.9 % SODIUM CHLORIDE (POUR BTL) OPTIME
TOPICAL | Status: DC | PRN
  Administered 2014-08-26: 1000 mL

## 2014-08-26 MED ORDER — LIDOCAINE HCL (CARDIAC) 20 MG/ML IV SOLN
INTRAVENOUS | Status: AC
  Filled 2014-08-26: qty 5

## 2014-08-26 MED ORDER — SUCCINYLCHOLINE CHLORIDE 20 MG/ML IJ SOLN
INTRAMUSCULAR | Status: DC | PRN
  Administered 2014-08-26: 120 mg via INTRAVENOUS

## 2014-08-26 MED ORDER — BUPIVACAINE-EPINEPHRINE (PF) 0.25% -1:200000 IJ SOLN
INTRAMUSCULAR | Status: AC
  Filled 2014-08-26: qty 30

## 2014-08-26 MED ORDER — SODIUM CHLORIDE 0.9 % IR SOLN
Status: DC | PRN
  Administered 2014-08-26: 1

## 2014-08-26 MED ORDER — HYDROMORPHONE HCL 1 MG/ML IJ SOLN
INTRAMUSCULAR | Status: AC
  Administered 2014-08-26: 0.5 mg via INTRAVENOUS
  Filled 2014-08-26: qty 1

## 2014-08-26 MED ORDER — MIDAZOLAM HCL 2 MG/2ML IJ SOLN
INTRAMUSCULAR | Status: AC
  Filled 2014-08-26: qty 4

## 2014-08-26 MED ORDER — BUPIVACAINE-EPINEPHRINE (PF) 0.25% -1:200000 IJ SOLN
INTRAMUSCULAR | Status: DC | PRN
  Administered 2014-08-26: 5 mL

## 2014-08-26 MED ORDER — FENTANYL CITRATE (PF) 250 MCG/5ML IJ SOLN
INTRAMUSCULAR | Status: AC
  Filled 2014-08-26: qty 5

## 2014-08-26 MED ORDER — ROCURONIUM BROMIDE 50 MG/5ML IV SOLN
INTRAVENOUS | Status: AC
  Filled 2014-08-26: qty 1

## 2014-08-26 MED ORDER — PHENYLEPHRINE HCL 10 MG/ML IJ SOLN
INTRAMUSCULAR | Status: DC | PRN
  Administered 2014-08-26 (×3): 80 ug via INTRAVENOUS

## 2014-08-26 MED ORDER — PROPOFOL 10 MG/ML IV BOLUS
INTRAVENOUS | Status: DC | PRN
  Administered 2014-08-26: 150 mg via INTRAVENOUS

## 2014-08-26 MED ORDER — PROMETHAZINE HCL 25 MG/ML IJ SOLN: 6.2500 mg | INTRAMUSCULAR | Status: DC | PRN

## 2014-08-26 MED ORDER — ROCURONIUM BROMIDE 100 MG/10ML IV SOLN
INTRAVENOUS | Status: DC | PRN
  Administered 2014-08-26: 20 mg via INTRAVENOUS
  Administered 2014-08-26: 50 mg via INTRAVENOUS
  Administered 2014-08-26: 20 mg via INTRAVENOUS

## 2014-08-26 MED ORDER — MIDAZOLAM HCL 5 MG/5ML IJ SOLN
INTRAMUSCULAR | Status: DC | PRN
  Administered 2014-08-26: 2 mg via INTRAVENOUS

## 2014-08-26 MED ORDER — FENTANYL CITRATE (PF) 100 MCG/2ML IJ SOLN
INTRAMUSCULAR | Status: DC | PRN
  Administered 2014-08-26 (×3): 50 ug via INTRAVENOUS
  Administered 2014-08-26: 100 ug via INTRAVENOUS

## 2014-08-26 MED ORDER — HYDROMORPHONE 0.3 MG/ML IV SOLN
INTRAVENOUS | Status: AC
  Administered 2014-08-26: 2.7 mg via INTRAVENOUS
  Administered 2014-08-26: 3.6 mg via INTRAVENOUS
  Administered 2014-08-26 (×2): via INTRAVENOUS
  Administered 2014-08-27: 4.2 mg via INTRAVENOUS
  Administered 2014-08-27: 4.8 mg via INTRAVENOUS
  Administered 2014-08-27: 05:00:00 via INTRAVENOUS
  Administered 2014-08-27: 3.3 mg via INTRAVENOUS
  Administered 2014-08-27: 6.6 mg via INTRAVENOUS
  Administered 2014-08-27: 18:00:00 via INTRAVENOUS
  Administered 2014-08-27: 7.5 mg via INTRAVENOUS
  Administered 2014-08-27: 11:00:00 via INTRAVENOUS
  Administered 2014-08-28: 3.3 mg via INTRAVENOUS
  Administered 2014-08-28 (×2): 7.5 mg via INTRAVENOUS
  Administered 2014-08-28 (×3): via INTRAVENOUS
  Administered 2014-08-29 (×2): 2.4 mg via INTRAVENOUS
  Administered 2014-08-29: 1.89 mg via INTRAVENOUS
  Filled 2014-08-26 (×8): qty 25

## 2014-08-26 MED ORDER — GLYCOPYRROLATE 0.2 MG/ML IJ SOLN
INTRAMUSCULAR | Status: DC | PRN
  Administered 2014-08-26: 0.6 mg via INTRAVENOUS

## 2014-08-26 MED ORDER — NEOSTIGMINE METHYLSULFATE 10 MG/10ML IV SOLN
INTRAVENOUS | Status: DC | PRN
Start: 2014-08-26 — End: 2014-08-26
  Administered 2014-08-26: 4 mg via INTRAVENOUS

## 2014-08-26 MED ORDER — ONDANSETRON HCL 4 MG/2ML IJ SOLN
INTRAMUSCULAR | Status: DC | PRN
  Administered 2014-08-26: 4 mg via INTRAVENOUS

## 2014-08-26 MED ORDER — ONDANSETRON HCL 4 MG/2ML IJ SOLN
4.0000 mg | Freq: Four times a day (QID) | INTRAMUSCULAR | Status: DC | PRN
Start: 2014-08-26 — End: 2014-08-26

## 2014-08-26 MED ORDER — DIPHENHYDRAMINE HCL 12.5 MG/5ML PO ELIX: 12.5000 mg | ORAL_SOLUTION | Freq: Four times a day (QID) | ORAL | Status: DC | PRN

## 2014-08-26 MED ORDER — HYDROMORPHONE HCL 1 MG/ML IJ SOLN
0.2500 mg | INTRAMUSCULAR | Status: DC | PRN
  Administered 2014-08-26 (×4): 0.5 mg via INTRAVENOUS

## 2014-08-26 SURGICAL SUPPLY — 85 items
APPLIER CLIP 5 13 M/L LIGAMAX5 (MISCELLANEOUS)
APPLIER CLIP ROT 10 11.4 M/L (STAPLE)
BLADE SURG ROTATE 9660 (MISCELLANEOUS) IMPLANT
BNDG GAUZE ELAST 4 BULKY (GAUZE/BANDAGES/DRESSINGS) ×3 IMPLANT
CANISTER SUCTION 2500CC (MISCELLANEOUS) ×3 IMPLANT
CATH ROBINSON RED A/P 16FR (CATHETERS) ×3 IMPLANT
CELLS DAT CNTRL 66122 CELL SVR (MISCELLANEOUS) IMPLANT
CHLORAPREP W/TINT 26ML (MISCELLANEOUS) ×3 IMPLANT
CLIP APPLIE 5 13 M/L LIGAMAX5 (MISCELLANEOUS) IMPLANT
CLIP APPLIE ROT 10 11.4 M/L (STAPLE) IMPLANT
COVER MAYO STAND STRL (DRAPES) ×3 IMPLANT
COVER SURGICAL LIGHT HANDLE (MISCELLANEOUS) ×3 IMPLANT
DRAIN CHANNEL 19F RND (DRAIN) ×3 IMPLANT
DRAPE PROXIMA HALF (DRAPES) ×3 IMPLANT
DRAPE UTILITY XL STRL (DRAPES) ×3 IMPLANT
DRAPE WARM FLUID 44X44 (DRAPE) ×3 IMPLANT
DRSG OPSITE POSTOP 4X10 (GAUZE/BANDAGES/DRESSINGS) IMPLANT
DRSG OPSITE POSTOP 4X6 (GAUZE/BANDAGES/DRESSINGS) ×3 IMPLANT
DRSG OPSITE POSTOP 4X8 (GAUZE/BANDAGES/DRESSINGS) IMPLANT
DRSG PAD ABDOMINAL 8X10 ST (GAUZE/BANDAGES/DRESSINGS) ×3 IMPLANT
ELECT BLADE 6.5 EXT (BLADE) ×3 IMPLANT
ELECT CAUTERY BLADE 6.4 (BLADE) ×3 IMPLANT
ELECT REM PT RETURN 9FT ADLT (ELECTROSURGICAL) ×3
ELECTRODE REM PT RTRN 9FT ADLT (ELECTROSURGICAL) ×1 IMPLANT
EVACUATOR SILICONE 100CC (DRAIN) ×3 IMPLANT
GAUZE SPONGE 2X2 8PLY STRL LF (GAUZE/BANDAGES/DRESSINGS) ×1 IMPLANT
GAUZE SPONGE 4X4 16PLY XRAY LF (GAUZE/BANDAGES/DRESSINGS) ×3 IMPLANT
GEL ULTRASOUND 20GR AQUASONIC (MISCELLANEOUS) IMPLANT
GLOVE BIO SURGEON STRL SZ7 (GLOVE) ×3 IMPLANT
GLOVE BIO SURGEON STRL SZ8 (GLOVE) ×9 IMPLANT
GLOVE BIOGEL PI IND STRL 7.0 (GLOVE) ×4 IMPLANT
GLOVE BIOGEL PI IND STRL 8 (GLOVE) ×1 IMPLANT
GLOVE BIOGEL PI INDICATOR 7.0 (GLOVE) ×8
GLOVE BIOGEL PI INDICATOR 8 (GLOVE) ×2
GLOVE SURG SS PI 7.0 STRL IVOR (GLOVE) ×3 IMPLANT
GOWN STRL REUS W/ TWL LRG LVL3 (GOWN DISPOSABLE) ×2 IMPLANT
GOWN STRL REUS W/ TWL XL LVL3 (GOWN DISPOSABLE) ×1 IMPLANT
GOWN STRL REUS W/TWL LRG LVL3 (GOWN DISPOSABLE) ×4
GOWN STRL REUS W/TWL XL LVL3 (GOWN DISPOSABLE) ×2
KIT BASIN OR (CUSTOM PROCEDURE TRAY) ×3 IMPLANT
KIT OSTOMY DRAINABLE 2.75 STR (WOUND CARE) ×3 IMPLANT
KIT ROOM TURNOVER OR (KITS) ×3 IMPLANT
LEGGING LITHOTOMY PAIR STRL (DRAPES) ×3 IMPLANT
LIGASURE IMPACT 36 18CM CVD LR (INSTRUMENTS) ×3 IMPLANT
NS IRRIG 1000ML POUR BTL (IV SOLUTION) ×6 IMPLANT
PAD ARMBOARD 7.5X6 YLW CONV (MISCELLANEOUS) ×6 IMPLANT
PENCIL BUTTON HOLSTER BLD 10FT (ELECTRODE) ×3 IMPLANT
RELOAD PROXIMATE 75MM BLUE (ENDOMECHANICALS) ×3 IMPLANT
RTRCTR WOUND ALEXIS 18CM MED (MISCELLANEOUS)
SCALPEL HARMONIC ACE (MISCELLANEOUS) ×3 IMPLANT
SCISSORS LAP 5X35 DISP (ENDOMECHANICALS) IMPLANT
SET IRRIG TUBING LAPAROSCOPIC (IRRIGATION / IRRIGATOR) ×3 IMPLANT
SLEEVE ENDOPATH XCEL 5M (ENDOMECHANICALS) ×6 IMPLANT
SPECIMEN JAR LARGE (MISCELLANEOUS) ×3 IMPLANT
SPONGE GAUZE 2X2 STER 10/PKG (GAUZE/BANDAGES/DRESSINGS) ×2
SPONGE LAP 18X18 X RAY DECT (DISPOSABLE) ×6 IMPLANT
STAPLER PROXIMATE 75MM BLUE (STAPLE) ×3 IMPLANT
STAPLER VISISTAT 35W (STAPLE) ×3 IMPLANT
SUCTION POOLE TIP (SUCTIONS) ×3 IMPLANT
SURGILUBE 2OZ TUBE FLIPTOP (MISCELLANEOUS) IMPLANT
SUT ETHILON 2 0 FS 18 (SUTURE) ×6 IMPLANT
SUT PROLENE 2 0 CT2 30 (SUTURE) IMPLANT
SUT PROLENE 2 0 KS (SUTURE) IMPLANT
SUT VIC AB 2-0 SH 18 (SUTURE) ×15 IMPLANT
SUT VIC AB 3-0 SH 18 (SUTURE) ×3 IMPLANT
SUT VICRYL AB 2 0 TIES (SUTURE) ×3 IMPLANT
SUT VICRYL AB 3 0 TIES (SUTURE) ×3 IMPLANT
SYR BULB IRRIGATION 50ML (SYRINGE) ×3 IMPLANT
SYS LAPSCP GELPORT 120MM (MISCELLANEOUS) ×3
SYSTEM LAPSCP GELPORT 120MM (MISCELLANEOUS) ×1 IMPLANT
TAPE CLOTH SURG 4X10 WHT LF (GAUZE/BANDAGES/DRESSINGS) ×3 IMPLANT
TAPE CLOTH SURG 6X10 WHT LF (GAUZE/BANDAGES/DRESSINGS) ×3 IMPLANT
TOWEL OR 17X26 10 PK STRL BLUE (TOWEL DISPOSABLE) ×3 IMPLANT
TRAY FOLEY CATH 16FRSI W/METER (SET/KITS/TRAYS/PACK) ×3 IMPLANT
TRAY LAPAROSCOPIC MC (CUSTOM PROCEDURE TRAY) ×3 IMPLANT
TRAY PROCTOSCOPIC FIBER OPTIC (SET/KITS/TRAYS/PACK) IMPLANT
TROCAR XCEL 12X100 BLDLESS (ENDOMECHANICALS) IMPLANT
TROCAR XCEL BLUNT TIP 100MML (ENDOMECHANICALS) IMPLANT
TROCAR XCEL NON-BLD 11X100MML (ENDOMECHANICALS) IMPLANT
TROCAR XCEL NON-BLD 5MMX100MML (ENDOMECHANICALS) ×3 IMPLANT
TUBE CONNECTING 12'X1/4 (SUCTIONS) ×1
TUBE CONNECTING 12X1/4 (SUCTIONS) ×2 IMPLANT
TUBING FILTER THERMOFLATOR (ELECTROSURGICAL) ×3 IMPLANT
TUBING INSUFFLATION (TUBING) ×3 IMPLANT
YANKAUER SUCT BULB TIP NO VENT (SUCTIONS) ×3 IMPLANT

## 2014-08-26 NOTE — Progress Notes (Signed)
Called report to Short stay RN, Marcelino Duster.

## 2014-08-26 NOTE — Brief Op Note (Signed)
08/21/2014 - 08/26/2014  2:37 PM  PATIENT:  Bryan [name]  44 y.o. male  PRE-OPERATIVE DIAGNOSIS:  DIVERTICULITIS  POST-OPERATIVE DIAGNOSIS:  DIVERTICULITIS  PROCEDURE:  Procedure(s): LAPAROSCOPIC ASSISTED PARTIAL COLECTOMY WITH OSTOMY (N/A)  SURGEON:  Surgeon(s) and Role:    Harriette Bouillon, MD - Primary      ANESTHESIA:   general  EBL:  Total I/O In: 1000 [I.V.:1000] Out: 1000 [Urine:1000]  BLOOD ADMINISTERED:none  DRAINS: (19) Jackson-Pratt drain(s) with closed bulb suction in the pelvis    LOCAL MEDICATIONS USED:  BUPIVICAINE   SPECIMEN:  Source of Specimen:  sigmoid colon   DISPOSITION OF SPECIMEN:  PATHOLOGY  COUNTS:  YES  TOURNIQUET:  * No tourniquets in log *  DICTATION: .Other Dictation: Dictation Number  [id number]  PLAN OF CARE: Admit to inpatient   PATIENT DISPOSITION:  PACU - hemodynamically stable.   Delay start of Pharmacological VTE agent (>24hrs) due to surgical blood loss or risk of bleeding: no

## 2014-08-26 NOTE — Anesthesia Postprocedure Evaluation (Signed)
  Anesthesia Post-op Note  Patient: Bryan [name]  Procedure(s) Performed: Procedure(s) (LRB): LAPAROSCOPIC ASSISTED PARTIAL COLECTOMY WITH OSTOMY (N/A)  Patient Location: PACU  Anesthesia Type: General  Level of Consciousness: awake and alert   Airway and Oxygen Therapy: Patient Spontanous Breathing  Post-op Pain: mild  Post-op Assessment: Post-op Vital signs reviewed, Patient's Cardiovascular Status Stable, Respiratory Function Stable, Patent Airway and No signs of Nausea or vomiting  Last Vitals:  Filed Vitals:   08/26/14 1615  BP:   Pulse: 81  Temp:   Resp: 18    Post-op Vital Signs: stable   Complications: No apparent anesthesia complications

## 2014-08-26 NOTE — Anesthesia Procedure Notes (Signed)
Procedure Name: Intubation Date/Time: 08/26/2014 11:57 AM Performed by: Arlice Colt B Pre-anesthesia Checklist: Patient identified, Emergency Drugs available, Suction available, Patient being monitored and Timeout performed Patient Re-evaluated:Patient Re-evaluated prior to inductionOxygen Delivery Method: Circle system utilized Preoxygenation: Pre-oxygenation with 100% oxygen Intubation Type: IV induction and Rapid sequence Laryngoscope Size: Mac and 3 Grade View: Grade I Tube type: Oral Tube size: 7.5 mm Number of attempts: 1 Airway Equipment and Method: Stylet Placement Confirmation: ETT inserted through vocal cords under direct vision,  positive ETCO2 and breath sounds checked- equal and bilateral Secured at: 22 cm Tube secured with: Tape Dental Injury: Teeth and Oropharynx as per pre-operative assessment

## 2014-08-26 NOTE — Interval H&P Note (Signed)
History and Physical Interval Note:  08/26/2014 11:01 AM  Bryan [name]  has presented today for surgery, with the diagnosis of DIVERTICULITIS  The various methods of treatment have been discussed with the patient and family. After consideration of risks, benefits and other options for treatment, the patient has consented to  Procedure(s): LAPAROSCOPIC ASSISTED PARTIAL COLECTOMY WITH POSSIBLE OSTOMY (N/A) as a surgical intervention .  The patient's history has been reviewed, patient examined, no change in status, stable for surgery.  I have reviewed the patient's chart and labs.  Questions were answered to the patient's satisfaction.     [name] A.

## 2014-08-26 NOTE — Anesthesia Preprocedure Evaluation (Addendum)
Anesthesia Evaluation  Patient identified by MRN, date of birth, ID band Patient awake    Reviewed: Allergy & Precautions, H&P , NPO status , Patient's Chart, lab work & pertinent test results  History of Anesthesia Complications Negative for: history of anesthetic complications  Airway Mallampati: II  TM Distance: >3 FB Neck ROM: full    Dental no notable dental hx. (+) Teeth Intact, Dental Advisory Given   Pulmonary neg pulmonary ROS,  breath sounds clear to auscultation  Pulmonary exam normal       Cardiovascular negative cardio ROS Normal cardiovascular examRhythm:regular Rate:Normal     Neuro/Psych negative neurological ROS     GI/Hepatic negative GI ROS, Neg liver ROS,   Endo/Other  negative endocrine ROS  Renal/GU Renal disease     Musculoskeletal   Abdominal   Peds  Hematology negative hematology ROS (+)   Anesthesia Other Findings Patient with recurrent diverticulitis on IV abx therapy METS>4  Patient's right eye red and painful when exposed to light.  Reproductive/Obstetrics negative OB ROS                            Anesthesia Physical Anesthesia Plan  ASA: II  Anesthesia Plan: General   Post-op Pain Management:    Induction: Intravenous  Airway Management Planned: Oral ETT  Additional Equipment:   Intra-op Plan:   Post-operative Plan:   Informed Consent: I have reviewed the patients History and Physical, chart, labs and discussed the procedure including the risks, benefits and alternatives for the proposed anesthesia with the patient or authorized representative who has indicated his/her understanding and acceptance.     Plan Discussed with: Anesthesiologist, CRNA and Surgeon  Anesthesia Plan Comments:         Anesthesia Quick Evaluation

## 2014-08-26 NOTE — Transfer of Care (Signed)
Immediate Anesthesia Transfer of Care Note  Patient: Bryan [name]  Procedure(s) Performed: Procedure(s): LAPAROSCOPIC ASSISTED PARTIAL COLECTOMY WITH OSTOMY (N/A)  Patient Location: PACU  Anesthesia Type:General  Level of Consciousness: awake, alert , oriented and sedated  Airway & Oxygen Therapy: Patient Spontanous Breathing and Patient connected to nasal cannula oxygen  Post-op Assessment: Report given to RN, Post -op Vital signs reviewed and stable and Patient moving all extremities  Post vital signs: Reviewed and stable  Last Vitals:  Filed Vitals:   08/26/14 1503  BP:   Pulse:   Temp: 36.3 C  Resp:     Complications: No apparent anesthesia complications

## 2014-08-26 NOTE — H&P (View-Only) (Signed)
[facility] Progress Note     Subjective: Pt feels okay.  No N/V, abdominal pain still present with palpation and movement.  Having lots of BM's since prep started.  Starting to get clearer.  He's really wanting to proceed with surgery because he doesn't want to have to deal with this again.    Objective: Vital signs in last 24 hours: Temp:  [98 F (36.7 C)-99.8 F (37.7 C)] 98 F (36.7 C) (08/09 0547) Pulse Rate:  [66-76] 66 (08/09 0547) Resp:  [18] 18 (08/09 0547) BP: (115-134)/(78-89) 116/78 mmHg (08/09 0547) SpO2:  [95 %-98 %] 98 % (08/09 0547) Last BM Date: 08/24/14  Intake/Output from previous day: 08/08 0701 - 08/09 0700 In: 3110 [P.O.:2060; I.V.:1000; IV Piggyback:50] Out: 2078 [Urine:2076; Stool:2] Intake/Output this shift:    PE: Gen:  Alert, NAD, pleasant Card:  RRR, no M/G/R heard Pulm:  CTA, no W/R/R Abd: Obese, soft, ND, less tender, but still present in LLQ, +BS, no HSM, appendectomy scar in RLQ   Lab Results:   Recent Labs  08/24/14 1000  WBC 9.6  HGB 12.2*  HCT 36.5*  PLT 207   BMET  Recent Labs  08/23/14 0526 08/24/14 1000  NA 138 136  K 4.0 4.0  CL 103 101  CO2 26 25  GLUCOSE 77 92  BUN 13 9  CREATININE 1.41* 1.31*  CALCIUM 8.4* 8.3*   PT/INR No results for input(s): LABPROT, INR in the last 72 hours. CMP     Component Value Date/Time   NA 136 08/24/2014 1000   K 4.0 08/24/2014 1000   CL 101 08/24/2014 1000   CO2 25 08/24/2014 1000   GLUCOSE 92 08/24/2014 1000   BUN 9 08/24/2014 1000   CREATININE 1.31* 08/24/2014 1000   CALCIUM 8.3* 08/24/2014 1000   PROT 8.3* 08/21/2014 1008   ALBUMIN 3.6 08/21/2014 1008   AST 18 08/21/2014 1008   ALT 24 08/21/2014 1008   ALKPHOS 56 08/21/2014 1008   BILITOT 1.0 08/21/2014 1008   GFRNONAA >60 08/24/2014 1000   GFRAA >60 08/24/2014 1000   Lipase     Component Value Date/Time   LIPASE 27 08/21/2014 1008       Studies/Results: No results  found.  Anti-infectives: Anti-infectives    Start     Dose/Rate Route Frequency Ordered Stop   08/21/14 1630  piperacillin-tazobactam (ZOSYN) IVPB 3.375 g     3.375 g 12.5 mL/hr over 240 Minutes Intravenous Every 8 hours 08/21/14 1553     08/21/14 1445  piperacillin-tazobactam (ZOSYN) IVPB 3.375 g  Status:  Discontinued     3.375 g 12.5 mL/hr over 240 Minutes Intravenous 3 times per day 08/21/14 1432 08/21/14 1552       Assessment/Plan Recurrent diverticulitis with perforation and free air Leukocytosis - resolved AKI - Cr. 1.31 yesterday H/o appendectomy  Plan: 1. Re-admit for unresolved/recurrent diverticulitis with slightly large free air than previous scan.  2. Medical management with IV antibiotics (Zosyn Day #5 since failed rocephin/flagyl), antiemetics  3. SCD's and lovenox 4. Ambulate and IS 5. IV hydration clearing up elevated creatinine 6. Patient is the same today, no significant improvement overnight.  Continue clear's with miralax prep. 7. Possible plan for surgery tomorrow 8. Possible we could do a one stage procedure since he's successfully prepped, but may need ileostomy. 9.  Consider repeat CT scan, last one 4 days ago    LOS: 3 days    Jylan Loeza N Zorianna Taliaferro 08/25/2014, 8:04 AM Pager: [phone]   

## 2014-08-27 ENCOUNTER — Encounter (HOSPITAL_COMMUNITY): Payer: Self-pay | Admitting: Surgery

## 2014-08-27 LAB — BASIC METABOLIC PANEL
Anion gap: 7 (ref 5–15)
BUN: <5 mg/dL — ABNORMAL LOW (ref 6–20)
CO2: 29 mmol/L (ref 22–32)
Calcium: 8.4 mg/dL — ABNORMAL LOW (ref 8.9–10.3)
Chloride: 101 mmol/L (ref 101–111)
Creatinine, Ser: 1.35 mg/dL — ABNORMAL HIGH (ref 0.61–1.24)
GFR calc Af Amer: >60 mL/min (ref >60)
GFR calc non Af Amer: >60 mL/min (ref >60)
GLUCOSE: 98 mg/dL (ref 65–99)
POTASSIUM: 4 mmol/L (ref 3.5–5.1)
SODIUM: 137 mmol/L (ref 135–145)

## 2014-08-27 LAB — CBC
HEMATOCRIT: 37.3 % — AB (ref 39.0–52.0)
HEMOGLOBIN: 12.6 g/dL — AB (ref 13.0–17.0)
MCH: 28.1 pg (ref 26.0–34.0)
MCHC: 33.8 g/dL (ref 30.0–36.0)
MCV: 83.1 fL (ref 78.0–100.0)
Platelets: 249 10*3/uL (ref 150–400)
RBC: 4.49 MIL/uL (ref 4.22–5.81)
RDW: 13.8 % (ref 11.5–15.5)
WBC: 11.9 10*3/uL — ABNORMAL HIGH (ref 4.0–10.5)

## 2014-08-27 MED ORDER — PHENOL 1.4 % MT LIQD
1.0000 | OROMUCOSAL | Status: DC | PRN
  Administered 2014-08-27: 1 via OROMUCOSAL
  Filled 2014-08-27: qty 177

## 2014-08-27 MED ORDER — METHOCARBAMOL 1000 MG/10ML IJ SOLN
1000.0000 mg | Freq: Three times a day (TID) | INTRAVENOUS | Status: DC
  Administered 2014-08-27 – 2014-08-30 (×10): 1000 mg via INTRAVENOUS
  Filled 2014-08-27 (×12): qty 10

## 2014-08-27 MED ORDER — ACETAMINOPHEN 10 MG/ML IV SOLN
1000.0000 mg | Freq: Four times a day (QID) | INTRAVENOUS | Status: AC
  Administered 2014-08-27 – 2014-08-28 (×3): 1000 mg via INTRAVENOUS
  Filled 2014-08-27 (×3): qty 100

## 2014-08-27 NOTE — Op Note (Signed)
Bryan [name], Bryan [name]        ACCOUNT NO.:  [id number]  MEDICAL RECORD NO.:  [id number]  LOCATION:  6N11C                        FACILITY:  [facility]  PHYSICIAN:  Marcello Moores A. Arville Postlewaite, M.D.DATE OF BIRTH:  11/29/1970  DATE OF PROCEDURE:  08/26/2014 DATE OF DISCHARGE:                              OPERATIVE REPORT   PREOPERATIVE DIAGNOSIS:  Chronic recurrent sigmoid diverticulitis with failure of medical management x2.  POSTOPERATIVE DIAGNOSES: 1. Chronic recurrent sigmoid diverticulitis with failure of medical     management x2. 2. Sigmoid colon diverticular stricture.  PROCEDURE:  Sigmoid colectomy with hand-sewn primary anastomosis and diversion loop ileostomy.  SURGEON:  Marcello Moores A. Shylie Polo, M.D.  ANESTHESIA:  General endotracheal anesthesia.  LOCAL:  0.25% Sensorcaine local.  ESTIMATED BLOOD LOSS:  Approximately 100 mL.  IV FLUIDS:  1 L crystalloid.  DRAINS:  A 19 round drain to pelvis.  INDICATIONS FOR PROCEDURE:  The patient is a 44 year old male with multiple bouts of diverticulitis.  He has been managed medically and he has had failure of both inpatient and outpatient treatments.  He is having more abdominal pain and loose stools.  His pain has not resolved despite multiple attempts at treating him medically.  I discussed with him his ongoing pain issues now for multiple weeks without resolution of his symptoms and felt that surgical exploration and resection at this point were necessary since he has not gotten better on almost 3.5 to 4 weeks of treatment despite multiple regimens.  I discussed further medical management if he chose to do so.  I discussed the risks of surgery, which include, but not exclusive of, bleeding, infection, injury to ureter, injury to bladder, injury to the iliac vessels, injury to kidney, injury to colon, injury to small bowel, small bowel obstruction, need for ostomy, death, DVT, pulmonary issues, renal issues, wound complications, the  need for further operative procedures down the road.  He understood the above as well as the need of diversion ileostomy, this was discussed with him as well and high outputs and volume issues discussed.  He voiced understanding and agreed to proceed.  DESCRIPTION OF PROCEDURE:  The patient was brought to the operating room after being met in the holding area and questions were answered.  He was then taken back to the operating room, placed supine on the OR table. After induction of general anesthesia, he was placed in lithotomy and a Foley catheter was placed and his abdomen was prepped and draped in a sterile fashion.  He was appropriately padded and cushioned with his right arm tucked.  A time-out was done.  He was already on preoperative antibiotics.  A 5 mm Optiview port was placed in the right mid abdomen. We entered the abdominal cavity without difficulty and insufflation to 15 mmHg of CO2 was done.  Laparoscope was in place.  Laparoscopy showed an inflamed sigmoid colon that was densely adherent to the left pelvic sidewall.  I placed 2 other 5 mm ports, one in the epigastrium and a second in the right lower quadrant.  We used Harmonic scalpel to begin to mobilize the white line of Toldt.  The sigmoid colon was densely adherent to the pelvic side wall and laparoscopically, I could not dissect this  away safely.  I elected to place a 7 cm incision in the lower abdomen for a GelPort, which I did under direct open guidance.  I was then able to place my hand and began to mobilize the sigmoid colon off the lateral side wall, it was densely inflamed and adherent.  It was felt he had a very tight stricture there as well.  I was able to do this with my hand and a Harmonic scalpel to mobilize this.  I then pulled this out the lower incision and placed a Balfour retractor.  This area was severely inflamed, and there was abscess in the mesentery as well. I resected all this area using a GIA-75  stapling device.  I then used a second load distal to the stricture at the rectosigmoid junction.  This area was then removed and the mesentery was taken just adjacent to the bowel to prevent any injury to adjacent structure due to the significant inflammatory nature of this process.  The area was passed off the field. I then examined the retroperitoneum and the ureter was identified and had been preserved.  Right ureter was preserved as well.  Iliac vessels and gonadal vessels were preserved as well.  This was then irrigated out.  Hemostasis was achieved.  Both ends laid nicely, were very healthy in appearance.  I felt that a primary anastomosis hand-sewn and then proximal diversion would facilitate his care down the road.  Hand-sewn anastomosis was created using 2-0 Vicryl in a single layer.  There was no tension at the anastomosis.  I placed a drain in the pelvis next to this.  This was placed back in the pelvis.  I then chose a site in the right lower quadrant to create a loop ileostomy.  I found a piece of ileum that was approximately 20-25 cm from the ileocecal valve.  A circular incision was made in the right lower quadrant and the fat was removed down to the abdominal wall.  This was opened in a vertical fashion, and I passed my fingers to 2 fingerbreadths through this.  A Kary Kos was then passed through this area that was chosen for the ileostomy, was brought out easily with no tension.  It was not twisted. It was held in place with 2 Babcock's.  The wound was irrigated.  It was packed open with Kerlix.  The drain was placed to bulb suction.  A bridge was then fashioned underneath the loop ileostomy and secured to the skin with 2-0 nylon.  The ileostomy was matured with 2-0 Vicryl. Drain placed to bulb suction.  The laparoscopic port sites were closed with single staples.  All final counts of sponge, needle, instruments were found to be correct at this portion of the case.  The  patient was then awakened, extubated, taken to recovery in satisfactory condition.     Secilia Apps A. Wreatha Sturgeon, M.D.     TAC/MEDQ  D:  08/26/2014  T:  08/27/2014  Job:  [id number]

## 2014-08-27 NOTE — Consult Note (Addendum)
WOC ostomy consult note Stoma type/location:  Consult requested for ileostomy from 8/10 surgery.  CCS team following for assessment and plan of care to abd wound. Stomal assessment/size: Stoma red and viable when visualized through pouch which is intact with good seal. Output:  No stool or flatus at this time.  Ostomy pouching: 2pc.  Education provided: Demonstrated pouch appearance and briefly described pouching routines. Will plan to perform pouch change tomorrow when pt is not in as much pain. Extra supplies left at bedside. Enrolled patient in [facility] DC program: No Cammie Mcgee MSN, RN, [place], [place], [place] [phone]

## 2014-08-27 NOTE — Progress Notes (Signed)
[facility] Progress Note  1 Day Post-Op  Subjective: Pt is maxing out on his PCA and asking for IVP dilaudid every hour.  No N/V.  Tolerating ice chips.  Worried about having to urinate and getting up out of bed.  Foley discontinued.  Having flatus in bag, sanguinous drainage but no stool  Objective: Vital signs in last 24 hours: Temp:  [97.3 F (36.3 C)-98.9 F (37.2 C)] 98 F (36.7 C) (08/11 0454) Pulse Rate:  [63-93] 93 (08/11 0454) Resp:  [13-23] 19 (08/11 0454) BP: (118-177)/(77-101) 168/95 mmHg (08/11 0454) SpO2:  [95 %-100 %] 95 % (08/11 0454) Last BM Date: 08/25/14  Intake/Output from previous day: 08/10 0701 - 08/11 0700 In: 2400 [I.V.:2400] Out: 3850 [Urine:3475; Drains:100; Stool:75; Blood:200] Intake/Output this shift:    PE: Gen:  Alert, NAD, pleasant Card:  RRR, no M/G/R heard Pulm:  CTA, no W/R/R, low effort Abd: Soft, distended, diffusely tender, +BS, no HSM, mildline wound with sanguinous drainage, bulb drain in place with sanguinous drainage, ileostomy pink and patent with sanguinous drainage and flatus in ostomy bag.  Lab Results:   Recent Labs  08/24/14 1000  WBC 9.6  HGB 12.2*  HCT 36.5*  PLT 207   BMET  Recent Labs  08/24/14 1000  NA 136  K 4.0  CL 101  CO2 25  GLUCOSE 92  BUN 9  CREATININE 1.31*  CALCIUM 8.3*   PT/INR No results for input(s): LABPROT, INR in the last 72 hours. CMP     Component Value Date/Time   NA 136 08/24/2014 1000   K 4.0 08/24/2014 1000   CL 101 08/24/2014 1000   CO2 25 08/24/2014 1000   GLUCOSE 92 08/24/2014 1000   BUN 9 08/24/2014 1000   CREATININE 1.31* 08/24/2014 1000   CALCIUM 8.3* 08/24/2014 1000   PROT 8.3* 08/21/2014 1008   ALBUMIN 3.6 08/21/2014 1008   AST 18 08/21/2014 1008   ALT 24 08/21/2014 1008   ALKPHOS 56 08/21/2014 1008   BILITOT 1.0 08/21/2014 1008   GFRNONAA >60 08/24/2014 1000   GFRAA >60 08/24/2014 1000   Lipase     Component Value Date/Time   LIPASE 27  08/21/2014 1008       Studies/Results: No results found.  Anti-infectives: Anti-infectives    Start     Dose/Rate Route Frequency Ordered Stop   08/26/14 0600  cefoTEtan (CEFOTAN) 2 g in dextrose 5 % 50 mL IVPB  Status:  Discontinued     2 g 100 mL/hr over 30 Minutes Intravenous On call to O.R. 08/25/14 1935 08/25/14 2003   08/26/14 0600  cefoTEtan (CEFOTAN) 2 g in dextrose 5 % 50 mL IVPB     2 g 100 mL/hr over 30 Minutes Intravenous To ShortStay Surgical 08/25/14 2003 08/26/14 1235   08/21/14 1630  piperacillin-tazobactam (ZOSYN) IVPB 3.375 g     3.375 g 12.5 mL/hr over 240 Minutes Intravenous Every 8 hours 08/21/14 1553     08/21/14 1445  piperacillin-tazobactam (ZOSYN) IVPB 3.375 g  Status:  Discontinued     3.375 g 12.5 mL/hr over 240 Minutes Intravenous 3 times per day 08/21/14 1432 08/21/14 1552       Assessment/Plan Recurrent diverticulitis with perforation and free air, failed medical management x 2 POD #1 s/p sigmoid colectomy with hand-sewn primary anastomosis and diversion loop ileostomy AKI - Cr. 1.31 08/24/14 H/o appendectomy  Plan: 1. IVF, pain control, antiemetics.  PCA, add robaxin, IV tylenol, and ice. 2.  Day #6  Zosyn, likely needs until 7 days after surgery, ordered to stop on 17th at 2359 3. SCD's and lovenox 4. Ambulate and IS 5. Recheck labs today 6. Awaiting path report 7.  Await improvement in bowel function prior to advancing diet. 8.  Start dressing changes WD q12hr 9.  WOC nursing for ostomy teaching    LOS: 5 days    Nonie Hoyer 08/27/2014, 7:29 AM Pager: [phone]

## 2014-08-28 LAB — CBC
HEMATOCRIT: 35.9 % — AB (ref 39.0–52.0)
HEMOGLOBIN: 11.7 g/dL — AB (ref 13.0–17.0)
MCH: 27 pg (ref 26.0–34.0)
MCHC: 32.6 g/dL (ref 30.0–36.0)
MCV: 82.7 fL (ref 78.0–100.0)
Platelets: 259 10*3/uL (ref 150–400)
RBC: 4.34 MIL/uL (ref 4.22–5.81)
RDW: 13.9 % (ref 11.5–15.5)
WBC: 11.9 10*3/uL — AB (ref 4.0–10.5)

## 2014-08-28 LAB — BASIC METABOLIC PANEL
Anion gap: 8 (ref 5–15)
BUN: 7 mg/dL (ref 6–20)
CO2: 27 mmol/L (ref 22–32)
Calcium: 8.2 mg/dL — ABNORMAL LOW (ref 8.9–10.3)
Chloride: 101 mmol/L (ref 101–111)
Creatinine, Ser: 1.35 mg/dL — ABNORMAL HIGH (ref 0.61–1.24)
GFR calc Af Amer: >60 mL/min (ref >60)
GFR calc non Af Amer: >60 mL/min (ref >60)
Glucose, Bld: 86 mg/dL (ref 65–99)
POTASSIUM: 4.8 mmol/L (ref 3.5–5.1)
SODIUM: 136 mmol/L (ref 135–145)

## 2014-08-28 MED ORDER — ACETAMINOPHEN 10 MG/ML IV SOLN
1000.0000 mg | Freq: Four times a day (QID) | INTRAVENOUS | Status: AC
Start: 2014-08-28 — End: 2014-08-29
  Administered 2014-08-28 – 2014-08-29 (×4): 1000 mg via INTRAVENOUS
  Filled 2014-08-28 (×4): qty 100

## 2014-08-28 MED ORDER — BOOST / RESOURCE BREEZE PO LIQD
1.0000 | Freq: Three times a day (TID) | ORAL | Status: DC
  Administered 2014-08-28 – 2014-08-31 (×8): 1 via ORAL

## 2014-08-28 MED ORDER — OXYCODONE HCL 5 MG PO TABS
5.0000 mg | ORAL_TABLET | ORAL | Status: DC | PRN
  Administered 2014-08-29 – 2014-08-31 (×9): 15 mg via ORAL
  Filled 2014-08-28 (×9): qty 3

## 2014-08-28 NOTE — Progress Notes (Signed)
Nutrition Follow-up  DOCUMENTATION CODES:   Obesity unspecified  INTERVENTION:   -Boost Breeze po TID, each supplement provides 250 kcal and 9 grams of protein -If prolonged NPO/clear liquid status is expected, consider initiation of nutrition support   NUTRITION DIAGNOSIS:   Inadequate oral intake related to inability to eat as evidenced by NPO status.  Ongoing  GOAL:   Patient will meet greater than or equal to 90% of their needs  Unmet  MONITOR:   PO intake, Diet advancement, Labs, Weight trends, Skin, I & O's  REASON FOR ASSESSMENT:   Malnutrition Screening Tool    ASSESSMENT:   44 y/o obese white male presented to [facility] with recurrent LLQ abdominal pain. He was recently admitted and placed on IV Rocephin and Flagyl for diverticulitis with microperforation and for a UTI. He was treated with medical management and did not require surgical interventions. His diet was slowly advanced and discharged home on 08/03/14 with 1 week of antibiotics. He was doing well at home for 2 weeks. Wednesday he saw his PCP and was still feeling okay, was found to have a benign exam and blood in urine. He says since Wednesday afternoon the pain has significantly worsened and he has had chills, severe LLQ abdominal pain 9/10.  S/p Procedure(s) on 08/26/14: LAPAROSCOPIC ASSISTED PARTIAL COLECTOMY WITH POSSIBLE OSTOMY (N/A)  Reviewed [facility] note on 08/27/14. [facility]RN providing education for ostomy care. Pt also with abdominal wound; per surgery notes, pt may need wound vac.   RUQ ileostomy functioning well per notes; noted 350 ml output this shift.   Per MD notes, pain control is a large barrier.   Pt just advanced to clear liquid diet this morning. Will add Boost Breeze to maximize nutritional intake.   Diet Order:  Diet clear liquid Room service appropriate?: Yes; Fluid consistency:: Thin  Skin:  Wound (see comment) (closed abdominal incision)  Last BM:  08/28/14  Height:   Ht  Readings from Last 1 Encounters:  08/21/14  (1.778 m)    Weight:   Wt Readings from Last 1 Encounters:  08/21/14 270 lb (122.471 kg)    Ideal Body Weight:  75.5 kg  BMI:  Body mass index is 38.74 kg/(m^2).  Estimated Nutritional Needs:   Kcal:  2100-2300  Protein:  95-120 grams  Fluid:  2.2-2.3 L  EDUCATION NEEDS:   No education needs identified at this time  Livia Tarr A. Mayford Knife, RD, LDN, CDE Pager: [phone] After hours Pager: [phone]

## 2014-08-28 NOTE — Progress Notes (Signed)
[facility] Progress Note  2 Days Post-Op  Subjective: Pt doing okay.  Still has pain, minimally better today.  Has PCA.  No N/V, thirsty.  +flatus and liquid green stool output.  Ostomy teaching ongoing.  IS up to 2000.  Objective: Vital signs in last 24 hours: Temp:  [98.1 F (36.7 C)-98.4 F (36.9 C)] 98.4 F (36.9 C) (08/12 0517) Pulse Rate:  [67-86] 79 (08/12 0517) Resp:  [16-20] 20 (08/12 0517) BP: (118-146)/(79-92) 125/79 mmHg (08/12 0517) SpO2:  [91 %-98 %] 96 % (08/12 0517) Last BM Date:  (pta)  Intake/Output from previous day: 08/11 0701 - 08/12 0700 In: -  Out: 1525 [Drains:40; Stool:100] Intake/Output this shift:    PE: Gen:  Alert, NAD, pleasant Card:  RRR, no M/G/R heard Pulm:  CTA, no W/R/R Abd: Soft, distended, diffusely tender, +BS, no HSM, mildline wound with sanguinous drainage, bulb drain in place with sero-sanguinous drainage, ileostomy pink and patent with greenish liquid stool and flatus in ostomy bag.   Lab Results:   Recent Labs  08/27/14 0842 08/28/14 0418  WBC 11.9* 11.9*  HGB 12.6* 11.7*  HCT 37.3* 35.9*  PLT 249 259   BMET  Recent Labs  08/27/14 0842 08/28/14 0418  NA 137 136  K 4.0 4.8  CL 101 101  CO2 29 27  GLUCOSE 98 86  BUN <5* 7  CREATININE 1.35* 1.35*  CALCIUM 8.4* 8.2*   PT/INR No results for input(s): LABPROT, INR in the last 72 hours. CMP     Component Value Date/Time   NA 136 08/28/2014 0418   K 4.8 08/28/2014 0418   CL 101 08/28/2014 0418   CO2 27 08/28/2014 0418   GLUCOSE 86 08/28/2014 0418   BUN 7 08/28/2014 0418   CREATININE 1.35* 08/28/2014 0418   CALCIUM 8.2* 08/28/2014 0418   PROT 8.3* 08/21/2014 1008   ALBUMIN 3.6 08/21/2014 1008   AST 18 08/21/2014 1008   ALT 24 08/21/2014 1008   ALKPHOS 56 08/21/2014 1008   BILITOT 1.0 08/21/2014 1008   GFRNONAA >60 08/28/2014 0418   GFRAA >60 08/28/2014 0418   Lipase     Component Value Date/Time   LIPASE 27 08/21/2014 1008        Studies/Results: No results found.  Anti-infectives: Anti-infectives    Start     Dose/Rate Route Frequency Ordered Stop   08/26/14 0600  cefoTEtan (CEFOTAN) 2 g in dextrose 5 % 50 mL IVPB  Status:  Discontinued     2 g 100 mL/hr over 30 Minutes Intravenous On call to O.R. 08/25/14 1935 08/25/14 2003   08/26/14 0600  cefoTEtan (CEFOTAN) 2 g in dextrose 5 % 50 mL IVPB     2 g 100 mL/hr over 30 Minutes Intravenous To ShortStay Surgical 08/25/14 2003 08/26/14 1235   08/21/14 1630  piperacillin-tazobactam (ZOSYN) IVPB 3.375 g     3.375 g 12.5 mL/hr over 240 Minutes Intravenous Every 8 hours 08/21/14 1553 09/02/14 2359   08/21/14 1445  piperacillin-tazobactam (ZOSYN) IVPB 3.375 g  Status:  Discontinued     3.375 g 12.5 mL/hr over 240 Minutes Intravenous 3 times per day 08/21/14 1432 08/21/14 1552       Assessment/Plan Recurrent diverticulitis with perforation and free air, failed medical management x 2 POD #2 s/p sigmoid colectomy with hand-sewn primary anastomosis and diversion loop ileostomy AKI - Cr. 1.35 08/28/14 H/o appendectomy  Plan: 1. IVF, pain control, antiemetics. PCA, add robaxin, IV tylenol, and ice.  D/c PCA  tomorrow 0800 and start orals and IVP dilaudid. 2. Day #7 Zosyn, likely needs until 7 days after surgery, ordered to stop on 17th at 2359 3. SCD's and lovenox 4. Ambulate and IS 5. Recheck labs tomorrow 6. Awaiting path report 7. Start dressing changes WD q12hr 8. WOC nursing for ostomy teaching, would likely be a good candidate for wound vac soon    LOS: 6 days    Nonie Hoyer 08/28/2014, 8:38 AM Pager: [phone]

## 2014-08-28 NOTE — Consult Note (Addendum)
WOC ostomy follow up Stoma type/location: Ileostomy in place to RLQ from surgery on 8/11 Stomal assessment/size: Stoma red and viable, just below 2 inches, flush with skin level, red rubber rod in place through stoma Peristomal assessment: Some blisters have ruptured and evolved into partial thickness skin loss from tape surrounding the ostomy pouch,  They are not from the barrier or tape on the pouch. Output: Large amt liquid green stool  Ostomy pouching: 2pc. with barrier ring Education provided:  Demonstrated pouch change to patient and his mother.  Both are able to open and close the velcro to empty.  Applied 2 piece with barrier ring to maintain seal.  Opening cut to the last possible ring on the barrier R/T the stoma rod. Discussed pouch change routines and supplies left at bedside for staff nurse use.  Plan to follow on Mon for another teaching session. Pt and mother deny further questions at this time. Enrolled patient in [place] Secure Start Discharge program: No Cammie Mcgee MSN, RN, [place], [place] [phone]

## 2014-08-29 LAB — BASIC METABOLIC PANEL
ANION GAP: 5 (ref 5–15)
BUN: 6 mg/dL (ref 6–20)
CALCIUM: 8.3 mg/dL — AB (ref 8.9–10.3)
CO2: 27 mmol/L (ref 22–32)
Chloride: 105 mmol/L (ref 101–111)
Creatinine, Ser: 1.14 mg/dL (ref 0.61–1.24)
GFR calc Af Amer: >60 mL/min (ref >60)
GFR calc non Af Amer: >60 mL/min (ref >60)
Glucose, Bld: 97 mg/dL (ref 65–99)
POTASSIUM: 3.9 mmol/L (ref 3.5–5.1)
SODIUM: 137 mmol/L (ref 135–145)

## 2014-08-29 LAB — CBC
HCT: 35.1 % — ABNORMAL LOW (ref 39.0–52.0)
Hemoglobin: 11.5 g/dL — ABNORMAL LOW (ref 13.0–17.0)
MCH: 27.2 pg (ref 26.0–34.0)
MCHC: 32.8 g/dL (ref 30.0–36.0)
MCV: 83 fL (ref 78.0–100.0)
Platelets: 258 10*3/uL (ref 150–400)
RBC: 4.23 MIL/uL (ref 4.22–5.81)
RDW: 14.2 % (ref 11.5–15.5)
WBC: 8.7 10*3/uL (ref 4.0–10.5)

## 2014-08-29 NOTE — Progress Notes (Signed)
Patient ID: Bryan [name], male   DOB: October 28, 1970, 44 y.o.   MRN: [id number] 3 Days Post-Op  Subjective: Didn't sleep much due to PCA going off.  No nausea.  Not eating much liquids.  Doesn't want broth.  Objective: Vital signs in last 24 hours: Temp:  [97.8 F (36.6 C)-98.1 F (36.7 C)] 98.1 F (36.7 C) (08/13 0930) Pulse Rate:  [71-88] 71 (08/13 0930) Resp:  [11-22] 15 (08/13 0930) BP: (117-146)/(63-87) 146/87 mmHg (08/13 0930) SpO2:  [96 %-100 %] 100 % (08/13 0930) Last BM Date:  (pta)  Intake/Output from previous day: 08/12 0701 - 08/13 0700 In: 1860 [P.O.:1560; IV Piggyback:300] Out: 4865 [Urine:3825; Drains:40; Stool:1000] Intake/Output this shift: Total I/O In: -  Out: 900 [Urine:700; Stool:200]  PE: Abd: soft, appropriately tender, +BS, ileostomy with good output, midline wound clean and packed, JP drain with serosang output  Lab Results:   Recent Labs  08/28/14 0418 08/29/14 0408  WBC 11.9* 8.7  HGB 11.7* 11.5*  HCT 35.9* 35.1*  PLT 259 258   BMET  Recent Labs  08/28/14 0418 08/29/14 0408  NA 136 137  K 4.8 3.9  CL 101 105  CO2 27 27  GLUCOSE 86 97  BUN 7 6  CREATININE 1.35* 1.14  CALCIUM 8.2* 8.3*   PT/INR No results for input(s): LABPROT, INR in the last 72 hours. CMP     Component Value Date/Time   NA 137 08/29/2014 0408   K 3.9 08/29/2014 0408   CL 105 08/29/2014 0408   CO2 27 08/29/2014 0408   GLUCOSE 97 08/29/2014 0408   BUN 6 08/29/2014 0408   CREATININE 1.14 08/29/2014 0408   CALCIUM 8.3* 08/29/2014 0408   PROT 8.3* 08/21/2014 1008   ALBUMIN 3.6 08/21/2014 1008   AST 18 08/21/2014 1008   ALT 24 08/21/2014 1008   ALKPHOS 56 08/21/2014 1008   BILITOT 1.0 08/21/2014 1008   GFRNONAA >60 08/29/2014 0408   GFRAA >60 08/29/2014 0408   Lipase     Component Value Date/Time   LIPASE 27 08/21/2014 1008       Studies/Results: No results found.  Anti-infectives: Anti-infectives    Start     Dose/Rate Route Frequency  Ordered Stop   08/26/14 0600  cefoTEtan (CEFOTAN) 2 g in dextrose 5 % 50 mL IVPB  Status:  Discontinued     2 g 100 mL/hr over 30 Minutes Intravenous On call to O.R. 08/25/14 1935 08/25/14 2003   08/26/14 0600  cefoTEtan (CEFOTAN) 2 g in dextrose 5 % 50 mL IVPB     2 g 100 mL/hr over 30 Minutes Intravenous To ShortStay Surgical 08/25/14 2003 08/26/14 1235   08/21/14 1630  piperacillin-tazobactam (ZOSYN) IVPB 3.375 g     3.375 g 12.5 mL/hr over 240 Minutes Intravenous Every 8 hours 08/21/14 1553 09/02/14 2359   08/21/14 1445  piperacillin-tazobactam (ZOSYN) IVPB 3.375 g  Status:  Discontinued     3.375 g 12.5 mL/hr over 240 Minutes Intravenous 3 times per day 08/21/14 1432 08/21/14 1552       Assessment/Plan POD #3 s/p sigmoid colectomy with hand-sewn primary anastomosis and diverting loop ileostomy -advance to full liquids -DC PCA and start oral pain regimen -mobilize and pulm toilet  AKI  -resolved.  Cr 1.14 DVT prophylaxis SCDs/Lovenox (60)  LOS: 7 days    Sean Macwilliams E 08/29/2014, 10:13 AM Pager: [phone]

## 2014-08-30 MED ORDER — HYDROMORPHONE HCL 1 MG/ML IJ SOLN: 1.0000 mg | Freq: Once | INTRAMUSCULAR | Status: AC

## 2014-08-30 MED ORDER — METHOCARBAMOL 500 MG PO TABS
1000.0000 mg | ORAL_TABLET | Freq: Three times a day (TID) | ORAL | Status: DC | PRN
  Administered 2014-08-30: 1000 mg via ORAL
  Filled 2014-08-30: qty 2

## 2014-08-30 MED ORDER — HYDROMORPHONE HCL 1 MG/ML IJ SOLN
INTRAMUSCULAR | Status: AC
  Filled 2014-08-30: qty 1

## 2014-08-30 MED ORDER — LIDOCAINE HCL (CARDIAC) 20 MG/ML IV SOLN
INTRAVENOUS | Status: AC
  Filled 2014-08-30: qty 5

## 2014-08-30 MED ORDER — LIDOCAINE HCL (PF) 2 % IJ SOLN
10.0000 mL | Freq: Once | INTRAMUSCULAR | Status: AC
  Administered 2014-08-30: 10 mL via INTRADERMAL
  Filled 2014-08-30: qty 10

## 2014-08-30 NOTE — Progress Notes (Signed)
Patient ID: Bryan [name], male   DOB: 1970/12/25, 44 y.o.   MRN: [id number] 4 Days Post-Op  Subjective: Pt feels ok.  Has trouble mobilizing.  Tolerating full liquids, moving stuff out ileostomy.    Objective: Vital signs in last 24 hours: Temp:  [98.3 F (36.8 C)-99.4 F (37.4 C)] 98.4 F (36.9 C) (08/14 0537) Pulse Rate:  [69-82] 69 (08/14 0537) Resp:  [17-18] 17 (08/14 0537) BP: (132-140)/(76-86) 132/86 mmHg (08/14 0537) SpO2:  [96 %-98 %] 98 % (08/14 0537) Last BM Date: 08/29/14  Intake/Output from previous day: 08/13 0701 - 08/14 0700 In: 3456.4 [P.O.:720; I.V.:2736.4] Out: 5105 [Urine:3450; Drains:30; Stool:1625] Intake/Output this shift: Total I/O In: 360 [P.O.:360] Out: 750 [Urine:600; Stool:150]  PE: Abd: soft, appropriately tender, +BS, ileostomy working with good output, midline wound clean and packed.  JP drain serous  Lab Results:   Recent Labs  08/28/14 0418 08/29/14 0408  WBC 11.9* 8.7  HGB 11.7* 11.5*  HCT 35.9* 35.1*  PLT 259 258   BMET  Recent Labs  08/28/14 0418 08/29/14 0408  NA 136 137  K 4.8 3.9  CL 101 105  CO2 27 27  GLUCOSE 86 97  BUN 7 6  CREATININE 1.35* 1.14  CALCIUM 8.2* 8.3*   PT/INR No results for input(s): LABPROT, INR in the last 72 hours. CMP     Component Value Date/Time   NA 137 08/29/2014 0408   K 3.9 08/29/2014 0408   CL 105 08/29/2014 0408   CO2 27 08/29/2014 0408   GLUCOSE 97 08/29/2014 0408   BUN 6 08/29/2014 0408   CREATININE 1.14 08/29/2014 0408   CALCIUM 8.3* 08/29/2014 0408   PROT 8.3* 08/21/2014 1008   ALBUMIN 3.6 08/21/2014 1008   AST 18 08/21/2014 1008   ALT 24 08/21/2014 1008   ALKPHOS 56 08/21/2014 1008   BILITOT 1.0 08/21/2014 1008   GFRNONAA >60 08/29/2014 0408   GFRAA >60 08/29/2014 0408   Lipase     Component Value Date/Time   LIPASE 27 08/21/2014 1008       Studies/Results: No results found.  Anti-infectives: Anti-infectives    Start     Dose/Rate Route Frequency  Ordered Stop   08/26/14 0600  cefoTEtan (CEFOTAN) 2 g in dextrose 5 % 50 mL IVPB  Status:  Discontinued     2 g 100 mL/hr over 30 Minutes Intravenous On call to O.R. 08/25/14 1935 08/25/14 2003   08/26/14 0600  cefoTEtan (CEFOTAN) 2 g in dextrose 5 % 50 mL IVPB     2 g 100 mL/hr over 30 Minutes Intravenous To ShortStay Surgical 08/25/14 2003 08/26/14 1235   08/21/14 1630  piperacillin-tazobactam (ZOSYN) IVPB 3.375 g     3.375 g 12.5 mL/hr over 240 Minutes Intravenous Every 8 hours 08/21/14 1553 09/02/14 2359   08/21/14 1445  piperacillin-tazobactam (ZOSYN) IVPB 3.375 g  Status:  Discontinued     3.375 g 12.5 mL/hr over 240 Minutes Intravenous 3 times per day 08/21/14 1432 08/21/14 1552       Assessment/Plan  POD #4 s/p sigmoid colectomy with hand-sewn primary anastomosis and diverting loop ileostomy -advance to regular diet -oral pain regimen -mobilize and pulm toilet -patient needs assist still to mobilize.  Will have PT see him for recs for home -[facility] set up for RN for ostomy and wound -add gatorade -SLIV and make sure he can maintain his hydration  AKI  -resolved. Cr 1.14 DVT prophylaxis SCDs/Lovenox (60)   LOS: 8 days  Nehal Witting E 08/30/2014, 9:58 AM Pager: [phone]

## 2014-08-30 NOTE — Progress Notes (Signed)
Patient ID: Bryan [name], male   DOB: 07-06-1970, 44 y.o.   MRN: [id number] CTS patient by RN who noted bleeding from his wound despite holding pressure. Local injected and small sub cut arteriole L lower part sutured with several 3-0 vicryl. Hemostasis achieved and a new dressing was placed. Violeta Gelinas, MD, MPH, FACS Trauma: [phone] General Surgery: [phone]

## 2014-08-31 MED ORDER — OXYCODONE HCL 5 MG PO TABS: 5.0000 mg | ORAL_TABLET | Freq: Four times a day (QID) | ORAL | Status: DC | PRN

## 2014-08-31 MED ORDER — METHOCARBAMOL 500 MG PO TABS: 1000.0000 mg | ORAL_TABLET | Freq: Three times a day (TID) | ORAL | Status: DC | PRN

## 2014-08-31 MED ORDER — ACETAMINOPHEN 325 MG PO TABS: 650.0000 mg | ORAL_TABLET | Freq: Four times a day (QID) | ORAL | Status: AC | PRN

## 2014-08-31 MED ORDER — AMOXICILLIN-POT CLAVULANATE 875-125 MG PO TABS: 1.0000 | ORAL_TABLET | Freq: Two times a day (BID) | ORAL | Status: DC

## 2014-08-31 NOTE — Progress Notes (Signed)
[facility] Progress Note  5 Days Post-Op  Subjective: Pt doing well, tolerating regular diet.  Ambulating well, but hard to get out of bed.  No N/V.  Wound not bleeding any further.  Stool thickening up now.  Good flatus.  Able to independently sit on the side of the bed to eat meals.    Objective: Vital signs in last 24 hours: Temp:  [98.1 F (36.7 C)-98.2 F (36.8 C)] 98.2 F (36.8 C) (08/15 0631) Pulse Rate:  [69-76] 74 (08/15 0631) Resp:  [18] 18 (08/15 0631) BP: (132-142)/(83-86) 142/83 mmHg (08/15 0631) SpO2:  [97 %-99 %] 97 % (08/15 0631) Last BM Date: 08/30/14  Intake/Output from previous day: 08/14 0701 - 08/15 0700 In: 840 [P.O.:840] Out: 3725 [Urine:3325; Drains:20; Stool:380] Intake/Output this shift:    PE: Gen:  Alert, NAD, pleasant Abd: Soft, mildly tender, ND, +BS, no HSM, midline wound clean, no further active drainage, ostomy pink, with soft brown stools, serosanguinous drainage (20mL) out JP   Lab Results:   Recent Labs  08/29/14 0408  WBC 8.7  HGB 11.5*  HCT 35.1*  PLT 258   BMET  Recent Labs  08/29/14 0408  NA 137  K 3.9  CL 105  CO2 27  GLUCOSE 97  BUN 6  CREATININE 1.14  CALCIUM 8.3*   PT/INR No results for input(s): LABPROT, INR in the last 72 hours. CMP     Component Value Date/Time   NA 137 08/29/2014 0408   K 3.9 08/29/2014 0408   CL 105 08/29/2014 0408   CO2 27 08/29/2014 0408   GLUCOSE 97 08/29/2014 0408   BUN 6 08/29/2014 0408   CREATININE 1.14 08/29/2014 0408   CALCIUM 8.3* 08/29/2014 0408   PROT 8.3* 08/21/2014 1008   ALBUMIN 3.6 08/21/2014 1008   AST 18 08/21/2014 1008   ALT 24 08/21/2014 1008   ALKPHOS 56 08/21/2014 1008   BILITOT 1.0 08/21/2014 1008   GFRNONAA >60 08/29/2014 0408   GFRAA >60 08/29/2014 0408   Lipase     Component Value Date/Time   LIPASE 27 08/21/2014 1008       Studies/Results: No results found.  Anti-infectives: Anti-infectives    Start     Dose/Rate Route  Frequency Ordered Stop   08/26/14 0600  cefoTEtan (CEFOTAN) 2 g in dextrose 5 % 50 mL IVPB  Status:  Discontinued     2 g 100 mL/hr over 30 Minutes Intravenous On call to O.R. 08/25/14 1935 08/25/14 2003   08/26/14 0600  cefoTEtan (CEFOTAN) 2 g in dextrose 5 % 50 mL IVPB     2 g 100 mL/hr over 30 Minutes Intravenous To ShortStay Surgical 08/25/14 2003 08/26/14 1235   08/21/14 1630  piperacillin-tazobactam (ZOSYN) IVPB 3.375 g     3.375 g 12.5 mL/hr over 240 Minutes Intravenous Every 8 hours 08/21/14 1553 09/02/14 2359   08/21/14 1445  piperacillin-tazobactam (ZOSYN) IVPB 3.375 g  Status:  Discontinued     3.375 g 12.5 mL/hr over 240 Minutes Intravenous 3 times per day 08/21/14 1432 08/21/14 1552       Assessment/Plan POD #5 s/p sigmoid colectomy with hand-sewn primary anastomosis and diverting loop ileostomy -Ordered wound vac, if able to get it here prior to discharge can put wound vac on now, otherwise may need to go home with WD and have wound vac placed tomorrow at home. -Tolerating regular diet -oral pain regimen -mobilize and pulm toilet -patient needs assist still to mobilize. PT pending. -HH set  up for RN for ostomy and wound vac -Add gatorade, ostomy output less -SLIV -Should be able to d/c drain today - serosang with 20mL output -Should be able to d/c home today.  AKI  -resolved. Cr 1.14 DVT prophylaxis SCDs/Lovenox (60)     LOS: 9 days    Nonie Hoyer 08/31/2014, 8:03 AM Pager: [phone]

## 2014-08-31 NOTE — Consult Note (Signed)
WOC ostomy follow up [facility] following for assessment and plan of care for abd wound.  Pt plans to discharge with moist gauze packing to abd wound and home health will apply negative pressure dressing when machine is available after discharge according to EMR. Stoma type/location: Ileostomy in place to RLQ from surgery on 8/11 Stomal assessment/size: Stoma red and viable, just below 2 inches, flush with skin level, red rubber rod in place through stoma Peristomal assessment: Some blisters have ruptured and evolved into partial thickness skin loss from tape surrounding the ostomy pouch; previous areas are pink and dry and no further new locations are noted underneath ostomy wafer. Output: Large amt liquid green stool  Ostomy pouching: 2pc. with barrier ring Education provided: Demonstrated pouch change to patient.He is able to open and close the velcro to empty. Applied 2 piece with barrier ring to maintain seal. Opening cut to the last possible ring on the barrier R/T the stoma rod. Discussed pouch change routines, ordering supplies, and dietary precautions and supplies ordered for discharge home. Pt denies further questions at this time. Enrolled patient in [place] Secure Start Discharge program: Yes Cammie Mcgee MSN, RN, [place], [place], [place] [phone]

## 2014-08-31 NOTE — Evaluation (Signed)
Physical Therapy Evaluation/Discharge Patient Details Name: Bryan [name] MRN: [id number] DOB: 02-11-1970 Today's Date: 08/31/2014   History of Present Illness  Pt is a 44 y/o M who presented w/ diverticulitis and now is s/p sigmoid colectomy.  Pt's PMH includes appendicitis and thigh hematoma.  Clinical Impression  Pt admitted with above diagnosis. Bryan [name] demonstrated mod Ind level w/ all mobility and does not require further PT at this time.  He demonstrated ability to ambulate in hallway using RW and w/o AD. Has RW at home that he can use prn. PT signing off.      Follow Up Recommendations No PT follow up    Equipment Recommendations  None recommended by PT (has RW at home)    Recommendations for Other Services       Precautions / Restrictions Precautions Precaution Comments: educated pt on and pt demonstrated log roll technique to dec pain w/ bed mobility Restrictions Weight Bearing Restrictions: No      Mobility  Bed Mobility Overal bed mobility: Modified Independent             General bed mobility comments: Mod I w/ increased time.  Pt demonstrated log roll technique w/ VCs provided.  Transfers Overall transfer level: Modified independent Equipment used: Rolling walker (2 wheeled)             General transfer comment: Mod I w/ use of RW and increased time.  Pt demonstrated safe technique and did not require any assist.  Ambulation/Gait Ambulation/Gait assistance: Modified independent (Device/Increase time) Ambulation Distance (Feet): 400 Feet Assistive device: 4-wheeled walker;None Gait Pattern/deviations: Step-through pattern;Decreased stride length;Wide base of support   Gait velocity interpretation: Below normal speed for age/gender General Gait Details: Dec trunk rotation B 2/2 guarding of abdomen.  Pt stable w/ and w/o RW; however, use of RW dec abdominal pain per pt.  DGI completed w/o use of RW.  Stairs            Wheelchair  Mobility    Modified Rankin (Stroke Patients Only)       Balance Overall balance assessment: Modified Independent                               Standardized Balance Assessment Standardized Balance Assessment : Dynamic Gait Index   Dynamic Gait Index Level Surface: Mild Impairment (slower gait speed) Change in Gait Speed: Normal Gait with Horizontal Head Turns: Normal Gait with Vertical Head Turns: Normal Gait and Pivot Turn: Normal Step Around Obstacles: Normal       Pertinent Vitals/Pain Pain Assessment: 0-10 Pain Score: 6  Pain Location: abdomen Pain Descriptors / Indicators: Aching;Discomfort;Dull;Grimacing;Guarding Pain Intervention(s): Limited activity within patient's tolerance;Monitored during session;Repositioned    Home Living Family/patient expects to be discharged to:: Private residence Living Arrangements: Parent;Other relatives (brother who is disabled) Available Help at Discharge: Family;Available PRN/intermittently (mother.  Two sisters who live nearby) Type of Home: House Home Access: Ramped entrance     Home Layout: One level Home Equipment: Walker - 2 wheels      Prior Function Level of Independence: Independent               Hand Dominance        Extremity/Trunk Assessment   Upper Extremity Assessment: Overall WFL for tasks assessed           Lower Extremity Assessment: Overall WFL for tasks assessed  Communication   Communication: No difficulties  Cognition Arousal/Alertness: Awake/alert Behavior During Therapy: WFL for tasks assessed/performed Overall Cognitive Status: Within Functional Limits for tasks assessed                      General Comments General comments (skin integrity, edema, etc.): Pt agrees that he will use RW prn at home for support if he feels unstable and prn if he has increased pain.    Exercises        Assessment/Plan    PT Assessment Patent does not need any  further PT services  PT Diagnosis Generalized weakness;Acute pain   PT Problem List    PT Treatment Interventions     PT Goals (Current goals can be found in the Care Plan section) Acute Rehab PT Goals Patient Stated Goal: to get home so he can be in his "own space", enjoy watching TV, and see his family PT Goal Formulation: All assessment and education complete, DC therapy    Frequency     Barriers to discharge        Co-evaluation               End of Session   Activity Tolerance: Patient tolerated treatment well Patient left: in chair;with call bell/phone within reach Nurse Communication: Mobility status         Time: 1610-9604 PT Time Calculation (min) (ACUTE ONLY): 16 min   Charges:   PT Evaluation $Initial PT Evaluation Tier I: 1 Procedure     PT G CodesMichail [name] PT, DPT [phone] Pager: [phone] 08/31/2014, 1:38 PM

## 2014-08-31 NOTE — Care Management Note (Signed)
Case Management Note  Patient Details  Name: DANTAVIOUS SNOWBALL MRN: [id number] Date of Birth: 11/07/1970  Subjective/Objective:   Pt for dc home today with mother.  Will need NPWT and HHRN for dressing changes.  Pt prefers [facility] for [facility] needs.                   Action/Plan: Provider agreeable to Medela NPWT; Notified [facility] of need for Medela.  Appropriate form completed and signed by MD.  Pt to be dc'd with wet to dry dressing, and HHRN will put on Medela device this afternoon at home.    Expected Discharge Date:   08/31/14               Expected Discharge Plan:  Home w Home Health Services  In-House Referral:     Discharge planning Services  CM Consult  Post Acute Care Choice:    Choice offered to:  Patient  DME Arranged:  Negative pressure wound device DME Agency:  [facility]  HH Arranged:  RN [facility] Agency:  [facility]  Status of Service:  Completed, signed off  Medicare Important Message Given:    Date Medicare IM Given:    Medicare IM give by:    Date Additional Medicare IM Given:    Additional Medicare Important Message give by:     If discussed at Long Length of Stay Meetings, dates discussed:    Additional Comments:  Quintella Baton, RN, BSN  Trauma/Neuro ICU Case Manager [phone]

## 2014-08-31 NOTE — Discharge Summary (Signed)
[facility] Discharge Summary   Patient ID: Bryan [name] MRN: [id number] DOB/AGE: 1970-09-17 44 y.o.  Admit date: 08/21/2014 Discharge date: 08/31/2014  Admitting Diagnosis: Diverticulitis of colon with perforation  Discharge Diagnosis Patient Active Problem List   Diagnosis Date Noted  . Diverticulitis of colon with perforation 08/21/2014  . Acute diverticulitis 07/30/2014  . Traumatic hematoma of thigh 03/19/2012    Consultants WOC nursing  Imaging: No results found.  Procedures Dr. Luisa Hart (08/26/14) - Lap assisted sigmoid colectomy with hand-sewn primary anastomosis and diversion loop ileostomy  Hospital Course:  44 y/o obese white male presented to [facility] with recurrent LLQ abdominal pain. He was recently admitted and placed on IV Rocephin and Flagyl for diverticulitis with microperforation and for a UTI. He was treated with medical management and did not require surgical interventions. His diet was slowly advanced and discharged home on 08/03/14 with 1 week of antibiotics. He was doing well at home for 2 weeks. Wednesday he saw his PCP and was still feeling okay, was found to have a benign exam and blood in urine. He says since Wednesday afternoon the pain has significantly worsened and he has had chills, severe LLQ abdominal pain 9/10. No N/V, no fevers, no CP/SOB. Limited flatus and no BM since Wednesday. Noticed tea colored urine. No aggravating/alleviating factors. No radiating pain. H/o appendectomy, no other surgeries or hernias.   Workup showed recurrent diverticulitis with perforation.  Patient was admitted and was transferred to the floor for conservative management.  He was given several days on IV antibiotics (Zosyn) which did not resolve his symptoms.  Eventually the decision was made to go to the OR.  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  Diet was  advanced as tolerated.  Pain meds were weaned to orals and is now well controlled.  On POD #5, the patient was voiding well, tolerating diet, ambulating well, pain well controlled, vital signs stable, incisions c/d/i and felt stable for discharge home.  He has a deep incisional wound which would benefit from a wound vac.   He had some bleeding at his inferior wound site which was controlled with suture ligation.  The bleeding has now stopped.  [facility] will arrange for the wound vac to be placed tonight so will d/c home with WD dressing for now.  Patient will follow up in our office in 2-3 weeks and knows to call with questions or concerns.  He will call the office to confirm the date and time of his follow up appointment.  He will go home with 5 more days of Augmentin to help resolve any further infection.        Medication List    STOP taking these medications        ibuprofen 200 MG tablet  Commonly known as:  ADVIL,MOTRIN     oxyCODONE-acetaminophen 5-325 MG per tablet  Commonly known as:  PERCOCET/ROXICET      TAKE these medications        acetaminophen 325 MG tablet  Commonly known as:  TYLENOL  Take 2 tablets (650 mg total) by mouth every 6 (six) hours as needed for mild pain (or temp > 100).     amoxicillin-clavulanate 875-125 MG per tablet  Commonly known as:  AUGMENTIN  Take 1 tablet by mouth every 12 (twelve) hours.     methocarbamol 500 MG tablet  Commonly known as:  ROBAXIN  Take 2 tablets (1,000  mg total) by mouth every 8 (eight) hours as needed for muscle spasms (or pain).     oxyCODONE 5 MG immediate release tablet  Commonly known as:  Oxy IR/ROXICODONE  Take 1-3 tablets (5-15 mg total) by mouth every 6 (six) hours as needed for moderate pain, severe pain or breakthrough pain.         Follow-up Information    Follow up with CORNETT,THOMAS A., MD. Call in 2 weeks.   Specialty:  General Surgery   Why:  For post-operation check in 2-3 weeks, call office to  confirm date/time   Contact information:   [address] [place] [address] [phone]       Signed: [facility]add [facility] [phone]  08/31/2014, 1:08 PM

## 2014-09-02 ENCOUNTER — Inpatient Hospital Stay (HOSPITAL_COMMUNITY)
Admission: EM | Admit: 2014-09-02 | Discharge: 2014-09-09 | DRG: 862 | Disposition: A | Discharge status: Home Health | Payer: BLUE CROSS/BLUE SHIELD | Attending: General Surgery | Admitting: General Surgery

## 2014-09-02 ENCOUNTER — Emergency Department (HOSPITAL_COMMUNITY): Payer: BLUE CROSS/BLUE SHIELD

## 2014-09-02 ENCOUNTER — Encounter (HOSPITAL_COMMUNITY): Payer: Self-pay | Admitting: Emergency Medicine

## 2014-09-02 DIAGNOSIS — Z932 Ileostomy status: Secondary | ICD-10-CM

## 2014-09-02 DIAGNOSIS — R509 Fever, unspecified: Secondary | ICD-10-CM

## 2014-09-02 DIAGNOSIS — K9189 Other postprocedural complications and disorders of digestive system: Secondary | ICD-10-CM

## 2014-09-02 DIAGNOSIS — T814XXA Infection following a procedure, initial encounter: Principal | ICD-10-CM | POA: Diagnosis present

## 2014-09-02 DIAGNOSIS — K5792 Diverticulitis of intestine, part unspecified, without perforation or abscess without bleeding: Secondary | ICD-10-CM | POA: Diagnosis present

## 2014-09-02 DIAGNOSIS — K651 Peritoneal abscess: Secondary | ICD-10-CM | POA: Diagnosis present

## 2014-09-02 DIAGNOSIS — R1030 Lower abdominal pain, unspecified: Secondary | ICD-10-CM

## 2014-09-02 LAB — COMPREHENSIVE METABOLIC PANEL
ALBUMIN: 3 g/dL — AB (ref 3.5–5.0)
ALT: 56 U/L (ref 17–63)
AST: 31 U/L (ref 15–41)
Alkaline Phosphatase: 57 U/L (ref 38–126)
Anion gap: 9 (ref 5–15)
BUN: 11 mg/dL (ref 6–20)
CHLORIDE: 98 mmol/L — AB (ref 101–111)
CO2: 28 mmol/L (ref 22–32)
CREATININE: 1.33 mg/dL — AB (ref 0.61–1.24)
Calcium: 8.9 mg/dL (ref 8.9–10.3)
GFR calc Af Amer: >60 mL/min (ref >60)
GFR calc non Af Amer: >60 mL/min (ref >60)
Glucose, Bld: 120 mg/dL — ABNORMAL HIGH (ref 65–99)
Potassium: 4 mmol/L (ref 3.5–5.1)
SODIUM: 135 mmol/L (ref 135–145)
Total Bilirubin: 1 mg/dL (ref 0.3–1.2)
Total Protein: 7.3 g/dL (ref 6.5–8.1)

## 2014-09-02 LAB — CBC WITH DIFFERENTIAL/PLATELET
BASOS ABS: 0.1 10*3/uL (ref 0.0–0.1)
BASOS PCT: 0 % (ref 0–1)
Eosinophils Absolute: 0.3 10*3/uL (ref 0.0–0.7)
Eosinophils Relative: 2 % (ref 0–5)
HEMATOCRIT: 36.5 % — AB (ref 39.0–52.0)
HEMOGLOBIN: 12.1 g/dL — AB (ref 13.0–17.0)
Lymphocytes Relative: 7 % — ABNORMAL LOW (ref 12–46)
Lymphs Abs: 1.3 10*3/uL (ref 0.7–4.0)
MCH: 27.4 pg (ref 26.0–34.0)
MCHC: 33.2 g/dL (ref 30.0–36.0)
MCV: 82.6 fL (ref 78.0–100.0)
MONOS PCT: 6 % (ref 3–12)
Monocytes Absolute: 1 10*3/uL (ref 0.1–1.0)
NEUTROS ABS: 15.1 10*3/uL — AB (ref 1.7–7.7)
NEUTROS PCT: 85 % — AB (ref 43–77)
Platelets: 366 10*3/uL (ref 150–400)
RBC: 4.42 MIL/uL (ref 4.22–5.81)
RDW: 14.1 % (ref 11.5–15.5)
WBC: 17.7 10*3/uL — AB (ref 4.0–10.5)

## 2014-09-02 LAB — URINE MICROSCOPIC-ADD ON

## 2014-09-02 LAB — URINALYSIS, ROUTINE W REFLEX MICROSCOPIC
Bilirubin Urine: NEGATIVE
GLUCOSE, UA: NEGATIVE mg/dL
Ketones, ur: NEGATIVE mg/dL
Leukocytes, UA: NEGATIVE
Nitrite: NEGATIVE
PROTEIN: 30 mg/dL — AB
Specific Gravity, Urine: 1.014 (ref 1.005–1.030)
Urobilinogen, UA: 0.2 mg/dL (ref 0.0–1.0)
pH: 5.5 (ref 5.0–8.0)

## 2014-09-02 LAB — I-STAT CG4 LACTIC ACID, ED
LACTIC ACID, VENOUS: 1.78 mmol/L (ref 0.5–2.0)
LACTIC ACID, VENOUS: 0.81 mmol/L (ref 0.5–2.0)

## 2014-09-02 MED ORDER — PANTOPRAZOLE SODIUM 40 MG IV SOLR
40.0000 mg | Freq: Every day | INTRAVENOUS | Status: DC
  Administered 2014-09-02 – 2014-09-04 (×3): 40 mg via INTRAVENOUS
  Filled 2014-09-02 (×5): qty 40

## 2014-09-02 MED ORDER — IOHEXOL 300 MG/ML  SOLN
80.0000 mL | Freq: Once | INTRAMUSCULAR | Status: AC | PRN
  Administered 2014-09-02: 100 mL via INTRAVENOUS

## 2014-09-02 MED ORDER — ACETAMINOPHEN 325 MG PO TABS
650.0000 mg | ORAL_TABLET | Freq: Four times a day (QID) | ORAL | Status: DC | PRN
  Administered 2014-09-03 – 2014-09-07 (×2): 650 mg via ORAL
  Filled 2014-09-02 (×2): qty 2

## 2014-09-02 MED ORDER — HYDROMORPHONE HCL 1 MG/ML IJ SOLN
1.0000 mg | INTRAMUSCULAR | Status: DC | PRN
  Administered 2014-09-02 – 2014-09-07 (×45): 1 mg via INTRAVENOUS
  Filled 2014-09-02 (×45): qty 1

## 2014-09-02 MED ORDER — PIPERACILLIN-TAZOBACTAM 3.375 G IVPB
3.3750 g | Freq: Three times a day (TID) | INTRAVENOUS | Status: DC
Start: 2014-09-02 — End: 2014-09-05
  Administered 2014-09-02 – 2014-09-05 (×7): 3.375 g via INTRAVENOUS
  Filled 2014-09-02 (×10): qty 50

## 2014-09-02 MED ORDER — ACETAMINOPHEN 325 MG PO TABS
650.0000 mg | ORAL_TABLET | Freq: Once | ORAL | Status: AC
  Administered 2014-09-02: 650 mg via ORAL
  Filled 2014-09-02: qty 2

## 2014-09-02 MED ORDER — IOHEXOL 300 MG/ML  SOLN
25.0000 mL | Freq: Once | INTRAMUSCULAR | Status: AC | PRN
Start: 2014-09-02 — End: 2014-09-02
  Administered 2014-09-02: 25 mL via ORAL

## 2014-09-02 MED ORDER — HYDROMORPHONE HCL 1 MG/ML IJ SOLN
1.0000 mg | Freq: Once | INTRAMUSCULAR | Status: AC
  Administered 2014-09-02: 1 mg via INTRAVENOUS
  Filled 2014-09-02: qty 1

## 2014-09-02 MED ORDER — ONDANSETRON 4 MG PO TBDP
4.0000 mg | ORAL_TABLET | Freq: Four times a day (QID) | ORAL | Status: DC | PRN
  Filled 2014-09-02: qty 1

## 2014-09-02 MED ORDER — SODIUM CHLORIDE 0.9 % IV SOLN
INTRAVENOUS | Status: DC
  Administered 2014-09-02 – 2014-09-08 (×6): via INTRAVENOUS

## 2014-09-02 MED ORDER — ONDANSETRON HCL 4 MG/2ML IJ SOLN: 4.0000 mg | Freq: Four times a day (QID) | INTRAMUSCULAR | Status: DC | PRN

## 2014-09-02 MED ORDER — MORPHINE SULFATE (PF) 4 MG/ML IV SOLN: 4.0000 mg | Freq: Once | INTRAVENOUS | Status: DC

## 2014-09-02 MED ORDER — ENOXAPARIN SODIUM 40 MG/0.4ML ~~LOC~~ SOLN
40.0000 mg | SUBCUTANEOUS | Status: DC
  Administered 2014-09-02 – 2014-09-08 (×7): 40 mg via SUBCUTANEOUS
  Filled 2014-09-02 (×7): qty 0.4

## 2014-09-02 MED ORDER — ACETAMINOPHEN 650 MG RE SUPP: 650.0000 mg | Freq: Four times a day (QID) | RECTAL | Status: DC | PRN

## 2014-09-02 NOTE — ED Notes (Signed)
Pt from home via [facility] with c/o bleeding from his new ostomy site and fever of 102.  Pt took tylenol at 1130 with last axillary temp at 99.  Pt was seen on 08/20/13 for new pain s/p colon diverticulitis with perforation.  Pt went back to surgery at that time for a new perforation.  Had 4 inches of colon removed and ostomy placed, discharged on 08/31/14.  Pt in NAD, ambulatory, A&O.

## 2014-09-02 NOTE — ED Notes (Signed)
Patient transported to X-ray 

## 2014-09-02 NOTE — H&P (Signed)
Bryan [name] is an 44 y.o. male.   Chief Complaint: fever HPI: 56 yom s/p sigmoid colectomy with primary anastomosis and ileostomy discharged home two days ago presents with new fevers/chills. Also states has had some bleeding from stoma.  He underwent ct with collections near anastomosis.    Past Medical History  Diagnosis Date  . Pneumonia 1990  . Diverticulitis of colon with perforation 07/30/2014    acute diverticulititis with microperforation  . Appendicitis 1981  . Thigh hematoma 2013    Past Surgical History  Procedure Laterality Date  . Appendectomy  1981  . Laparoscopic partial colectomy N/A 08/26/2014    Procedure: LAPAROSCOPIC ASSISTED PARTIAL COLECTOMY WITH OSTOMY;  Surgeon: Erroll Luna, MD;  Location: [place] [facility];  Service: General;  Laterality: N/A;    Family History  Problem Relation Age of Onset  . Cancer Father     lung   Social History:  reports that he has never smoked. He has never used smokeless tobacco. He reports that he drinks alcohol. He reports that he does not use illicit drugs.  Allergies: No Known Allergies  meds reviewed  Results for orders placed or performed during the hospital encounter of 09/02/14 (from the past 48 hour(s))  Comprehensive metabolic panel     Status: Abnormal   Collection Time: 09/02/14  4:10 PM  Result Value Ref Range   Sodium 135 135 - 145 mmol/L   Potassium 4.0 3.5 - 5.1 mmol/L   Chloride 98 (L) 101 - 111 mmol/L   CO2 28 22 - 32 mmol/L   Glucose, Bld 120 (H) 65 - 99 mg/dL   BUN 11 6 - 20 mg/dL   Creatinine, Ser 1.33 (H) 0.61 - 1.24 mg/dL   Calcium 8.9 8.9 - 10.3 mg/dL   Total Protein 7.3 6.5 - 8.1 g/dL   Albumin 3.0 (L) 3.5 - 5.0 g/dL   AST 31 15 - 41 U/L   ALT 56 17 - 63 U/L   Alkaline Phosphatase 57 38 - 126 U/L   Total Bilirubin 1.0 0.3 - 1.2 mg/dL   GFR calc non Af Amer >60 >60 mL/min   GFR calc Af Amer >60 >60 mL/min    Comment: (NOTE) The eGFR has been calculated using the CKD EPI equation. This  calculation has not been validated in all clinical situations. eGFR's persistently <60 mL/min signify possible Chronic Kidney Disease.    Anion gap 9 5 - 15  Culture, blood (routine x 2)     Status: None (Preliminary result)   Collection Time: 09/02/14  4:10 PM  Result Value Ref Range   Specimen Description BLOOD RIGHT ANTECUBITAL    Special Requests BOTTLES DRAWN AEROBIC AND ANAEROBIC 10CC    Culture PENDING    Report Status PENDING   CBC with Differential     Status: Abnormal   Collection Time: 09/02/14  4:10 PM  Result Value Ref Range   WBC 17.7 (H) 4.0 - 10.5 K/uL   RBC 4.42 4.22 - 5.81 MIL/uL   Hemoglobin 12.1 (L) 13.0 - 17.0 g/dL   HCT 36.5 (L) 39.0 - 52.0 %   MCV 82.6 78.0 - 100.0 fL   MCH 27.4 26.0 - 34.0 pg   MCHC 33.2 30.0 - 36.0 g/dL   RDW 14.1 11.5 - 15.5 %   Platelets 366 150 - 400 K/uL   Neutrophils Relative % 85 (H) 43 - 77 %   Neutro Abs 15.1 (H) 1.7 - 7.7 K/uL   Lymphocytes Relative 7 (L)  12 - 46 %   Lymphs Abs 1.3 0.7 - 4.0 K/uL   Monocytes Relative 6 3 - 12 %   Monocytes Absolute 1.0 0.1 - 1.0 K/uL   Eosinophils Relative 2 0 - 5 %   Eosinophils Absolute 0.3 0.0 - 0.7 K/uL   Basophils Relative 0 0 - 1 %   Basophils Absolute 0.1 0.0 - 0.1 K/uL  I-Stat CG4 Lactic Acid, ED  (not at [facility])     Status: None   Collection Time: 09/02/14  4:21 PM  Result Value Ref Range   Lactic Acid, Venous 1.78 0.5 - 2.0 mmol/L  Urinalysis, Routine w reflex microscopic (not at [facility])     Status: Abnormal   Collection Time: 09/02/14  6:26 PM  Result Value Ref Range   Color, Urine YELLOW YELLOW   APPearance CLEAR CLEAR   Specific Gravity, Urine 1.014 1.005 - 1.030   pH 5.5 5.0 - 8.0   Glucose, UA NEGATIVE NEGATIVE mg/dL   Hgb urine dipstick LARGE (A) NEGATIVE   Bilirubin Urine NEGATIVE NEGATIVE   Ketones, ur NEGATIVE NEGATIVE mg/dL   Protein, ur 30 (A) NEGATIVE mg/dL   Urobilinogen, UA 0.2 0.0 - 1.0 mg/dL   Nitrite NEGATIVE NEGATIVE   Leukocytes, UA NEGATIVE NEGATIVE   Urine microscopic-add on     Status: None   Collection Time: 09/02/14  6:26 PM  Result Value Ref Range   WBC, UA 0-2 <3 WBC/hpf   RBC / HPF 11-20 <3 RBC/hpf   Bacteria, UA RARE RARE   Urine-Other MUCOUS PRESENT   I-Stat CG4 Lactic Acid, ED  (not at [facility])     Status: None   Collection Time: 09/02/14  7:34 PM  Result Value Ref Range   Lactic Acid, Venous 0.81 0.5 - 2.0 mmol/L   Ct Abdomen Pelvis W Contrast  09/02/2014   CLINICAL DATA:  New onset of fevers today. Recent colon surgery for perforated diverticulitis.  EXAM: CT ABDOMEN AND PELVIS WITH CONTRAST  TECHNIQUE: Multidetector CT imaging of the abdomen and pelvis was performed using the standard protocol following bolus administration of intravenous contrast.  CONTRAST:  125m OMNIPAQUE IOHEXOL 300 MG/ML  SOLN  COMPARISON:  CT scan dated 08/21/2014  FINDINGS: Lower chest: Minimal linear atelectasis at the left lung base. Heart size is normal. No effusions.  Hepatobiliary: Normal.  Pancreas: Normal.  Spleen: Normal.  Adrenals/Urinary Tract: 5 mm stone in the mid right kidney. Otherwise normal.  Stomach/Bowel: There is new ileostomy in the right mid abdomen. There is a soft tissue drain in the pelvis. There is extensive soft tissue stranding in the pericolonic fat in the right lower quadrant at the site of previous perforation. There is a small amount of fluid in the left pericolic gutter and in the anterior aspect of the abdomen just deep to the open incision. The soft tissue drain is fairly close to both of these collections. The bowel otherwise appears normal. There is several tiny bubbles of air in the soft tissues around the area of inflammation and prior perforation.  Vascular/Lymphatic: Minimal calcification in the abdominal aorta and iliac arteries.  Reproductive: Normal.  Musculoskeletal: No significant abnormality.  IMPRESSION: Prominent soft tissue stranding in the pericolonic fat around the sigmoid portion of the colon at the site of  previous perforation with to small pus collections, 1 in the left pericolic gutter on image number 69 than 1 just under the anterior abdominal wall in the midline on image number 77 both on series 2.  Electronically Signed   By: Lorriane Shire M.D.   On: 09/02/2014 19:01   Dg Chest Portable 1 View  09/02/2014   CLINICAL DATA:  Fever, history of colon surgery, abdominal pain  EXAM: PORTABLE CHEST - 1 VIEW  COMPARISON:  None.  FINDINGS: The heart size and mediastinal contours are within normal limits. Both lungs are clear. The visualized skeletal structures are unremarkable.  IMPRESSION: No active disease.   Electronically Signed   By: Kathreen Devoid   On: 09/02/2014 17:13    Review of Systems  Constitutional: Positive for fever and chills.  Gastrointestinal: Positive for abdominal pain and constipation.    Blood pressure 138/69, pulse 93, temperature 101.4 F (38.6 C), resp. rate 18, SpO2 98 %. Physical Exam  Vitals reviewed. Constitutional: He appears well-developed and well-nourished.  Eyes: No scleral icterus.  Cardiovascular: Normal rate, regular rhythm and normal heart sounds.   Respiratory: Effort normal and breath sounds normal. He has no wheezes. He has no rales.  GI: Soft.  Open wound with granulation tissue, ileostomy appears functional, drain with murky fluid, expected postop tenderness.   Skin: He is diaphoretic.     Assessment/Plan S/p sigmoid colectomy, fevers, ia abscess  Will admit, iv abx, recheck wbc in am, hopefully wont need drain or other intervention  Bryan [name] 09/02/2014, 8:27 PM

## 2014-09-02 NOTE — ED Provider Notes (Signed)
CSN: [id number]     Arrival date & time 09/02/14  1551 History   First MD Initiated Contact with Patient 09/02/14 [phone]     Chief Complaint  Patient presents with  . Post-op Problem     (Consider location/radiation/quality/duration/timing/severity/associated sxs/prior Treatment) The history is provided by the patient and medical records. No language interpreter was used.   Patient is a 44 year old male with past medical history of diverticulitis with colon perforation approximately one week ago who is now status post partial colectomy with ostomy bag placement who presents today with fever and abdominal pain. Patient states he had the surgery 1 week ago. He's continued to have pain since his discharge from the hospital 2 days ago, however today when wound care nurse was with him she noted that he had a temperature 102. Patient had also noticed bloody output into his ostomy bag and also a change in the color of the drain that was placed at the time of surgery. He states initially it was a clear red color, but now is a dark red. He does not appreciate a significant increase in the pain. Does not feel that his abdomen is more distended. He denies any hematuria or dysuria. States she's had a mild cough since discharge however he states this hasn't been very significant and he has not been short of breath. Denies any swelling in his legs bilaterally. He denies chest pain.   Past Medical History  Diagnosis Date  . Pneumonia 1990  . Diverticulitis of colon with perforation 07/30/2014    acute diverticulititis with microperforation  . Appendicitis 1981  . Thigh hematoma 2013   Past Surgical History  Procedure Laterality Date  . Appendectomy  1981  . Laparoscopic partial colectomy N/A 08/26/2014    Procedure: LAPAROSCOPIC ASSISTED PARTIAL COLECTOMY WITH OSTOMY;  Surgeon: Harriette Bouillon, MD;  Location: [facility] OR;  Service: General;  Laterality: N/A;   Family History  Problem Relation Age of Onset  .  Cancer Father     lung   Social History  Substance Use Topics  . Smoking status: Never Smoker   . Smokeless tobacco: Never Used  . Alcohol Use: Yes     Comment: 07/30/2014 "might have a beer/month, if that"    Review of Systems  Constitutional: Positive for fever (starting today.) and chills.  HENT: Negative for congestion and rhinorrhea.   Eyes: Negative for photophobia and visual disturbance.  Respiratory: Negative for shortness of breath and wheezing.   Cardiovascular: Negative for chest pain and palpitations.  Gastrointestinal: Positive for abdominal pain (The surgical site. States not significantly worsened since time of discharge.). Negative for nausea and vomiting.       JP drain notably has a dark red drainage.   Genitourinary: Negative for dysuria and difficulty urinating.  Musculoskeletal: Negative for back pain and neck pain.  Skin: Negative for pallor and rash.  Neurological: Negative for light-headedness, numbness and headaches.  Psychiatric/Behavioral: Negative for confusion and agitation.  All other systems reviewed and are negative.     Allergies  Review of patient's allergies indicates no known allergies.  Home Medications   Prior to Admission medications   Medication Sig Start Date End Date Taking? Authorizing Provider  acetaminophen (TYLENOL) 325 MG tablet Take 2 tablets (650 mg total) by mouth every 6 (six) hours as needed for mild pain (or temp > 100). 08/31/14  Yes Nonie Hoyer, PA-C  amoxicillin-clavulanate (AUGMENTIN) 875-125 MG per tablet Take 1 tablet by mouth every 12 (twelve) hours.  08/31/14  Yes Nonie Hoyer, PA-C  methocarbamol (ROBAXIN) 500 MG tablet Take 2 tablets (1,000 mg total) by mouth every 8 (eight) hours as needed for muscle spasms (or pain). 08/31/14  Yes Nonie Hoyer, PA-C  oxyCODONE (OXY IR/ROXICODONE) 5 MG immediate release tablet Take 1-3 tablets (5-15 mg total) by mouth every 6 (six) hours as needed for moderate pain, severe pain or  breakthrough pain. 08/31/14  Yes Megan N Baird, PA-C   BP 123/69 mmHg  Pulse 93  Temp(Src) 99.5 F (37.5 C) (Oral)  Resp 18  SpO2 97% Physical Exam  Constitutional: He is oriented to person, place, and time. He appears well-developed and well-nourished. He appears ill. No distress.  HENT:  Head: Normocephalic and atraumatic.  Eyes: Conjunctivae and EOM are normal.  Neck: Normal range of motion. Neck supple.  Cardiovascular: Normal rate, regular rhythm and normal heart sounds.   No murmur heard. Pulmonary/Chest: Effort normal and breath sounds normal. No respiratory distress. He has no wheezes.  Abdominal: Soft. He exhibits no distension. There is tenderness (Moderate tenderness in the right lower and left lower quadrant. ). There is no guarding.  Patient has a JP drain in place that is draining sanguinous fluid. No purulent drainage is seen from the wound site which appears dry.   Musculoskeletal: Normal range of motion. He exhibits no tenderness.  Neurological: He is alert and oriented to person, place, and time.  Skin: Skin is warm and dry. He is not diaphoretic.  Psychiatric: He has a normal mood and affect. His behavior is normal.    ED Course  Procedures (including critical care time) Labs Review Labs Reviewed  COMPREHENSIVE METABOLIC PANEL - Abnormal; Notable for the following:    Chloride 98 (*)    Glucose, Bld 120 (*)    Creatinine, Ser 1.33 (*)    Albumin 3.0 (*)    All other components within normal limits  URINALYSIS, ROUTINE W REFLEX MICROSCOPIC (NOT AT [facility]) - Abnormal; Notable for the following:    Hgb urine dipstick LARGE (*)    Protein, ur 30 (*)    All other components within normal limits  CBC WITH DIFFERENTIAL/PLATELET - Abnormal; Notable for the following:    WBC 17.7 (*)    Hemoglobin 12.1 (*)    HCT 36.5 (*)    Neutrophils Relative % 85 (*)    Neutro Abs 15.1 (*)    Lymphocytes Relative 7 (*)    All other components within normal limits  CULTURE,  BLOOD (ROUTINE X 2)  CULTURE, BLOOD (ROUTINE X 2)  URINE CULTURE  URINE MICROSCOPIC-ADD ON  CBC  COMPREHENSIVE METABOLIC PANEL  I-STAT CG4 LACTIC ACID, ED  I-STAT CG4 LACTIC ACID, ED    Imaging Review Ct Abdomen Pelvis W Contrast  09/02/2014   CLINICAL DATA:  New onset of fevers today. Recent colon surgery for perforated diverticulitis.  EXAM: CT ABDOMEN AND PELVIS WITH CONTRAST  TECHNIQUE: Multidetector CT imaging of the abdomen and pelvis was performed using the standard protocol following bolus administration of intravenous contrast.  CONTRAST:  OMNIPAQUE IOHEXOL 300 MG/ML  SOLN  COMPARISON:  CT scan dated 08/21/2014  FINDINGS: Lower chest: Minimal linear atelectasis at the left lung base. Heart size is normal. No effusions.  Hepatobiliary: Normal.  Pancreas: Normal.  Spleen: Normal.  Adrenals/Urinary Tract: 5 mm stone in the mid right kidney. Otherwise normal.  Stomach/Bowel: There is new ileostomy in the right mid abdomen. There is a soft tissue drain in the pelvis.  There is extensive soft tissue stranding in the pericolonic fat in the right lower quadrant at the site of previous perforation. There is a small amount of fluid in the left pericolic gutter and in the anterior aspect of the abdomen just deep to the open incision. The soft tissue drain is fairly close to both of these collections. The bowel otherwise appears normal. There is several tiny bubbles of air in the soft tissues around the area of inflammation and prior perforation.  Vascular/Lymphatic: Minimal calcification in the abdominal aorta and iliac arteries.  Reproductive: Normal.  Musculoskeletal: No significant abnormality.  IMPRESSION: Prominent soft tissue stranding in the pericolonic fat around the sigmoid portion of the colon at the site of previous perforation with to small pus collections, 1 in the left pericolic gutter on image number 69 than 1 just under the anterior abdominal wall in the midline on image number 77 both  on series 2.   Electronically Signed   By: Francene Boyers M.D.   On: 09/02/2014 19:01   Dg Chest Portable 1 View  09/02/2014   CLINICAL DATA:  Fever, history of colon surgery, abdominal pain  EXAM: PORTABLE CHEST - 1 VIEW  COMPARISON:  None.  FINDINGS: The heart size and mediastinal contours are within normal limits. Both lungs are clear. The visualized skeletal structures are unremarkable.  IMPRESSION: No active disease.   Electronically Signed   By: Elige Ko   On: 09/02/2014 17:13   I have personally reviewed and evaluated these images and lab results as part of my medical decision-making.   EKG Interpretation None      MDM   Final diagnoses:  Other postoperative complication involving digestive system  Lower abdominal pain  Fever in adult    Patient is a 44 year old male with past medical history of diverticulitis with colon perforation approximately one week ago who is now status post partial colectomy with ostomy bag placement who presents today with fever and abdominal pain. Given the amount of time since surgery concern is for developing abscess. Discussed with on-call surgeon Dr. Dwain Sarna after getting a CT scan that showed increased stranding at the surgical site. He'll admit the patient for further observation and IV antibiotics. Patient is aware of this plan and is agreeable with this. Patient did notably have a white count of 17 which supports acute infection. Patient also became febrile here in the department up to 101. Lactate is not elevated. Patient is otherwise stable.    Madolyn Frieze, MD 09/03/14 [phone]

## 2014-09-02 NOTE — ED Provider Notes (Signed)
I saw and evaluated the patient, reviewed the resident's note and I agree with the findings and plan.   EKG Interpretation None      Results for orders placed or performed during the hospital encounter of 09/02/14  Comprehensive metabolic panel  Result Value Ref Range   Sodium 135 135 - 145 mmol/L   Potassium 4.0 3.5 - 5.1 mmol/L   Chloride 98 (L) 101 - 111 mmol/L   CO2 28 22 - 32 mmol/L   Glucose, Bld 120 (H) 65 - 99 mg/dL   BUN 11 6 - 20 mg/dL   Creatinine, Ser 1.61 (H) 0.61 - 1.24 mg/dL   Calcium 8.9 8.9 - 09.6 mg/dL   Total Protein 7.3 6.5 - 8.1 g/dL   Albumin 3.0 (L) 3.5 - 5.0 g/dL   AST 31 15 - 41 U/L   ALT 56 17 - 63 U/L   Alkaline Phosphatase 57 38 - 126 U/L   Total Bilirubin 1.0 0.3 - 1.2 mg/dL   GFR calc non Af Amer >60 >60 mL/min   GFR calc Af Amer >60 >60 mL/min   Anion gap 9 5 - 15  CBC with Differential  Result Value Ref Range   WBC 17.7 (H) 4.0 - 10.5 K/uL   RBC 4.42 4.22 - 5.81 MIL/uL   Hemoglobin 12.1 (L) 13.0 - 17.0 g/dL   HCT 04.5 (L) 40.9 - 81.1 %   MCV 82.6 78.0 - 100.0 fL   MCH 27.4 26.0 - 34.0 pg   MCHC 33.2 30.0 - 36.0 g/dL   RDW 91.4 78.2 - 95.6 %   Platelets 366 150 - 400 K/uL   Neutrophils Relative % 85 (H) 43 - 77 %   Neutro Abs 15.1 (H) 1.7 - 7.7 K/uL   Lymphocytes Relative 7 (L) 12 - 46 %   Lymphs Abs 1.3 0.7 - 4.0 K/uL   Monocytes Relative 6 3 - 12 %   Monocytes Absolute 1.0 0.1 - 1.0 K/uL   Eosinophils Relative 2 0 - 5 %   Eosinophils Absolute 0.3 0.0 - 0.7 K/uL   Basophils Relative 0 0 - 1 %   Basophils Absolute 0.1 0.0 - 0.1 K/uL  I-Stat CG4 Lactic Acid, ED  (not at [facility])  Result Value Ref Range   Lactic Acid, Venous 1.78 0.5 - 2.0 mmol/L   Ct Abdomen Pelvis W Contrast  08/21/2014   CLINICAL DATA:  Two days of progressive abdominal pain  EXAM: CT ABDOMEN AND PELVIS WITH CONTRAST  TECHNIQUE: Multidetector CT imaging of the abdomen and pelvis was performed using the standard protocol following bolus administration of intravenous  contrast. Oral contrast was also administered.  CONTRAST:  OMNIPAQUE IOHEXOL 300 MG/ML  SOLN  COMPARISON:  July 30, 2014  FINDINGS: Lung bases are clear.  There appears to be mild hepatic steatosis. No focal liver lesions are identified. Gallbladder wall is not thickened. There is no biliary duct dilatation.  Spleen, pancreas, and adrenals appear normal.  There is a nonobstructing 5 mm calculus in the mid right kidney. No other intrarenal calculi are identified. There is no demonstrable renal mass or hydronephrosis on either side. There are no ureteral calculi on either side.  In the pelvis, urinary bladder is midline with normal wall thickness. There is no pelvic mass. There is fat in each inguinal ring.  There is again noted wall thickening in the sigmoid colon with surrounding mesenteric inflammation consistent with diverticulitis. There is extraluminal air in the proximal sigmoid colon region  with surrounding inflammation, consistent with a degree of perforation. There is slightly more extraluminal air in this area compared to recent prior study, although this air is confined to the left pelvic region. There is slightly more fatty inflammation in this area compared to prior study. There is loculated fluid in this area, although there is no well-defined abscess in this region currently.  There is no bowel obstruction. No free air or portal venous air. Appendix is absent.  There is no ascites, adenopathy, or abscess in the abdomen or pelvis. There is atherosclerotic change in the distal aorta but no aneurysm. There is degenerative change in the lumbar spine.  There is a benign appearing lesion in the superior left acetabulum measuring 3.2 x 1.7 cm. A similar appearing lesion is also present in the intertrochanteric left femur region measuring 2.1 x 1.6 cm. No destructive bone lesions are appreciable.  IMPRESSION: Diverticulitis in the proximal sigmoid colon with evidence of perforation again noted. There is  slightly more extraluminal air in this area compared to the previous study with slightly more involvement of surrounding fat. A well-defined abscess is not seen. Well-defined abscess is not currently appreciable.  No other inflammatory foci are identified elsewhere. Note that inflammation extends into the left inguinal ring with fatty stranding, stable from previous study.  Elsewhere, there is no bowel obstruction. No abscess. Appendix absent.  There is a 5 mm calculus in the right kidney, nonobstructing. No ureteral calculi. No hydronephrosis. Inflammation extends to the level of the superior urinary bladder; the urinary bladder wall does not appear appreciably thickened, however.  Critical Value/emergent results were called by telephone at the time of interpretation on 08/21/2014 at 1:17 pm to [facility], [facility] , who verbally acknowledged these results.   Electronically Signed   By: Bretta Bang III M.D.   On: 08/21/2014 13:18   Dg Chest Portable 1 View  09/02/2014   CLINICAL DATA:  Fever, history of colon surgery, abdominal pain  EXAM: PORTABLE CHEST - 1 VIEW  COMPARISON:  None.  FINDINGS: The heart size and mediastinal contours are within normal limits. Both lungs are clear. The visualized skeletal structures are unremarkable.  IMPRESSION: No active disease.   Electronically Signed   By: Elige Ko   On: 09/02/2014 17:13    Patient seen by me. Patient 1 week postop following a perforated diverticulitis of with the colostomy. Patient has been out of the hospital since Monday patient with onset of fevers today and some abdominal discomfort. Patient noted a little bit of blood in the colostomy output but no frank or gross blood in large numbers. Patient will get workup for postop fevers which will include chest x-ray today's chest x-ray is negative include lab work CT of the abdomen and making sure there is no urinary tract infection. Clinically highly suspicious for intra-abdominal process. Most likely  cause would be development of abscesses. As the ring around  Bryan Mulders, MD 09/02/14 1727

## 2014-09-03 ENCOUNTER — Encounter (HOSPITAL_COMMUNITY): Payer: Self-pay | Admitting: General Practice

## 2014-09-03 LAB — COMPREHENSIVE METABOLIC PANEL
ALK PHOS: 48 U/L (ref 38–126)
ALT: 40 U/L (ref 17–63)
ANION GAP: 8 (ref 5–15)
AST: 22 U/L (ref 15–41)
Albumin: 2.6 g/dL — ABNORMAL LOW (ref 3.5–5.0)
BUN: 10 mg/dL (ref 6–20)
CALCIUM: 8.4 mg/dL — AB (ref 8.9–10.3)
CO2: 26 mmol/L (ref 22–32)
Chloride: 100 mmol/L — ABNORMAL LOW (ref 101–111)
Creatinine, Ser: 1.29 mg/dL — ABNORMAL HIGH (ref 0.61–1.24)
Glucose, Bld: 107 mg/dL — ABNORMAL HIGH (ref 65–99)
Potassium: 3.7 mmol/L (ref 3.5–5.1)
SODIUM: 134 mmol/L — AB (ref 135–145)
TOTAL PROTEIN: 6.6 g/dL (ref 6.5–8.1)
Total Bilirubin: 1 mg/dL (ref 0.3–1.2)

## 2014-09-03 LAB — CBC
HCT: 33.5 % — ABNORMAL LOW (ref 39.0–52.0)
HEMOGLOBIN: 11.1 g/dL — AB (ref 13.0–17.0)
MCH: 27.5 pg (ref 26.0–34.0)
MCHC: 33.1 g/dL (ref 30.0–36.0)
MCV: 83.1 fL (ref 78.0–100.0)
Platelets: 329 10*3/uL (ref 150–400)
RBC: 4.03 MIL/uL — ABNORMAL LOW (ref 4.22–5.81)
RDW: 14.2 % (ref 11.5–15.5)
WBC: 13 10*3/uL — ABNORMAL HIGH (ref 4.0–10.5)

## 2014-09-03 NOTE — Progress Notes (Signed)
Utilization review completed. Dajsha Massaro, RN, BSN. 

## 2014-09-03 NOTE — Progress Notes (Signed)
Subjective: He is feeling less pain this morning Still with low grade temp  Objective: Vital signs in last 24 hours: Temp:  [99.5 F (37.5 C)-101.4 F (38.6 C)] 100.6 F (38.1 C) (08/18 0549) Pulse Rate:  [85-93] 93 (08/18 0549) Resp:  [18] 18 (08/18 0549) BP: (107-138)/(63-79) 119/66 mmHg (08/18 0549) SpO2:  [95 %-99 %] 96 % (08/18 0549) Last BM Date: 09/02/14  Intake/Output from previous day: 08/17 0701 - 08/18 0700 In: 640 [P.O.:240; I.V.:400] Out: 1485 [Urine:825; Drains:10; Stool:450] Intake/Output this shift:    Abdomen is soft with minimal tenderness Drain serosang Ileostomy with good output  Lab Results:   Recent Labs  09/02/14 1610 09/03/14 0637  WBC 17.7* 13.0*  HGB 12.1* 11.1*  HCT 36.5* 33.5*  PLT 366 329   BMET  Recent Labs  09/02/14 1610 09/03/14 0637  NA 135 134*  K 4.0 3.7  CL 98* 100*  CO2 28 26  GLUCOSE 120* 107*  BUN 11 10  CREATININE 1.33* 1.29*  CALCIUM 8.9 8.4*   PT/INR No results for input(s): LABPROT, INR in the last 72 hours. ABG No results for input(s): PHART, HCO3 in the last 72 hours.  Invalid input(s): PCO2, PO2  Studies/Results: Ct Abdomen Pelvis W Contrast  09/02/2014   CLINICAL DATA:  New onset of fevers today. Recent colon surgery for perforated diverticulitis.  EXAM: CT ABDOMEN AND PELVIS WITH CONTRAST  TECHNIQUE: Multidetector CT imaging of the abdomen and pelvis was performed using the standard protocol following bolus administration of intravenous contrast.  CONTRAST:  OMNIPAQUE IOHEXOL 300 MG/ML  SOLN  COMPARISON:  CT scan dated 08/21/2014  FINDINGS: Lower chest: Minimal linear atelectasis at the left lung base. Heart size is normal. No effusions.  Hepatobiliary: Normal.  Pancreas: Normal.  Spleen: Normal.  Adrenals/Urinary Tract: 5 mm stone in the mid right kidney. Otherwise normal.  Stomach/Bowel: There is new ileostomy in the right mid abdomen. There is a soft tissue drain in the pelvis. There is  extensive soft tissue stranding in the pericolonic fat in the right lower quadrant at the site of previous perforation. There is a small amount of fluid in the left pericolic gutter and in the anterior aspect of the abdomen just deep to the open incision. The soft tissue drain is fairly close to both of these collections. The bowel otherwise appears normal. There is several tiny bubbles of air in the soft tissues around the area of inflammation and prior perforation.  Vascular/Lymphatic: Minimal calcification in the abdominal aorta and iliac arteries.  Reproductive: Normal.  Musculoskeletal: No significant abnormality.  IMPRESSION: Prominent soft tissue stranding in the pericolonic fat around the sigmoid portion of the colon at the site of previous perforation with to small pus collections, 1 in the left pericolic gutter on image number 69 than 1 just under the anterior abdominal wall in the midline on image number 77 both on series 2.   Electronically Signed   By: Francene Boyers M.D.   On: 09/02/2014 19:01   Dg Chest Portable 1 View  09/02/2014   CLINICAL DATA:  Fever, history of colon surgery, abdominal pain  EXAM: PORTABLE CHEST - 1 VIEW  COMPARISON:  None.  FINDINGS: The heart size and mediastinal contours are within normal limits. Both lungs are clear. The visualized skeletal structures are unremarkable.  IMPRESSION: No active disease.   Electronically Signed   By: Elige Ko   On: 09/02/2014 17:13    Anti-infectives: Anti-infectives    Start  Dose/Rate Route Frequency Ordered Stop   09/02/14 2200  piperacillin-tazobactam (ZOSYN) IVPB 3.375 g     3.375 g 12.5 mL/hr over 240 Minutes Intravenous 3 times per day 09/02/14 2133        Assessment/Plan:  S/p sigmoid colectomy with anastomosis, ileostomy, admitted with fever, small intra-abdominal abscesses  WBC improved.  Continuing IV antibiotics and drain.  Repeat in the morning Advance to full liquids  LOS: 1 day    Yakima Kreitzer  A 09/03/2014

## 2014-09-04 LAB — URINE CULTURE: Culture: NO GROWTH

## 2014-09-04 LAB — CBC
HCT: 30.9 % — ABNORMAL LOW (ref 39.0–52.0)
Hemoglobin: 10.2 g/dL — ABNORMAL LOW (ref 13.0–17.0)
MCH: 27.2 pg (ref 26.0–34.0)
MCHC: 33 g/dL (ref 30.0–36.0)
MCV: 82.4 fL (ref 78.0–100.0)
PLATELETS: 361 10*3/uL (ref 150–400)
RBC: 3.75 MIL/uL — ABNORMAL LOW (ref 4.22–5.81)
RDW: 13.9 % (ref 11.5–15.5)
WBC: 10.2 10*3/uL (ref 4.0–10.5)

## 2014-09-04 NOTE — Progress Notes (Signed)
[facility] Progress Note     Subjective: Pt feels better.  Low grade temps, but leukocytosis improved.  Still with some pain.  Feels stronger.  Tolerating full liquids.     Objective: Vital signs in last 24 hours: Temp:  [99.5 F (37.5 C)-99.7 F (37.6 C)] 99.5 F (37.5 C) (08/19 0630) Pulse Rate:  [88-93] 88 (08/19 0630) Resp:  [16] 16 (08/19 0630) BP: (121-126)/(63-67) 124/66 mmHg (08/19 0630) SpO2:  [95 %-98 %] 97 % (08/19 0630) Last BM Date: 09/03/14  Intake/Output from previous day: 08/18 0701 - 08/19 0700 In: 960 [P.O.:960] Out: 870 [Stool:870] Intake/Output this shift: Total I/O In: 360 [P.O.:360] Out: 200 [Stool:200]  PE: Gen:  Alert, NAD, pleasant Abd: Soft, mild tenderness, ND, +BS, no HSM, midline wound clean with only sanguinous drainage, drain with cloudy sanguinous drainage, ostomy pink with stool and flatus output.   Lab Results:   Recent Labs  09/03/14 0637 09/04/14 0401  WBC 13.0* 10.2  HGB 11.1* 10.2*  HCT 33.5* 30.9*  PLT 329 361   BMET  Recent Labs  09/02/14 1610 09/03/14 0637  NA 135 134*  K 4.0 3.7  CL 98* 100*  CO2 28 26  GLUCOSE 120* 107*  BUN 11 10  CREATININE 1.33* 1.29*  CALCIUM 8.9 8.4*   PT/INR No results for input(s): LABPROT, INR in the last 72 hours. CMP     Component Value Date/Time   NA 134* 09/03/2014 0637   K 3.7 09/03/2014 0637   CL 100* 09/03/2014 0637   CO2 26 09/03/2014 0637   GLUCOSE 107* 09/03/2014 0637   BUN 10 09/03/2014 0637   CREATININE 1.29* 09/03/2014 0637   CALCIUM 8.4* 09/03/2014 0637   PROT 6.6 09/03/2014 0637   ALBUMIN 2.6* 09/03/2014 0637   AST 22 09/03/2014 0637   ALT 40 09/03/2014 0637   ALKPHOS 48 09/03/2014 0637   BILITOT 1.0 09/03/2014 0637   GFRNONAA >60 09/03/2014 0637   GFRAA >60 09/03/2014 0637   Lipase     Component Value Date/Time   LIPASE 27 08/21/2014 1008       Studies/Results: Ct Abdomen Pelvis W Contrast  09/02/2014   CLINICAL DATA:  New onset  of fevers today. Recent colon surgery for perforated diverticulitis.  EXAM: CT ABDOMEN AND PELVIS WITH CONTRAST  TECHNIQUE: Multidetector CT imaging of the abdomen and pelvis was performed using the standard protocol following bolus administration of intravenous contrast.  CONTRAST:  OMNIPAQUE IOHEXOL 300 MG/ML  SOLN  COMPARISON:  CT scan dated 08/21/2014  FINDINGS: Lower chest: Minimal linear atelectasis at the left lung base. Heart size is normal. No effusions.  Hepatobiliary: Normal.  Pancreas: Normal.  Spleen: Normal.  Adrenals/Urinary Tract: 5 mm stone in the mid right kidney. Otherwise normal.  Stomach/Bowel: There is new ileostomy in the right mid abdomen. There is a soft tissue drain in the pelvis. There is extensive soft tissue stranding in the pericolonic fat in the right lower quadrant at the site of previous perforation. There is a small amount of fluid in the left pericolic gutter and in the anterior aspect of the abdomen just deep to the open incision. The soft tissue drain is fairly close to both of these collections. The bowel otherwise appears normal. There is several tiny bubbles of air in the soft tissues around the area of inflammation and prior perforation.  Vascular/Lymphatic: Minimal calcification in the abdominal aorta and iliac arteries.  Reproductive: Normal.  Musculoskeletal: No significant abnormality.  IMPRESSION: Prominent  soft tissue stranding in the pericolonic fat around the sigmoid portion of the colon at the site of previous perforation with to small pus collections, 1 in the left pericolic gutter on image number 69 than 1 just under the anterior abdominal wall in the midline on image number 77 both on series 2.   Electronically Signed   By: Francene Boyers M.D.   On: 09/02/2014 19:01   Dg Chest Portable 1 View  09/02/2014   CLINICAL DATA:  Fever, history of colon surgery, abdominal pain  EXAM: PORTABLE CHEST - 1 VIEW  COMPARISON:  None.  FINDINGS: The heart size and  mediastinal contours are within normal limits. Both lungs are clear. The visualized skeletal structures are unremarkable.  IMPRESSION: No active disease.   Electronically Signed   By: Elige Ko   On: 09/02/2014 17:13    Anti-infectives: Anti-infectives    Start     Dose/Rate Route Frequency Ordered Stop   09/02/14 2200  piperacillin-tazobactam (ZOSYN) IVPB 3.375 g     3.375 g 12.5 mL/hr over 240 Minutes Intravenous 3 times per day 09/02/14 2133         Assessment/Plan S/p sigmoid colectomy with anastomosis, ileostomy, admitted with fever, small intra-abdominal abscesses -WBC normalized, but low grade temps tmax 99.5. Continuing IV antibiotics.  Consider switching to orals today or tomorrow.  May need to be on cipro/flagyl instead of augmentin since he was sent home before on augmentin.   -Advance to soft diet -Ambulate and IS -continue JP drain, some cloudy sanguinous drainage -Will likely need repeat CT at some point -Continue inpatient today.    LOS: 2 days    Nonie Hoyer 09/04/2014, 10:14 AM Pager: [phone]

## 2014-09-05 MED ORDER — SODIUM CHLORIDE 0.9 % IV SOLN
1.0000 g | INTRAVENOUS | Status: DC
  Administered 2014-09-05 – 2014-09-09 (×5): 1 g via INTRAVENOUS
  Filled 2014-09-05 (×6): qty 1

## 2014-09-05 MED ORDER — PANTOPRAZOLE SODIUM 40 MG PO TBEC
40.0000 mg | DELAYED_RELEASE_TABLET | Freq: Every day | ORAL | Status: DC
  Administered 2014-09-05 – 2014-09-08 (×4): 40 mg via ORAL
  Filled 2014-09-05 (×4): qty 1

## 2014-09-05 NOTE — Progress Notes (Signed)
  Subjective: Patient is still having some low-grade fever and pelvic pain Drain output now feculent in appearance and odor  Objective: Vital signs in last 24 hours: Temp:  [98.6 F (37 C)-100.1 F (37.8 C)] 98.6 F (37 C) (08/20 0531) Pulse Rate:  [73-90] 73 (08/20 0531) Resp:  [17-18] 18 (08/20 0531) BP: (106-119)/(69-70) 119/70 mmHg (08/20 0531) SpO2:  [95 %-98 %] 95 % (08/20 0531) Last BM Date: 09/03/14  Intake/Output from previous day: 08/19 0701 - 08/20 0700 In: 840 [P.O.:840] Out: 880 [Drains:30; Stool:850] Intake/Output this shift: Total I/O In: 240 [P.O.:240] Out: 375 [Stool:375]  General appearance: alert, cooperative and no distress GI: soft, tender in lower abdomen/pelvis Drain with feculent-smelling tan drainage; wound - granulating well  Lab Results:   Recent Labs  09/03/14 0637 09/04/14 0401  WBC 13.0* 10.2  HGB 11.1* 10.2*  HCT 33.5* 30.9*  PLT 329 361   BMET  Recent Labs  09/02/14 1610 09/03/14 0637  NA 135 134*  K 4.0 3.7  CL 98* 100*  CO2 28 26  GLUCOSE 120* 107*  BUN 11 10  CREATININE 1.33* 1.29*  CALCIUM 8.9 8.4*   PT/INR No results for input(s): LABPROT, INR in the last 72 hours. ABG No results for input(s): PHART, HCO3 in the last 72 hours.  Invalid input(s): PCO2, PO2  Studies/Results: No results found.  Anti-infectives: Anti-infectives    Start     Dose/Rate Route Frequency Ordered Stop   09/05/14 1030  ertapenem (INVANZ) 1 g in sodium chloride 0.9 % 50 mL IVPB     1 g 100 mL/hr over 30 Minutes Intravenous Every 24 hours 09/05/14 1016     09/02/14 2200  piperacillin-tazobactam (ZOSYN) IVPB 3.375 g  Status:  Discontinued     3.375 g 12.5 mL/hr over 240 Minutes Intravenous 3 times per day 09/02/14 2133 09/05/14 1016      Assessment/Plan: s/p * No surgery found * Probable leak at colorectal anastomosis - drain in place and diverted with loop ileostomy  Change abx from Zosyn to [employer]. Recheck WBC in AM.   LOS:  3 days    Areil Ottey K. 09/05/2014

## 2014-09-06 LAB — COMPREHENSIVE METABOLIC PANEL
ALBUMIN: 2.6 g/dL — AB (ref 3.5–5.0)
ALK PHOS: 51 U/L (ref 38–126)
ALT: 40 U/L (ref 17–63)
ANION GAP: 10 (ref 5–15)
AST: 31 U/L (ref 15–41)
BUN: 7 mg/dL (ref 6–20)
CALCIUM: 8.6 mg/dL — AB (ref 8.9–10.3)
CO2: 26 mmol/L (ref 22–32)
Chloride: 100 mmol/L — ABNORMAL LOW (ref 101–111)
Creatinine, Ser: 1.3 mg/dL — ABNORMAL HIGH (ref 0.61–1.24)
GFR calc Af Amer: >60 mL/min (ref >60)
GFR calc non Af Amer: >60 mL/min (ref >60)
GLUCOSE: 92 mg/dL (ref 65–99)
POTASSIUM: 3.7 mmol/L (ref 3.5–5.1)
SODIUM: 136 mmol/L (ref 135–145)
Total Bilirubin: 0.4 mg/dL (ref 0.3–1.2)
Total Protein: 6.7 g/dL (ref 6.5–8.1)

## 2014-09-06 LAB — CBC
HCT: 32.2 % — ABNORMAL LOW (ref 39.0–52.0)
HEMOGLOBIN: 10.5 g/dL — AB (ref 13.0–17.0)
MCH: 26.8 pg (ref 26.0–34.0)
MCHC: 32.6 g/dL (ref 30.0–36.0)
MCV: 82.1 fL (ref 78.0–100.0)
Platelets: 435 10*3/uL — ABNORMAL HIGH (ref 150–400)
RBC: 3.92 MIL/uL — ABNORMAL LOW (ref 4.22–5.81)
RDW: 14.1 % (ref 11.5–15.5)
WBC: 9.6 10*3/uL (ref 4.0–10.5)

## 2014-09-06 NOTE — Progress Notes (Signed)
  Subjective: Still with some suprapubic tenderness Tolerating soft diet Drain still with foul-smelling drainage - 35 cc output  Objective: Vital signs in last 24 hours: Temp:  [98.1 F (36.7 C)-99.1 F (37.3 C)] 98.1 F (36.7 C) (08/21 0521) Pulse Rate:  [73-95] 73 (08/21 0521) Resp:  [17-19] 18 (08/21 0521) BP: (112-119)/(62-65) 112/65 mmHg (08/21 0521) SpO2:  [96 %-98 %] 96 % (08/21 0521) Last BM Date:  (illeostomy)  Intake/Output from previous day: 08/20 0701 - 08/21 0700 In: 770 [P.O.:480; I.V.:240; IV Piggyback:50] Out: 1075 [Drains:35; Stool:1040] Intake/Output this shift: Total I/O In: -  Out: 220 [Drains:20; Stool:200]  General appearance: alert, cooperative and no distress Resp: clear to auscultation bilaterally Cardio: regular rate and rhythm, S1, S2 normal, no murmur, click, rub or gallop GI: soft, tender suprapubic; drain with foul-smelling feculent drainage Wound - clean; granulating  Lab Results:   Recent Labs  09/04/14 0401 09/06/14 0548  WBC 10.2 9.6  HGB 10.2* 10.5*  HCT 30.9* 32.2*  PLT 361 435*   BMET  Recent Labs  09/06/14 0548  NA 136  K 3.7  CL 100*  CO2 26  GLUCOSE 92  BUN 7  CREATININE 1.30*  CALCIUM 8.6*   PT/INR No results for input(s): LABPROT, INR in the last 72 hours. ABG No results for input(s): PHART, HCO3 in the last 72 hours.  Invalid input(s): PCO2, PO2  Studies/Results: No results found.  Anti-infectives: Anti-infectives    Start     Dose/Rate Route Frequency Ordered Stop   09/05/14 1100  ertapenem (INVANZ) 1 g in sodium chloride 0.9 % 50 mL IVPB     1 g 100 mL/hr over 30 Minutes Intravenous Every 24 hours 09/05/14 1016     09/02/14 2200  piperacillin-tazobactam (ZOSYN) IVPB 3.375 g  Status:  Discontinued     3.375 g 12.5 mL/hr over 240 Minutes Intravenous 3 times per day 09/02/14 2133 09/05/14 1016      Assessment/Plan: s/p * No surgery found * s/p sigmoid colectomy/ loop ileostomy; probable leak  at colorectal anastomosis  Drain in place Diverted at loop ileostomy Continue Invanz Consider wound VAC in AM   LOS: 4 days    Rossetta Kama K. 09/06/2014

## 2014-09-07 LAB — BASIC METABOLIC PANEL
ANION GAP: 7 (ref 5–15)
BUN: 7 mg/dL (ref 6–20)
CO2: 26 mmol/L (ref 22–32)
Calcium: 8.4 mg/dL — ABNORMAL LOW (ref 8.9–10.3)
Chloride: 102 mmol/L (ref 101–111)
Creatinine, Ser: 1.25 mg/dL — ABNORMAL HIGH (ref 0.61–1.24)
GFR calc Af Amer: >60 mL/min (ref >60)
GFR calc non Af Amer: >60 mL/min (ref >60)
GLUCOSE: 93 mg/dL (ref 65–99)
POTASSIUM: 4.4 mmol/L (ref 3.5–5.1)
Sodium: 135 mmol/L (ref 135–145)

## 2014-09-07 LAB — CBC
HEMATOCRIT: 31.2 % — AB (ref 39.0–52.0)
HEMOGLOBIN: 10.3 g/dL — AB (ref 13.0–17.0)
MCH: 27.4 pg (ref 26.0–34.0)
MCHC: 33 g/dL (ref 30.0–36.0)
MCV: 83 fL (ref 78.0–100.0)
Platelets: 411 10*3/uL — ABNORMAL HIGH (ref 150–400)
RBC: 3.76 MIL/uL — ABNORMAL LOW (ref 4.22–5.81)
RDW: 13.8 % (ref 11.5–15.5)
WBC: 10.5 10*3/uL (ref 4.0–10.5)

## 2014-09-07 LAB — CULTURE, BLOOD (ROUTINE X 2): Culture: NO GROWTH

## 2014-09-07 MED ORDER — OXYCODONE HCL 5 MG PO TABS
5.0000 mg | ORAL_TABLET | ORAL | Status: DC | PRN
  Administered 2014-09-07 – 2014-09-09 (×10): 10 mg via ORAL
  Filled 2014-09-07 (×10): qty 2

## 2014-09-07 MED ORDER — HYDROMORPHONE HCL 1 MG/ML IJ SOLN
1.0000 mg | INTRAMUSCULAR | Status: DC | PRN
  Administered 2014-09-07 – 2014-09-09 (×9): 1 mg via INTRAVENOUS
  Filled 2014-09-07 (×9): qty 1

## 2014-09-07 NOTE — Progress Notes (Signed)
Transferred to 6N per order per recliner tol well

## 2014-09-07 NOTE — Consult Note (Addendum)
WOC wound consult note Reason for Consult: Consult requested to apply Vac to abd wound.  Pt is familiar to [facility] nurse from previous admission. He has home negative pressure machine in the closet, but states home health nurses were never able to maintain a seal and it was not used.  Informed patient that he will be on a hospital Vac machine while at [facility] health, since the staff is unfamiliar with the other brand.  He can resume use of this product after discharge. Wound type: Abd midline full thickness post-op wound Measurement: 10X6.5X5.5cm Wound bed: 10% yellow, 90% beefy red Drainage (amount, consistency, odor) Small amt yellow drainage, no odor Periwound: Intact skin surrounding. Dressing procedure/placement/frequency:Applied one piece of black foam to abd with cont suction.  There is a deep fold located directly beneath the wound which is difficult to maintain the seal over; applied barrier ring then drape.  Pt medicated for pain prior to procedure and tolerated with mod amt discomfort.  Plan for dressing change on Wed.  WOC ostomy consult note Stoma type/location: Ileostomy to RLQ from previous admission.  Pt had teaching prior to this admission regarding pouching and emptying. Stomal assessment/size: Stoma red and viable, almost 2 inches around rod, flush with skin level, red rod in place underneath stoma.  This is creating a difficult pouching situation since the wafer can not be cut any larger and it is difficult to maintain seal around the tube.   Peristomal assessment:  Red and macerated to 2 cm surrounding stoma where previous pouch had been leaking. Treatment options for stomal/peristomal skin: Applied barrier ring to maintain seal.   Output: Mod amt green liquid stool Ostomy pouching: 2pc.  Education provided:  Bedside nurse applied 2 piece pouching system with barrier ring.  Please discontinue rod as soon as possible, since it is a very difficult pouching situation for the patient.  Supplies ordered to the bedside for staff nurse use.  Pt denies further questions at this time. Cammie Mcgee MSN, RN, CWOCN, [place], CNS [phone]

## 2014-09-07 NOTE — Progress Notes (Signed)
[facility] Progress Note     Subjective: Pt doing well.  Good appetite.  Pain well controlled except for when ambulating he's sore.  Good flatus and BM in bag.  JP drain has feculent foul smelling drainage.  158mL/24hr.    Objective: Vital signs in last 24 hours: Temp:  [98.2 F (36.8 C)-100 F (37.8 C)] 98.5 F (36.9 C) (08/22 0439) Pulse Rate:  [74-90] 78 (08/22 0439) Resp:  [18] 18 (08/22 0439) BP: (113-116)/(62-67) 116/67 mmHg (08/22 0439) SpO2:  [97 %-100 %] 100 % (08/22 0439) Last BM Date:  (ileostomy)  Intake/Output from previous day: 08/21 0701 - 08/22 0700 In: 720 [P.O.:720] Out: 1055 [Drains:105; Stool:950] Intake/Output this shift:    PE: Gen:  Alert, NAD, pleasant Abd: Soft, mild tenderness, ND, +BS, no HSM, midline wound clean with only sanguinous drainage, drain with browl feculent foul smelling drainage (143mL/24hr), ostomy pink with stool and flatus output.  Lab Results:   Recent Labs  09/06/14 0548 09/07/14 0420  WBC 9.6 10.5  HGB 10.5* 10.3*  HCT 32.2* 31.2*  PLT 435* 411*   BMET  Recent Labs  09/06/14 0548 09/07/14 0420  NA 136 135  K 3.7 4.4  CL 100* 102  CO2 26 26  GLUCOSE 92 93  BUN 7 7  CREATININE 1.30* 1.25*  CALCIUM 8.6* 8.4*   PT/INR No results for input(s): LABPROT, INR in the last 72 hours. CMP     Component Value Date/Time   NA 135 09/07/2014 0420   K 4.4 09/07/2014 0420   CL 102 09/07/2014 0420   CO2 26 09/07/2014 0420   GLUCOSE 93 09/07/2014 0420   BUN 7 09/07/2014 0420   CREATININE 1.25* 09/07/2014 0420   CALCIUM 8.4* 09/07/2014 0420   PROT 6.7 09/06/2014 0548   ALBUMIN 2.6* 09/06/2014 0548   AST 31 09/06/2014 0548   ALT 40 09/06/2014 0548   ALKPHOS 51 09/06/2014 0548   BILITOT 0.4 09/06/2014 0548   GFRNONAA >60 09/07/2014 0420   GFRAA >60 09/07/2014 0420   Lipase     Component Value Date/Time   LIPASE 27 08/21/2014 1008       Studies/Results: No results  found.  Anti-infectives: Anti-infectives    Start     Dose/Rate Route Frequency Ordered Stop   09/05/14 1100  ertapenem (INVANZ) 1 g in sodium chloride 0.9 % 50 mL IVPB     1 g 100 mL/hr over 30 Minutes Intravenous Every 24 hours 09/05/14 1016     09/02/14 2200  piperacillin-tazobactam (ZOSYN) IVPB 3.375 g  Status:  Discontinued     3.375 g 12.5 mL/hr over 240 Minutes Intravenous 3 times per day 09/02/14 2133 09/05/14 1016       Assessment/Plan Recurrent diverticulitis - failed conservative treatment x2 POD #12 S/p sigmoid colectomy with anastomosis, ileostomy, admitted with fever, small intra-abdominal abscesses -WBC normal, no more fevers. Switched from Zosyn to [employer] (Day #3). May need to be on cipro/flagyl instead of augmentin since he was sent home before on augmentin.  -On soft diet -Ambulate and IS -Continue JP drain, feculent foul smelling 156mL/24hr -Repeat CT tomorrow or Wednesday -Continue inpatient today. -Still has rubber tubing under ileostomy, can likely be removed soon -Has appt with Dr. Luisa Hart on 09/14/14 -Place home wound vac today     LOS: 5 days    Nonie Hoyer 09/07/2014, 9:12 AM Pager: [phone]

## 2014-09-08 ENCOUNTER — Inpatient Hospital Stay (HOSPITAL_COMMUNITY): Payer: BLUE CROSS/BLUE SHIELD

## 2014-09-08 LAB — CULTURE, BLOOD (ROUTINE X 2): Culture: NO GROWTH

## 2014-09-08 MED ORDER — IOHEXOL 300 MG/ML  SOLN
25.0000 mL | INTRAMUSCULAR | Status: AC
  Administered 2014-09-08 (×2): 25 mL via ORAL

## 2014-09-08 MED ORDER — IOHEXOL 300 MG/ML  SOLN
100.0000 mL | Freq: Once | INTRAMUSCULAR | Status: AC | PRN
  Administered 2014-09-08: 100 mL via INTRAVENOUS

## 2014-09-08 NOTE — Progress Notes (Signed)
[facility] Progress Note     Subjective: Pt doing okay, still has some pain.  Tolerating diet well and ambulating well.  No N/V.  Drain still with stool drainage.  Objective: Vital signs in last 24 hours: Temp:  [98.4 F (36.9 C)-98.5 F (36.9 C)] 98.4 F (36.9 C) (08/23 0722) Pulse Rate:  [77-79] 77 (08/23 0722) Resp:  [18] 18 (08/23 0722) BP: (108-128)/(63-84) 108/63 mmHg (08/23 0722) SpO2:  [94 %-98 %] 94 % (08/23 0722) Last BM Date: 09/07/14  Intake/Output from previous day: 08/22 0701 - 08/23 0700 In: 3212 [P.O.:720; I.V.:2492] Out: 450 [Drains:70; Stool:380] Intake/Output this shift:    PE: Gen:  Alert, NAD, pleasant Abd: Soft, mild tenderness, ND, +BS, no HSM, JP drain with brown foul smelling stool drainage (only 61mL/24hr) midline wound with wound vac in place, ostomy pink and patent with liquid brown stool and flatus.   Lab Results:   Recent Labs  09/06/14 0548 09/07/14 0420  WBC 9.6 10.5  HGB 10.5* 10.3*  HCT 32.2* 31.2*  PLT 435* 411*   BMET  Recent Labs  09/06/14 0548 09/07/14 0420  NA 136 135  K 3.7 4.4  CL 100* 102  CO2 26 26  GLUCOSE 92 93  BUN 7 7  CREATININE 1.30* 1.25*  CALCIUM 8.6* 8.4*   PT/INR No results for input(s): LABPROT, INR in the last 72 hours. CMP     Component Value Date/Time   NA 135 09/07/2014 0420   K 4.4 09/07/2014 0420   CL 102 09/07/2014 0420   CO2 26 09/07/2014 0420   GLUCOSE 93 09/07/2014 0420   BUN 7 09/07/2014 0420   CREATININE 1.25* 09/07/2014 0420   CALCIUM 8.4* 09/07/2014 0420   PROT 6.7 09/06/2014 0548   ALBUMIN 2.6* 09/06/2014 0548   AST 31 09/06/2014 0548   ALT 40 09/06/2014 0548   ALKPHOS 51 09/06/2014 0548   BILITOT 0.4 09/06/2014 0548   GFRNONAA >60 09/07/2014 0420   GFRAA >60 09/07/2014 0420   Lipase     Component Value Date/Time   LIPASE 27 08/21/2014 1008       Studies/Results: No results found.  Anti-infectives: Anti-infectives    Start     Dose/Rate Route  Frequency Ordered Stop   09/05/14 1100  ertapenem (INVANZ) 1 g in sodium chloride 0.9 % 50 mL IVPB     1 g 100 mL/hr over 30 Minutes Intravenous Every 24 hours 09/05/14 1016     09/02/14 2200  piperacillin-tazobactam (ZOSYN) IVPB 3.375 g  Status:  Discontinued     3.375 g 12.5 mL/hr over 240 Minutes Intravenous 3 times per day 09/02/14 2133 09/05/14 1016       Assessment/Plan Recurrent diverticulitis - failed conservative treatment x2 POD #13 S/p sigmoid colectomy with anastomosis, ileostomy, admitted with fever, small intra-abdominal abscesses -WBC normal, no more fevers. Switched from Zosyn to [employer] (Day #4). May need to be on cipro/flagyl instead of augmentin since he was sent home before on augmentin.  -On soft diet -Ambulate and IS -Continue JP drain, feculent foul smelling 22mL/24hr -Repeat CT today -Still has rubber tubing under ileostomy, removed rubber tubing bridge today and a remaining staple. -Has appt with Dr. Luisa Hart on 09/14/14 -Wound vac placed, MWF changes    LOS: 6 days    Nonie Hoyer 09/08/2014, 9:01 AM Pager: [phone]

## 2014-09-09 LAB — CBC
HEMATOCRIT: 32.3 % — AB (ref 39.0–52.0)
HEMOGLOBIN: 10.5 g/dL — AB (ref 13.0–17.0)
MCH: 26.6 pg (ref 26.0–34.0)
MCHC: 32.5 g/dL (ref 30.0–36.0)
MCV: 82 fL (ref 78.0–100.0)
Platelets: 430 10*3/uL — ABNORMAL HIGH (ref 150–400)
RBC: 3.94 MIL/uL — ABNORMAL LOW (ref 4.22–5.81)
RDW: 13.9 % (ref 11.5–15.5)
WBC: 10 10*3/uL (ref 4.0–10.5)

## 2014-09-09 LAB — CREATININE, SERUM
Creatinine, Ser: 1.39 mg/dL — ABNORMAL HIGH (ref 0.61–1.24)
GFR calc non Af Amer: 59 mL/min — ABNORMAL LOW (ref >60)

## 2014-09-09 MED ORDER — METHOCARBAMOL 500 MG PO TABS: 1000.0000 mg | ORAL_TABLET | Freq: Three times a day (TID) | ORAL | Status: DC | PRN

## 2014-09-09 MED ORDER — SODIUM CHLORIDE 0.9 % IJ SOLN: 10.0000 mL | INTRAMUSCULAR | Status: DC | PRN

## 2014-09-09 MED ORDER — SODIUM CHLORIDE 0.9 % IV SOLN: 1.0000 g | INTRAVENOUS | Status: DC

## 2014-09-09 MED ORDER — OXYCODONE HCL 5 MG PO TABS: 5.0000 mg | ORAL_TABLET | Freq: Four times a day (QID) | ORAL | Status: DC | PRN

## 2014-09-09 NOTE — Discharge Instructions (Signed)
[facility]      [facility] °[phone] ° °OPEN ABDOMINAL SURGERY: POST OP INSTRUCTIONS ° °Always review your discharge instruction sheet given to you by the facility where your surgery was performed. ° °IF YOU HAVE DISABILITY OR FAMILY LEAVE FORMS, YOU MUST BRING THEM TO THE OFFICE FOR PROCESSING.  PLEASE DO NOT GIVE THEM TO YOUR DOCTOR. ° °1. A prescription for pain medication may be given to you upon discharge.  Take your pain medication as prescribed, if needed.  If narcotic pain medicine is not needed, then you may take acetaminophen (Tylenol) or ibuprofen (Advil) as needed. °2. Take your usually prescribed medications unless otherwise directed. °3. If you need a refill on your pain medication, please contact your pharmacy. They will contact our office to request authorization.  Prescriptions will not be filled after 5pm or on week-ends. °4. You should follow a light diet the first few days after arrival home, such as soup and crackers, pudding, etc.unless your doctor has advised otherwise. A high-fiber, low fat diet can be resumed as tolerated.   Be sure to include lots of fluids daily. Most patients will experience some swelling and bruising on the chest and neck area.  Ice packs will help.  Swelling and bruising can take several days to resolve °5. Most patients will experience some swelling and bruising in the area of the incision. Ice pack will help. Swelling and bruising can take several days to resolve..  °6. It is common to experience some constipation if taking pain medication after surgery.  Increasing fluid intake and taking a stool softener will usually help or prevent this problem from occurring.  A mild laxative (Milk of Magnesia or Miralax) should be taken according to package directions if there are no bowel movements after 48 hours. °7.  You may have steri-strips (small skin tapes) in place directly over the incision.  These strips should be left on the skin for 7-10 days.  If your  surgeon used skin glue on the incision, you may shower in 24 hours.  The glue will flake off over the next 2-3 weeks.  Any sutures or staples will be removed at the office during your follow-up visit. You may find that a light gauze bandage over your incision may keep your staples from being rubbed or pulled. You may shower and replace the bandage daily. °8. ACTIVITIES:  You may resume regular (light) daily activities beginning the next day--such as daily self-care, walking, climbing stairs--gradually increasing activities as tolerated.  You may have sexual intercourse when it is comfortable.  Refrain from any heavy lifting or straining until approved by your doctor. °a. You may drive when you no longer are taking prescription pain medication, you can comfortably wear a seatbelt, and you can safely maneuver your car and apply brakes °b. Return to Work: ___________________________________ °9. You should see your doctor in the office for a follow-up appointment approximately two weeks after your surgery.  Make sure that you call for this appointment within a day or two after you arrive home to insure a convenient appointment time. °OTHER INSTRUCTIONS:  °_____________________________________________________________ °_____________________________________________________________ ° °WHEN TO CALL YOUR DOCTOR: °1. Fever over 101.0 °2. Inability to urinate °3. Nausea and/or vomiting °4. Extreme swelling or bruising °5. Continued bleeding from incision. °6. Increased pain, redness, or drainage from the incision. °7. Difficulty swallowing or breathing °8. Muscle cramping or spasms. °9. Numbness or tingling in hands or feet or around lips. ° °The clinic staff is available to   answer your questions during regular business hours.  Please dont hesitate to call and ask to speak to one of the nurses if you have concerns.  For further questions, please visit [email or web address]  Low-Fiber Diet Fiber is found in  fruits, vegetables, and whole grains. A low-fiber diet restricts fibrous foods that are not digested in the small intestine. A diet containing about 10-15 grams of fiber per day is considered low fiber. Low-fiber diets may be used to:  Promote healing and rest the bowel during intestinal flare-ups.  Prevent blockage of a partially obstructed or narrowed gastrointestinal tract.  Reduce fecal weight and volume.  Slow the movement of feces. You may be on a low-fiber diet as a transitional diet following surgery, after an injury (trauma), or because of a short (acute) or lifelong (chronic) illness. Your health care provider will determine the length of time you need to stay on this diet.  WHAT DO I NEED TO KNOW ABOUT A LOW-FIBER DIET? Always check the fiber content on the packaging's Nutrition Facts label, especially on foods from the grains list. Ask your dietitian if you have questions about specific foods that are related to your condition, especially if the food is not listed below. In general, a low-fiber food will have less than 2 g of fiber. WHAT FOODS CAN I EAT? Grains All breads and crackers made with white flour. Sweet rolls, doughnuts, waffles, pancakes, [place] toast, bagels. Pretzels, Melba toast, zwieback. Well-cooked cereals, such as cornmeal, farina, or cream cereals. Dry cereals that do not contain whole grains, fruit, or nuts, such as refined corn, wheat, rice, and oat cereals. Potatoes prepared any way without skins, plain pastas and noodles, refined white rice. Use white flour for baking and making sauces. Use allowed list of grains for casseroles, dumplings, and puddings.  Vegetables Strained tomato and vegetable juices. Fresh lettuce, cucumber, spinach. Well-cooked (no skin or pulp) or canned vegetables, such as asparagus, bean sprouts, beets, carrots, green beans, mushrooms, potatoes, pumpkin, spinach, yellow squash, tomato sauce/puree, turnips, yams, and zucchini. Keep servings  limited to  cup.  Fruits All fruit juices except prune juice. Cooked or canned fruits without skin and seeds, such as applesauce, apricots, cherries, fruit cocktail, grapefruit, grapes, mandarin oranges, melons, peaches, pears, pineapple, and plums. Fresh fruits without skin, such as apricots, avocados, bananas, melons, pineapple, nectarines, and peaches. Keep servings limited to  cup or 1 piece.  Meat and Other Protein Sources Ground or well-cooked tender beef, ham, veal, lamb, pork, or poultry. Eggs, plain cheese. Fish, oysters, shrimp, lobster, and other seafood. Liver, organ meats. Smooth nut butters. Dairy All milk products and alternative dairy substitutes, such as soy, rice, almond, and coconut, not containing added whole nuts, seeds, or added fruit. Beverages Decaf coffee, fruit, and vegetable juices or smoothies (small amounts, with no pulp or skins, and with fruits from allowed list), sports drinks, herbal tea. Condiments Ketchup, mustard, vinegar, cream sauce, cheese sauce, cocoa powder. Spices in moderation, such as allspice, basil, bay leaves, celery powder or leaves, cinnamon, cumin powder, curry powder, ginger, mace, marjoram, onion or garlic powder, oregano, paprika, parsley flakes, ground pepper, rosemary, sage, savory, tarragon, thyme, and turmeric. Sweets and Desserts Plain cakes and cookies, pie made with allowed fruit, pudding, custard, cream pie. Gelatin, fruit, ice, sherbet, frozen ice pops. Ice cream, ice milk without nuts. Plain hard candy, honey, jelly, molasses, syrup, sugar, chocolate syrup, gumdrops, marshmallows. Limit overall sugar intake.  Fats and Oil Margarine, butter, cream, mayonnaise, salad oils, plain salad  dressings made from allowed foods. Choose healthy fats such as olive oil, canola oil, and omega-3 fatty acids (such as found in salmon or tuna) when possible.  Other Bouillon, broth, or cream soups made from allowed foods. Any strained soup. Casseroles or  mixed dishes made with allowed foods. The items listed above may not be a complete list of recommended foods or beverages. Contact your dietitian for more options.  WHAT FOODS ARE NOT RECOMMENDED? Grains All whole wheat and whole grain breads and crackers. Multigrains, rye, bran seeds, nuts, or coconut. Cereals containing whole grains, multigrains, bran, coconut, nuts, raisins. Cooked or dry oatmeal, steel-cut oats. Coarse wheat cereals, granola. Cereals advertised as high fiber. Potato skins. Whole grain pasta, wild or brown rice. Popcorn. Coconut flour. Bran, buckwheat, corn bread, multigrains, rye, wheat germ.  Vegetables Fresh, cooked or canned vegetables, such as artichokes, asparagus, beet greens, broccoli, Brussels sprouts, cabbage, celery, cauliflower, corn, eggplant, kale, legumes or beans, okra, peas, and tomatoes. Avoid large servings of any vegetables, especially raw vegetables.  Fruits Fresh fruits, such as apples with or without skin, berries, cherries, figs, grapes, grapefruit, guavas, kiwis, mangoes, oranges, papayas, pears, persimmons, pineapple, and pomegranate. Prune juice and juices with pulp, stewed or dried prunes. Dried fruits, dates, raisins. Fruit seeds or skins. Avoid large servings of all fresh fruits. Meats and Other Protein Sources Tough, fibrous meats with gristle. Chunky nut butter. Cheese made with seeds, nuts, or other foods not recommended. Nuts, seeds, legumes (beans, including baked beans), dried peas, beans, lentils.  Dairy Yogurt or cheese that contains nuts, seeds, or added fruit.  Beverages Fruit juices with high pulp, prune juice. Caffeinated coffee and teas.  Condiments Coconut, maple syrup, pickles, olives. Sweets and Desserts Desserts, cookies, or candies that contain nuts or coconut, chunky peanut butter, dried fruits. Jams, preserves with seeds, marmalade. Large amounts of sugar and sweets. Any other dessert made with fruits from the not recommended  list.  Other Soups made from vegetables that are not recommended or that contain other foods not recommended.  The items listed above may not be a complete list of foods and beverages to avoid. Contact your dietitian for more information. Document Released: 06/24/2001 Document Revised: 01/07/2013 Document Reviewed: 11/25/2012 [facility] Patient Information 2015 [place], [place]. This information is not intended to replace advice given to you by your health care provider. Make sure you discuss any questions you have with your health care provider.  Ileostomy Surgery, Care After Refer to this sheet in the next few weeks. These instructions provide you with information on caring for yourself after your procedure. Your caregiver may also give you more specific instructions. Your treatment has been planned according to current medical practices, but problems sometimes occur. Call your caregiver if you have any problems or questions after your procedure. HOME CARE INSTRUCTIONS  Rest. Give yourself time to adjust to having the external pouch if you have one. Give your surgical cuts (incisions) and new pouch time to heal.  Avoid strenuous activity, heavy lifting, and abdominal exercises for up to 10 weeks. After that, check with your caregiver.  Take pain medicine or other medicines as directed. Your caregiver may recommend certain medicines for the relief of constipation or diarrhea.  You may shower with or without the pouch.  Wear any clothing that is comfortable.  Follow your caregiver's instructions to protect the skin around the stoma.  Change your pouch as often as needed. It may need to be changed more often right after surgery.  Follow your caregiver's dietary instructions. Your caregiver will help you understand which foods may cause blockage, increase bowel movements, slow bowel movements, cause skin irritation, or cause gas.  You may gradually resume most activities, including sexual  activity, as directed. Ask your caregiver about becoming pregnant and about using birth control. Medicines may not be absorbed normally after your procedure.  Always discuss your medicines with your caregiver before taking them.  You may travel. Be sure to pack plenty of ileostomy supplies.  If you smoke, quit. Smoking slows the healing process. SEEK MEDICAL CARE IF:  You are having trouble caring for your stoma or using the ostomy supplies (changing the pouch).  You feel nauseous.  You vomit.  You notice bleeding, skin irritation, drainage, bulging, redness of the wounds, or pain around the anus or stoma.  You notice a change in the size or appearance of the stoma.  You have abdominal pain, bloating, pressure, or cramping.  Your stools do not become firmer.  Your stool frequency is more or less often than expected.  You experience sexual dysfunction.  You experience shortness of breath, fatigue, thirst, dry mouth, or unusual sensations in the limbs.  You have other new symptoms.  You have questions or concerns. SEEK IMMEDIATE MEDICAL CARE IF:  Your abdominal pain does not go away or it becomes severe.  You feel dizzy, lightheaded, or faint.  You measure pouch drainage of more than 1,500 ml per day. This amount of drainage can lead to dehydration.  You have a fever.  You keep vomiting.  Your stool is not draining through the stoma.  You have an irregular heartbeat or chest pain. MAKE SURE YOU:  Understand these instructions.  Will watch your condition.  Will get help right away if you are not doing well or get worse. Document Released: 08/15/2010 Document Revised: 01/07/2013 Document Reviewed: 08/15/2010 [facility] Patient Information 2015 [place], [place]. This information is not intended to replace advice given to you by your health care provider. Make sure you discuss any questions you have with your health care provider.  Vacuum-Assisted Closure  Therapy Vacuum-assisted closure (VAC) therapy uses a device that removes fluid and germs from wounds to help them heal. It is used on wounds that cannot be closed with stitches. They often heal slowly. Vacuum-assisted therapy helps the wound stay clean and healthy while the open wound slowly grows back together. Vacuum-assisted closure therapy uses a bandage (dressing) that is made of foam. It is put inside the wound. Then, a drape is placed over the wound. This drape sticks to your skin to keep air out, and to protect the wound. A tube is hooked up to a small pump and is attached to the drape. The pump sucks out the fluid and germs. Vacuum-assisted closure therapy can also help reduce the bad smell that comes from the wound. HOW DOES IT WORK?  The vacuum pump pulls fluid through the foam dressing. The dressing may wrinkle during this process. The fluid goes into the tube and away from the wound. The fluid then goes into a container. The fluid in the container must be replaced if it is full or at least once a week, even if the container is not full. The pulling from the pump helps to close the wound and bring better circulation to the wound area. The foam dressing covers and protects the wound. It helps your wound heal faster.  HOW DOES IT FEEL?   You might feel a little pulling when  the pump is on.  You might also feel a mild vibrating sensation.  You might feel some discomfort when the dressing is taken off. CAN I MOVE AROUND WITH VACUUM-ASSISTED CLOSURE THERAPY? Yes, it has a backup battery which is used when the machine is not plugged in, as long as the battery is working, you can move freely. WHAT ARE SOME THINGS I MUST KNOW?  Do not turn off the pump yourself, unless instructed to do so by your healthcare provider, such as for bathing.  Do not take off the dressing yourself, unless instructed to do so by your caregiver.  You can wash or shower with the dressing. However, do not take the  pump into the shower. Make sure the wound dressing is protected and covered with plastic. The wound area must stay dry.  Do not turn off the pump for more than 2 hours. If the pump is off for more than 2 hours, your nurse must change your dressing.  Check frequently that the machine is on, that the machine indicates the therapy is on, and that all clamps are open. THE ALARM IS SOUNDING! WHAT SHOULD I DO?   Stay calm.  Do not turn off the pump or do anything with the dressing.  Call your clinic or caregiver right away if the alarm goes off and you cannot fix the problem. Some reasons the alarm might go off include:  The fluid collection container is full.  The battery is low.  The dressing has a leak.  Explain to your caregiver what is happening. Follow the instructions you receive. WHEN SHOULD I CALL FOR HELP?   You have severe pain.  You have difficulty breathing.  You have bleeding that will not stop.  Your wound smells bad.  You have redness, swelling, or fluid leaking from your wound.  Your alarm goes off and you do not know what to do.  You have a fever.  Your wound itches severely.  Your dressing changes are often painful or bleeding often occurs.  You have diarrhea.  You have a sore throat.  You have a rash around the dressing or anywhere else on your body.  You feel nauseous.  You feel dizzy or weak.  The [facility] machine has been off for more than 2 hours. HOW DO I GET READY TO GO HOME WITH A PUMP?  A trained caregiver will talk to you and answer your questions about your vacuum-assisted closure therapy before you go home. He or she will explain what to expect. A caregiver will come to your home to apply the pump and care for your wound. The at-home caregiver will be available for questions and will come back for the scheduled dressing changes, usually every 48-72 hours (or more often for severely infected wounds). Your at-home caregiver will also come if you  are having an unexpected problem. If you have questions or do not know what to do when you go home, talk to your healthcare provider. Document Released: 12/16/2007 Document Revised: 09/04/2012 Document Reviewed: 12/16/2010 [facility] Patient Information 2015 [place], [place]. This information is not intended to replace advice given to you by your health care provider. Make sure you discuss any questions you have with your health care provider.

## 2014-09-09 NOTE — Progress Notes (Signed)
Discharge home. Home discharge instruction given, no question verbalized. HHN needs addressed by case mangement.

## 2014-09-09 NOTE — Discharge Summary (Signed)
[facility] Discharge Summary   Patient ID: Bryan [name] MRN: [id number] DOB/AGE: 04/23/1970 44 y.o.  Admit date: 09/02/2014 Discharge date: 09/09/2014  Admitting Diagnosis: Post-operative intra-abdominal abscesses Fever Leukocytosis S/p colectomy with diverting ileostomy  Discharge Diagnosis Patient Active Problem List   Diagnosis Date Noted  . Diverticulitis 09/02/2014  . Diverticulitis of colon with perforation 08/21/2014  . Acute diverticulitis 07/30/2014  . Traumatic hematoma of thigh 03/19/2012    Consultants None  Imaging: Ct Abdomen Pelvis W Contrast  09/08/2014   CLINICAL DATA:  Patient with JP drain in place. Evaluate abdominal abscess.  EXAM: CT ABDOMEN AND PELVIS WITH CONTRAST  TECHNIQUE: Multidetector CT imaging of the abdomen and pelvis was performed using the standard protocol following bolus administration of intravenous contrast.  CONTRAST:  OMNIPAQUE IOHEXOL 300 MG/ML  SOLN  COMPARISON:  CT abdomen pelvis 09/02/2014  FINDINGS: Lower chest: Normal heart size. Dependent atelectasis left lower lobe.  Hepatobiliary: The liver is normal in size and contour without focal hepatic lesion identified. Gallbladder is unremarkable. No intrahepatic or extrahepatic biliary ductal dilatation.  Pancreas: Unremarkable  Spleen: Unremarkable  Adrenals/Urinary Tract: Nonobstructing stone interpolar region right kidney. Normal adrenal glands. Urinary bladder is unremarkable. No hydronephrosis.  Stomach/Bowel: Re- demonstrated circumferential wall thickening of sigmoid colon. Right lower quadrant ileostomy is present.  Vascular/Lymphatic: Normal caliber abdominal aorta. Multiple sub cm retroperitoneal lymph nodes, stable.  Other: Percutaneous drainage catheter courses through the left anterior abdominal wall and terminates within the left lower quadrant. There has been interval decrease in size of small amount of fluid within the anterior abdomen (1.3 cm in  thickness, previously 1.8 cm in thickness) (image 77; series 201) and along the left paracolic gutter (0.8 cm in thickness previously 1.8 cm in thickness) (image 69; series 201). There is persistent extensive mesenteric fat stranding. Near complete resolution of previously visualized free intraperitoneal air with a few tiny foci centrally within the mesentery.  Musculoskeletal: No aggressive or acute appearing osseous lesions. Lower thoracic and lumbar spine degenerative changes. Ventral abdominal wall incision. No significant interval change in benign-appearing lucent lesion within the left supra-acetabular region and intertrochanteric left femur.  IMPRESSION: Slight interval improvement previously described in complicated fluid collections within the anterior abdomen and left paracolic gutter. There is persistent mesenteric fat stranding within the left lower quadrant. Drainage catheter in place. There a few tiny residual foci of gas within the central mesentery.   Electronically Signed   By: Annia Belt M.D.   On: 09/08/2014 19:36    Procedures None this admission  Hospital Course:  44 y/o white male s/p sigmoid colectomy with primary anastomosis and ileostomy for recurrent sigmoid diverticulitis which failed medical management x2.  He was discharged home two days ago presents with new fevers/chills.  Also states has had some bleeding from stoma. He underwent CT with collections near anastomosis despite being on Augmentin at home.  This was concerning for a small leak at the anastomosis.  He had a JP drain in place. He was pending wound vac placement on the day of admission, but came to hospital due to concerns from the [facility] nurse.  Patient was admitted and was transferred to the floor for medical management of his intra-abdominal infections.  He was restarted on Zosyn.  His JP drain was found to have frank stool drainage with foul smell.  Eventually the Zosyn was switched to Invanz.  As pain, fever, and  leukocytosis improved his diet was advanced as tolerated.  We repeated  a CT scan on 09/08/14 which showed slight interval improvement in fluid collections in the anterior abdomen and left paracolic gutter.  Mesenteric fat stranding in LLQ, and a few tiny dots of gas within the central mesentery.  JP drainage remains at 77mL/24hr today, but it will be continued.  We removed the ileostomy bridge on 09/08/14.  On HD #8, the patient was voiding well, tolerating diet, ambulating well, pain well controlled, vital signs stable, midline wound clean and felt stable for discharge home.  After discussion with Dr. Carolynne Edouard and Dr. Luisa Hart we will place a PICC prior to discharge and d/c home with 2 weeks of IV Invanz and plan for repeat CT in 1-2 weeks.  Patient will follow up in our office with Dr. Luisa Hart on Monday as scheduled and knows to call with questions or concerns.     Physical Exam: General:  Alert, NAD, pleasant, comfortable Abd: Soft, mild tenderness, ND, +BS, no HSM, JP drain with tan foul smelling drainage (1mL/24hr) midline wound with wound vac in place with only serosanguinous drainage (69mL/24hr), ostomy pink and patent with liquid brown stool and flatus (removed bridge on 09/08/14).     Medication List    STOP taking these medications        amoxicillin-clavulanate 875-125 MG per tablet  Commonly known as:  AUGMENTIN      TAKE these medications        acetaminophen 325 MG tablet  Commonly known as:  TYLENOL  Take 2 tablets (650 mg total) by mouth every 6 (six) hours as needed for mild pain (or temp > 100).     ertapenem 1 g in sodium chloride 0.9 % 50 mL  Inject 1 g into the vein daily.     methocarbamol 500 MG tablet  Commonly known as:  ROBAXIN  Take 2 tablets (1,000 mg total) by mouth every 8 (eight) hours as needed for muscle spasms (or pain).     oxyCODONE 5 MG immediate release tablet  Commonly known as:  Oxy IR/ROXICODONE  Take 1-3 tablets (5-15 mg total) by mouth every 6 (six)  hours as needed for moderate pain, severe pain or breakthrough pain.         Follow-up Information    Go to CORNETT,THOMAS A., MD.   Specialty:  General Surgery   Why:  Go to your already scheduled appointment with Dr. Luisa Hart.  Please arrive 30 min early to check in and fill out paperwork.  Call to check appointment date/time if needed.   Contact information:   [address] [place] [address] [phone]       Signed: Bradd Canary [facility] [phone]  09/09/2014, 8:13 AM

## 2014-09-09 NOTE — Consult Note (Addendum)
WOC wound consult note Reason for Consult:  Vac dressing changed.  Home health personnel at bedside to assist with changing Vac dressing to [facility] product.  Home pump at bedside and applied Medella dressing and pump to wound at cont suction.  One piece black foam applied and patient tolerated with mod amt discomfort.  Wound is beefy red with minimal amt red drainage, no odor.  CCS PA in at bedside to assess wound while dressing was off.  Applied piece of barrier ring around the bottom of the wound to maintain seal. Pt will discharge on the Medella pump and will have home health assistance for abd dressing changes and ostomy care.  WOC ostomy consult note Ileostomy pouch was applied yesterday by the bedside nurse; it is intact with a good seal.  Emptied 50cc liquid green stool.  Pt is able to open and close the velcro to empty.  Rod was removed from stoma yesterday according to EMR progress notes.  Pt denies further questions regarding ostomy care at this time. Please re-consult if further assistance is needed.  Thank-you,  Cammie Mcgee MSN, RN, CWOCN, [place], CNS [phone]

## 2014-09-09 NOTE — Progress Notes (Signed)
Bryan [name] has been a pt of [facility] and this will continue after his discharge today as they will manage his IV antibiotic infusions and Wound Vac. He is currently having his PICC placed and should discharge to home later today. No further CM needs at this time. Will sign off for now. Yvone Neu, RNCM [phone]

## 2014-09-09 NOTE — Progress Notes (Signed)
Peripherally Inserted Central Catheter/Midline Placement  The IV Nurse has discussed with the patient and/or persons authorized to consent for the patient, the purpose of this procedure and the potential benefits and risks involved with this procedure.  The benefits include less needle sticks, lab draws from the catheter and patient may be discharged home with the catheter.  Risks include, but not limited to, infection, bleeding, blood clot (thrombus formation), and puncture of an artery; nerve damage and irregular heat beat.  Alternatives to this procedure were also discussed.  PICC/Midline Placement Documentation  PICC / Midline Single Lumen 09/09/14 PICC Right Basilic 40 cm 0 cm (Active)  Indication for Insertion or Continuance of Line Home intravenous therapies (PICC only) 09/09/2014  2:00 PM  Exposed Catheter (cm) 0 cm 09/09/2014  2:00 PM  Dressing Change Due 09/16/14 09/09/2014  2:00 PM       Bryan [name] 09/09/2014, 2:53 PM

## 2014-09-21 ENCOUNTER — Other Ambulatory Visit (HOSPITAL_COMMUNITY)
Admission: AD | Admit: 2014-09-21 | Discharge: 2014-09-21 | Disposition: A | Discharge status: Home / Self Care | Payer: BLUE CROSS/BLUE SHIELD | Source: Other Acute Inpatient Hospital | Attending: Surgery | Admitting: Surgery

## 2014-09-21 DIAGNOSIS — Z48815 Encounter for surgical aftercare following surgery on the digestive system: Secondary | ICD-10-CM | POA: Insufficient documentation

## 2014-09-21 DIAGNOSIS — K5732 Diverticulitis of large intestine without perforation or abscess without bleeding: Secondary | ICD-10-CM | POA: Diagnosis present

## 2014-09-21 LAB — CBC WITH DIFFERENTIAL/PLATELET
BASOS ABS: 0.1 10*3/uL (ref 0.0–0.1)
Basophils Relative: 1 % (ref 0–1)
EOS PCT: 2 % (ref 0–5)
Eosinophils Absolute: 0.2 10*3/uL (ref 0.0–0.7)
HEMATOCRIT: 36.5 % — AB (ref 39.0–52.0)
HEMOGLOBIN: 12.3 g/dL — AB (ref 13.0–17.0)
LYMPHS ABS: 2 10*3/uL (ref 0.7–4.0)
LYMPHS PCT: 14 % (ref 12–46)
MCH: 27.5 pg (ref 26.0–34.0)
MCHC: 33.7 g/dL (ref 30.0–36.0)
MCV: 81.5 fL (ref 78.0–100.0)
Monocytes Absolute: 0.7 10*3/uL (ref 0.1–1.0)
Monocytes Relative: 5 % (ref 3–12)
NEUTROS ABS: 10.9 10*3/uL — AB (ref 1.7–7.7)
NEUTROS PCT: 78 % — AB (ref 43–77)
PLATELETS: 336 10*3/uL (ref 150–400)
RBC: 4.48 MIL/uL (ref 4.22–5.81)
RDW: 14.2 % (ref 11.5–15.5)
WBC: 13.8 10*3/uL — AB (ref 4.0–10.5)

## 2014-09-21 LAB — BUN: BUN: 29 mg/dL — ABNORMAL HIGH (ref 6–20)

## 2014-09-21 LAB — CREATININE, SERUM
Creatinine, Ser: 1.82 mg/dL — ABNORMAL HIGH (ref 0.61–1.24)
GFR, EST AFRICAN AMERICAN: 50 mL/min — AB (ref >60)
GFR, EST NON AFRICAN AMERICAN: 43 mL/min — AB (ref >60)

## 2014-10-03 ENCOUNTER — Encounter (HOSPITAL_COMMUNITY): Payer: Self-pay | Admitting: Nurse Practitioner

## 2014-10-03 ENCOUNTER — Emergency Department (HOSPITAL_COMMUNITY)
Admission: EM | Admit: 2014-10-03 | Discharge: 2014-10-04 | Disposition: A | Discharge status: Home / Self Care | Payer: BLUE CROSS/BLUE SHIELD | Attending: Emergency Medicine | Admitting: Emergency Medicine

## 2014-10-03 DIAGNOSIS — B379 Candidiasis, unspecified: Secondary | ICD-10-CM | POA: Insufficient documentation

## 2014-10-03 DIAGNOSIS — Z79899 Other long term (current) drug therapy: Secondary | ICD-10-CM | POA: Insufficient documentation

## 2014-10-03 DIAGNOSIS — Z8719 Personal history of other diseases of the digestive system: Secondary | ICD-10-CM | POA: Insufficient documentation

## 2014-10-03 DIAGNOSIS — Z87828 Personal history of other (healed) physical injury and trauma: Secondary | ICD-10-CM | POA: Insufficient documentation

## 2014-10-03 DIAGNOSIS — K9403 Colostomy malfunction: Secondary | ICD-10-CM

## 2014-10-03 DIAGNOSIS — Z8701 Personal history of pneumonia (recurrent): Secondary | ICD-10-CM | POA: Insufficient documentation

## 2014-10-03 DIAGNOSIS — L988 Other specified disorders of the skin and subcutaneous tissue: Secondary | ICD-10-CM | POA: Diagnosis present

## 2014-10-03 MED ORDER — HYDROMORPHONE HCL 1 MG/ML IJ SOLN
1.0000 mg | Freq: Once | INTRAMUSCULAR | Status: AC
  Administered 2014-10-03: 1 mg via INTRAMUSCULAR
  Filled 2014-10-03: qty 1

## 2014-10-03 MED ORDER — CLOTRIMAZOLE 1 % EX CREA
TOPICAL_CREAM | Freq: Two times a day (BID) | CUTANEOUS | Status: DC
  Administered 2014-10-03: via TOPICAL
  Filled 2014-10-03: qty 15

## 2014-10-03 NOTE — ED Notes (Signed)
RN from upstairs came down to assist this RN in assessment and wound care on the ostomy.  Recommendations made and additional pieces ordered from materials.  MD made aware of recommendations and wound status.

## 2014-10-03 NOTE — ED Notes (Signed)
He had colostomy surgery on 8/10. He just had wound vac removed Friday, then Monday he began to have yeast around the colostomy site. Home health nurse has been treating yeast with an antifungal powder but the yeast is getting worse, painful and now the colostomy bag will not stay attacvhed and fluid is leaking out around it

## 2014-10-03 NOTE — ED Provider Notes (Signed)
CSN: [id number]     Arrival date & time 10/03/14  1822 History   First MD Initiated Contact with Patient 10/03/14 2048     Chief Complaint  Patient presents with  . Skin Problem     (Consider location/radiation/quality/duration/timing/severity/associated sxs/prior Treatment) HPI He had colostomy surgery on 8/10. He just had wound vac removed Friday, then Monday he began to have yeast around the colostomy site. Home health nurse has been treating yeast with an antifungal powder but the yeast is getting worse, painful and now the colostomy bag will not stay attacvhed and fluid is leaking out around it. They have tried placing cloths and anti-fungal powder without any relief of the symptoms. He does not have other intra-abdominal pain or fever. Past Medical History  Diagnosis Date  . Pneumonia 1990  . Diverticulitis of colon with perforation 07/30/2014    acute diverticulititis with microperforation  . Thigh hematoma 2013   Past Surgical History  Procedure Laterality Date  . Appendectomy  1981  . Laparoscopic partial colectomy N/A 08/26/2014    Procedure: LAPAROSCOPIC ASSISTED PARTIAL COLECTOMY WITH OSTOMY;  Surgeon: Harriette Bouillon, MD;  Location: [facility] OR;  Service: General;  Laterality: N/A;  . Colon surgery     Family History  Problem Relation Age of Onset  . Cancer Father     lung   Social History  Substance Use Topics  . Smoking status: Never Smoker   . Smokeless tobacco: Never Used  . Alcohol Use: 0.6 oz/week    1 Cans of beer per week    Review of Systems Constitutional: No fever no chills GI: No nausea vomiting or abdominal pain aside from skin rash.   Allergies  Review of patient's allergies indicates no known allergies.  Home Medications   Prior to Admission medications   Medication Sig Start Date End Date Taking? Authorizing Provider  acetaminophen (TYLENOL) 325 MG tablet Take 2 tablets (650 mg total) by mouth every 6 (six) hours as needed for mild pain (or temp  > 100). 08/31/14   Nonie Hoyer, PA-C  clotrimazole (LOTRIMIN) 1 % cream Apply to affected area 2 times daily 10/04/14   Arby Barrette, MD  ertapenem 1 g in sodium chloride 0.9 % 50 mL Inject 1 g into the vein daily. 09/09/14   Nonie Hoyer, PA-C  methocarbamol (ROBAXIN) 500 MG tablet Take 2 tablets (1,000 mg total) by mouth every 8 (eight) hours as needed for muscle spasms (or pain). 08/31/14   Nonie Hoyer, PA-C  oxyCODONE (OXY IR/ROXICODONE) 5 MG immediate release tablet Take 1-3 tablets (5-15 mg total) by mouth every 6 (six) hours as needed for moderate pain, severe pain or breakthrough pain. 09/09/14   Nonie Hoyer, PA-C  oxyCODONE-acetaminophen (PERCOCET) 5-325 MG per tablet Take 2 tablets by mouth every 4 (four) hours as needed. 10/04/14   Arby Barrette, MD   BP 129/77 mmHg  Pulse 63  Temp(Src) 98 F (36.7 C) (Oral)  Resp 16  SpO2 99% Physical Exam  Constitutional: He is oriented to person, place, and time. He appears well-developed and well-nourished. No distress.  HENT:  Head: Normocephalic and atraumatic.  Eyes: EOM are normal. No scleral icterus.  Pulmonary/Chest: Effort normal.  Abdominal: Soft. He exhibits no distension. There is no tenderness.  See attached images of skin around ostomy bag  Musculoskeletal: Normal range of motion.  Neurological: He is alert and oriented to person, place, and time. Coordination normal.  Skin: Skin is warm and dry.  Psychiatric:  He has a normal mood and affect.        ED Course  Procedures (including critical care time) Labs Review Labs Reviewed - No data to display  Imaging Review No results found. I have personally reviewed and evaluated these images and lab results as part of my medical decision-making.   EKG Interpretation None      MDM   Final diagnoses:  Colostomy dysfunction  Candida infection   Surgical nurses come down and changed the colostomy bag with application of clotrimazole and dressings to area of  chemical dermatitis and Candida. The patient will have home wound care as well. At this time his Percocet as refill for pain control and the patient has surgical follow-up on Monday. He does not have acute problems intra-abdominal pain, vomiting or fever.    Arby Barrette, MD 10/04/14 [phone]

## 2014-10-04 MED ORDER — OXYCODONE-ACETAMINOPHEN 5-325 MG PO TABS: 2.0000 | ORAL_TABLET | ORAL | Status: DC | PRN

## 2014-10-04 MED ORDER — CLOTRIMAZOLE 1 % EX CREA: TOPICAL_CREAM | CUTANEOUS | Status: DC

## 2014-10-04 NOTE — Discharge Instructions (Signed)
°  Apply barrier dressing and try to keep any leakage from getting on skin is much as possible by placing absorptive dressings and changing frequently. See your doctor on Monday to continue urostomy wound care.

## 2014-10-04 NOTE — ED Notes (Signed)
Pt able to ambulate independently in room and dress independently

## 2014-10-04 NOTE — ED Notes (Signed)
Nolberto Hanlon, RN at bedside, did site cleansing and replacement of the colostomy bag with assistance of this RN.

## 2014-10-05 ENCOUNTER — Other Ambulatory Visit: Payer: Self-pay | Admitting: Surgery

## 2014-10-05 DIAGNOSIS — K5732 Diverticulitis of large intestine without perforation or abscess without bleeding: Secondary | ICD-10-CM

## 2014-10-07 ENCOUNTER — Ambulatory Visit
Admission: RE | Admit: 2014-10-07 | Discharge: 2014-10-07 | Disposition: A | Discharge status: Home / Self Care | Payer: BLUE CROSS/BLUE SHIELD | Source: Ambulatory Visit | Attending: Surgery | Admitting: Surgery

## 2014-10-07 DIAGNOSIS — K5732 Diverticulitis of large intestine without perforation or abscess without bleeding: Secondary | ICD-10-CM

## 2014-10-26 ENCOUNTER — Other Ambulatory Visit: Payer: Self-pay | Admitting: Surgery

## 2014-10-26 DIAGNOSIS — K5732 Diverticulitis of large intestine without perforation or abscess without bleeding: Secondary | ICD-10-CM

## 2014-11-25 ENCOUNTER — Other Ambulatory Visit: Payer: Self-pay | Admitting: Surgery

## 2014-11-26 ENCOUNTER — Ambulatory Visit
Admission: RE | Admit: 2014-11-26 | Discharge: 2014-11-26 | Disposition: A | Discharge status: Home / Self Care | Payer: BLUE CROSS/BLUE SHIELD | Source: Ambulatory Visit | Attending: Surgery | Admitting: Surgery

## 2014-11-26 DIAGNOSIS — K5732 Diverticulitis of large intestine without perforation or abscess without bleeding: Secondary | ICD-10-CM

## 2014-12-01 ENCOUNTER — Ambulatory Visit: Payer: Self-pay | Admitting: Surgery

## 2014-12-01 NOTE — H&P (Signed)
Bryan [name] 12/01/2014 4:35 PM Location: [facility] Patient #: [id number] DOB: 04/09/1970 Single / Language: English / Race: White Male  History of Present Illness (Melady Chow A. Charleen Madera MD; 12/01/2014 4:52 PM) Patient words: Patient returns for follow-up after sigmoid colectomy with anastomosis and diverting loop ileostomy. On his initial postop barium enema he did show some extravasation at the anastomosis. This was repeated last week and this appears to have healed. There is some barium securing the area by reviewed this myself and I saw no evidence of any significant extravasation from the anastomosis. He feels well with no abdominal pain.      CLINICAL DATA: History of resection of a portion of the sigmoid colon for diverticulitis with a the small leak at the time of the prior study, followup EXAM: BE WITH CONTRAST - WITHOUT AND WITH KUB CONTRAST: Water-soluble contrast FLUOROSCOPY TIME: Radiation Exposure Index (as provided by the fluoroscopic device): 320 deciGy per square cm If the device does not provide the exposure index: Fluoroscopy Time (in minutes and seconds): 2 minutes 12 second Number of Acquired Images: COMPARISON: KUB barium enema of 10/07/2014 FINDINGS: On the preliminary KUB there is some barium in the right lower quadrant which appears to correspond to the cecum. Also there is a small amount of linear barium in the left pelvis at the site of the prior extravasation. Water-soluble contrast was administered. There is mild spasm at the site of the primary closure of the sigmoid colon previously, but no definitive leakage of contrast is seen. Contrast adjacent to the area of prior extravasation of barium does overlie the area being evaluated making assessment slightly more difficult. However no definite point of leakage of water-soluble contrast is seen. The more proximal colon is unremarkable. On the post evacuation films no evidence of  current leakage of water-soluble contrast is seen. Again the area of prior leakage of a small amount of barium remains and does overlie the left lower quadrant. IMPRESSION: No present evidence of leakage of the water-soluble contrast is seen. Some residual barium from the prior extravasation does overlie this region making assessment somewhat more difficult. Electronically Signed By: Paul Barry M.D. On: 11/26/2014 09:25 .  The patient is a 44 year old male.   Allergies (Ashley Beck, CMA; 12/01/2014 4:36 PM) No Known Drug Allergies 09/14/2014  Medication History (Ashley Beck, CMA; 12/01/2014 4:36 PM) No Current Medications Medications Reconciled    Vitals (Ashley Beck CMA; 12/01/2014 4:37 PM) 12/01/2014 4:36 PM Weight: 250 lb Height: 70in Body Surface Area: 2.29 m Body Mass Index: 35.87 kg/m  Temp.: 98.3F(Temporal)  Pulse: 80 (Regular)  BP: 132/78 (Sitting, Left Arm, Standard)      Physical Exam (Hazyl Marseille A. Kristapher Dubu[place] MD; 12/01/2014 4:53 PM)  General Mental Status-Alert. General Appearance-Consistent with stated age. Hydration-Well hydrated. Voice-Normal.  Chest and Lung Exam Chest and lung exam reveals -quiet, even and easy respiratory effort with no use of accessory muscles and on auscultation, normal breath sounds, no adventitious sounds and normal vocal resonance. Inspection Chest Wall - Normal. Back - normal.  Cardiovascular Cardiovascular examination reveals -on palpation PMI is normal in location and amplitude, no palpable S3 or S4. Normal cardiac borders., normal heart sounds, regular rate and rhythm with no murmurs, carotid auscultation reveals no bruits and normal pedal pulses bilaterally.  Abdomen Note: Right lower quadrant ileostomy noted was some protrusion. Midline incision is healed. Soft nontender.  Neurologic Neurologic evaluation reveals -alert and oriented x 3 with no impairment of recent or remote    memory. Mental Status-Normal.    Assessment & Plan (Leonell Lobdell A. Creedence Heiss MD; 12/01/2014 4:53 PM)  ILEOSTOMY IN PLACE (Z93.2) Impression: Patient's most recent barium enema shows healing of his anastomosis. He is ready to have his ileostomy reversed. Discussed this at great length with him. Risks of surgery including bleeding, infection, abdominal abscess, potential replacement of ileostomy and/or colostomy, bowel injury, open procedure, injury to neighboring structures, blood clot, death, cardiovascular event. He is ready to proceed.  Current Plans The anatomy & physiology of the digestive tract was discussed. The pathophysiology was discussed. Possibility of remaining with an ostomy permanently was discussed. I offered ostomy takedown. Laparoscopic & open techniques were discussed.  Risks such as bleeding, infection, abscess, leak, reoperation, possible re-ostomy, injury to other organs, hernia, heart attack, death, and other risks were discussed. I noted a good likelihood this will help address the problem. Goals of post-operative recovery were discussed as well. We will work to minimize complications. Questions were answered. The patient expresses understanding & wishes to proceed with surgery.  You are being scheduled for surgery - Our schedulers will call you.  You should hear from our office's scheduling department within 5 working days about the location, date, and time of surgery. We try to make accommodations for patient's preferences in scheduling surgery, but sometimes the OR schedule or the surgeon's schedule prevents us from making those accommodations.  If you have not heard from our office [phone]) in 5 working days, call the office and ask for your surgeon's nurse.  If you have other questions about your diagnosis, plan, or surgery, call the office and ask for your surgeon's nurse.  Pt Education - [facility] Good Bowel Health (Gross) Pt Education - [facility] Fiber (AT) Pt Education -  [facility] Ostomy HCI (Gross): discussed with patient and provided information.

## 2014-12-23 NOTE — Pre-Procedure Instructions (Signed)
    Bryan [name]  12/23/2014      [id number] - [place], [place] - [address] [place] [place] [id number] Phone: [phone] Fax: [phone]    Your procedure is scheduled on 12/31/14.  [facility] at [facility].M.  Call this number if you have problems the morning of surgery:  [phone]   Remember:  Do not eat food or drink liquids after midnight.  Take these medicines the morning of surgery with A SIP OF WATER --tylenol   Do not wear jewelry, make-up or nail polish.  Do not wear lotions, powders, or perfumes.  You may wear deodorant.  Do not shave 48 hours prior to surgery.  Men may shave face and neck.  Do not bring valuables to the hospital.  [facility] is not responsible for any belongings or valuables.  Contacts, dentures or bridgework may not be worn into surgery.  Leave your suitcase in the car.  After surgery it may be brought to your room.  For patients admitted to the hospital, discharge time will be determined by your treatment team.  Patients discharged the day of surgery will not be allowed to drive home.   Name and phone number of your driver:   Special instructions:   Please read over the following fact sheets that you were given. Pain Booklet, Coughing and Deep Breathing and Surgical Site Infection Prevention

## 2014-12-24 ENCOUNTER — Encounter (HOSPITAL_COMMUNITY)
Admission: RE | Admit: 2014-12-24 | Discharge: 2014-12-24 | Disposition: A | Discharge status: Home / Self Care | Payer: BLUE CROSS/BLUE SHIELD | Source: Ambulatory Visit | Attending: Surgery | Admitting: Surgery

## 2014-12-24 ENCOUNTER — Encounter (HOSPITAL_COMMUNITY): Payer: Self-pay

## 2014-12-24 DIAGNOSIS — Z01818 Encounter for other preprocedural examination: Secondary | ICD-10-CM | POA: Insufficient documentation

## 2014-12-24 DIAGNOSIS — Z01812 Encounter for preprocedural laboratory examination: Secondary | ICD-10-CM | POA: Diagnosis not present

## 2014-12-24 DIAGNOSIS — Z932 Ileostomy status: Secondary | ICD-10-CM | POA: Insufficient documentation

## 2014-12-24 LAB — CBC WITH DIFFERENTIAL/PLATELET
BASOS ABS: 0.1 10*3/uL (ref 0.0–0.1)
Basophils Relative: 1 %
EOS PCT: 4 %
Eosinophils Absolute: 0.3 10*3/uL (ref 0.0–0.7)
HEMATOCRIT: 43.4 % (ref 39.0–52.0)
Hemoglobin: 14.6 g/dL (ref 13.0–17.0)
LYMPHS PCT: 23 %
Lymphs Abs: 2.1 10*3/uL (ref 0.7–4.0)
MCH: 27.8 pg (ref 26.0–34.0)
MCHC: 33.6 g/dL (ref 30.0–36.0)
MCV: 82.5 fL (ref 78.0–100.0)
MONO ABS: 0.5 10*3/uL (ref 0.1–1.0)
MONOS PCT: 6 %
NEUTROS ABS: 6.1 10*3/uL (ref 1.7–7.7)
Neutrophils Relative %: 66 %
PLATELETS: 221 10*3/uL (ref 150–400)
RBC: 5.26 MIL/uL (ref 4.22–5.81)
RDW: 14.4 % (ref 11.5–15.5)
WBC: 9.1 10*3/uL (ref 4.0–10.5)

## 2014-12-24 LAB — COMPREHENSIVE METABOLIC PANEL
ALT: 30 U/L (ref 17–63)
ANION GAP: 9 (ref 5–15)
AST: 22 U/L (ref 15–41)
Albumin: 3.9 g/dL (ref 3.5–5.0)
Alkaline Phosphatase: 58 U/L (ref 38–126)
BILIRUBIN TOTAL: 0.6 mg/dL (ref 0.3–1.2)
BUN: 14 mg/dL (ref 6–20)
CHLORIDE: 103 mmol/L (ref 101–111)
CO2: 26 mmol/L (ref 22–32)
CREATININE: 1.6 mg/dL — AB (ref 0.61–1.24)
Calcium: 9.1 mg/dL (ref 8.9–10.3)
GFR, EST AFRICAN AMERICAN: 58 mL/min — AB (ref >60)
GFR, EST NON AFRICAN AMERICAN: 50 mL/min — AB (ref >60)
Glucose, Bld: 106 mg/dL — ABNORMAL HIGH (ref 65–99)
POTASSIUM: 4.1 mmol/L (ref 3.5–5.1)
Sodium: 138 mmol/L (ref 135–145)
TOTAL PROTEIN: 7.2 g/dL (ref 6.5–8.1)

## 2014-12-25 NOTE — Progress Notes (Signed)
Anesthesia Chart Review:  Pt is 44 year old male scheduled for ileostomy closure on 12/31/2014 with Dr. Luisa Hartornett.   PMH includes:  Diverticulitis of colon with perforation. Never smoker. BMI 37. S/p laparoscopic assisted partial colectomy with ostomy 08/26/14.   Medications include: minocycline  Preoperative labs reviewed.  Cr 1.6.  Prior results typically 1.25-1.5.  1 view CXR 09/02/14: No active disease.   EKG not required.   If no changes, I anticipate pt can proceed with surgery as scheduled.   Rica Mastngela Kayliee Atienza, FNP-BC [facility]hesiology Phone: [phone] 12/25/2014 1:26 PM

## 2014-12-30 MED ORDER — CEFOTETAN DISODIUM-DEXTROSE 2-2.08 GM-% IV SOLR
2.0000 g | INTRAVENOUS | Status: AC
  Administered 2014-12-31: 2 g via INTRAVENOUS
  Filled 2014-12-30 (×2): qty 50

## 2014-12-31 ENCOUNTER — Inpatient Hospital Stay (HOSPITAL_COMMUNITY)
Admission: AD | Admit: 2014-12-31 | Discharge: 2015-01-06 | DRG: 330 | Disposition: A | Discharge status: Home / Self Care | Payer: BLUE CROSS/BLUE SHIELD | Source: Ambulatory Visit | Attending: Surgery | Admitting: Surgery

## 2014-12-31 ENCOUNTER — Encounter (HOSPITAL_COMMUNITY): Payer: Self-pay | Admitting: *Deleted

## 2014-12-31 ENCOUNTER — Inpatient Hospital Stay (HOSPITAL_COMMUNITY): Payer: BLUE CROSS/BLUE SHIELD | Admitting: Emergency Medicine

## 2014-12-31 ENCOUNTER — Inpatient Hospital Stay (HOSPITAL_COMMUNITY): Payer: BLUE CROSS/BLUE SHIELD | Admitting: Anesthesiology

## 2014-12-31 ENCOUNTER — Encounter (HOSPITAL_COMMUNITY)
Admission: AD | Disposition: A | Discharge status: Home / Self Care | Payer: Self-pay | Source: Ambulatory Visit | Attending: Surgery

## 2014-12-31 DIAGNOSIS — E669 Obesity, unspecified: Secondary | ICD-10-CM | POA: Diagnosis present

## 2014-12-31 DIAGNOSIS — Z6835 Body mass index (BMI) 35.0-35.9, adult: Secondary | ICD-10-CM | POA: Diagnosis not present

## 2014-12-31 DIAGNOSIS — Z932 Ileostomy status: Secondary | ICD-10-CM

## 2014-12-31 DIAGNOSIS — Z432 Encounter for attention to ileostomy: Principal | ICD-10-CM

## 2014-12-31 DIAGNOSIS — K5792 Diverticulitis of intestine, part unspecified, without perforation or abscess without bleeding: Secondary | ICD-10-CM | POA: Diagnosis present

## 2014-12-31 HISTORY — PX: ILEOSTOMY CLOSURE: SHX1784

## 2014-12-31 LAB — CBC
HEMATOCRIT: 40.1 % (ref 39.0–52.0)
Hemoglobin: 13.2 g/dL (ref 13.0–17.0)
MCH: 28.6 pg (ref 26.0–34.0)
MCHC: 32.9 g/dL (ref 30.0–36.0)
MCV: 86.8 fL (ref 78.0–100.0)
Platelets: 205 10*3/uL (ref 150–400)
RBC: 4.62 MIL/uL (ref 4.22–5.81)
RDW: 14.3 % (ref 11.5–15.5)
WBC: 13.5 10*3/uL — AB (ref 4.0–10.5)

## 2014-12-31 LAB — CREATININE, SERUM
Creatinine, Ser: 1.52 mg/dL — ABNORMAL HIGH (ref 0.61–1.24)
GFR calc non Af Amer: 53 mL/min — ABNORMAL LOW (ref >60)

## 2014-12-31 SURGERY — CLOSURE, ILEOSTOMY
Anesthesia: General | Site: Abdomen

## 2014-12-31 MED ORDER — ONDANSETRON HCL 4 MG/2ML IJ SOLN
INTRAMUSCULAR | Status: AC
  Filled 2014-12-31: qty 4

## 2014-12-31 MED ORDER — HYDROMORPHONE HCL 1 MG/ML IJ SOLN
INTRAMUSCULAR | Status: AC
  Administered 2014-12-31: 0.5 mg via INTRAVENOUS
  Filled 2014-12-31: qty 2

## 2014-12-31 MED ORDER — OXYCODONE HCL 5 MG/5ML PO SOLN: 5.0000 mg | Freq: Once | ORAL | Status: DC | PRN

## 2014-12-31 MED ORDER — KCL IN DEXTROSE-NACL 20-5-0.9 MEQ/L-%-% IV SOLN
INTRAVENOUS | Status: DC
  Administered 2014-12-31 – 2015-01-03 (×3): via INTRAVENOUS
  Filled 2014-12-31 (×8): qty 1000

## 2014-12-31 MED ORDER — PHENYLEPHRINE 40 MCG/ML (10ML) SYRINGE FOR IV PUSH (FOR BLOOD PRESSURE SUPPORT)
PREFILLED_SYRINGE | INTRAVENOUS | Status: AC
  Filled 2014-12-31: qty 10

## 2014-12-31 MED ORDER — NEOSTIGMINE METHYLSULFATE 10 MG/10ML IV SOLN
INTRAVENOUS | Status: AC
  Filled 2014-12-31: qty 1

## 2014-12-31 MED ORDER — MIDAZOLAM HCL 2 MG/2ML IJ SOLN
INTRAMUSCULAR | Status: AC
  Filled 2014-12-31: qty 2

## 2014-12-31 MED ORDER — HYDROMORPHONE HCL 1 MG/ML IJ SOLN
INTRAMUSCULAR | Status: AC
  Administered 2014-12-31: 0.5 mg via INTRAVENOUS
  Filled 2014-12-31: qty 1

## 2014-12-31 MED ORDER — FENTANYL CITRATE (PF) 250 MCG/5ML IJ SOLN
INTRAMUSCULAR | Status: AC
  Filled 2014-12-31: qty 5

## 2014-12-31 MED ORDER — VECURONIUM BROMIDE 10 MG IV SOLR
INTRAVENOUS | Status: DC | PRN
  Administered 2014-12-31: 2 mg via INTRAVENOUS
  Administered 2014-12-31: 1 mg via INTRAVENOUS
  Administered 2014-12-31: 2 mg via INTRAVENOUS

## 2014-12-31 MED ORDER — KCL IN DEXTROSE-NACL 20-5-0.45 MEQ/L-%-% IV SOLN
INTRAVENOUS | Status: AC
  Administered 2014-12-31: 1000 mL
  Filled 2014-12-31: qty 1000

## 2014-12-31 MED ORDER — DEXAMETHASONE SODIUM PHOSPHATE 10 MG/ML IJ SOLN
INTRAMUSCULAR | Status: DC | PRN
  Administered 2014-12-31: 8 mg via INTRAVENOUS

## 2014-12-31 MED ORDER — SUGAMMADEX SODIUM 500 MG/5ML IV SOLN
INTRAVENOUS | Status: DC | PRN
  Administered 2014-12-31: 470 mg via INTRAVENOUS

## 2014-12-31 MED ORDER — OXYCODONE HCL 5 MG PO TABS
5.0000 mg | ORAL_TABLET | ORAL | Status: DC | PRN
  Administered 2014-12-31 – 2015-01-06 (×11): 10 mg via ORAL
  Filled 2014-12-31 (×10): qty 2

## 2014-12-31 MED ORDER — PHENYLEPHRINE HCL 10 MG/ML IJ SOLN
INTRAMUSCULAR | Status: DC | PRN
  Administered 2014-12-31: 40 ug via INTRAVENOUS
  Administered 2014-12-31 (×2): 80 ug via INTRAVENOUS
  Administered 2014-12-31 (×2): 40 ug via INTRAVENOUS
  Administered 2014-12-31: 80 ug via INTRAVENOUS
  Administered 2014-12-31: 40 ug via INTRAVENOUS

## 2014-12-31 MED ORDER — DEXTROSE 5 % IV SOLN
2.0000 g | Freq: Two times a day (BID) | INTRAVENOUS | Status: AC
  Administered 2014-12-31: 2 g via INTRAVENOUS
  Filled 2014-12-31: qty 2

## 2014-12-31 MED ORDER — LACTATED RINGERS IV SOLN
INTRAVENOUS | Status: DC | PRN
  Administered 2014-12-31 (×2): via INTRAVENOUS

## 2014-12-31 MED ORDER — PROPOFOL 10 MG/ML IV BOLUS
INTRAVENOUS | Status: AC
  Filled 2014-12-31: qty 20

## 2014-12-31 MED ORDER — LIDOCAINE HCL (CARDIAC) 20 MG/ML IV SOLN
INTRAVENOUS | Status: AC
  Filled 2014-12-31: qty 10

## 2014-12-31 MED ORDER — ONDANSETRON 4 MG PO TBDP: 4.0000 mg | ORAL_TABLET | Freq: Four times a day (QID) | ORAL | Status: DC | PRN

## 2014-12-31 MED ORDER — ONDANSETRON HCL 4 MG/2ML IJ SOLN: 4.0000 mg | Freq: Four times a day (QID) | INTRAMUSCULAR | Status: DC | PRN

## 2014-12-31 MED ORDER — ENOXAPARIN SODIUM 40 MG/0.4ML ~~LOC~~ SOLN
40.0000 mg | SUBCUTANEOUS | Status: DC
  Administered 2015-01-01 – 2015-01-06 (×6): 40 mg via SUBCUTANEOUS
  Filled 2014-12-31 (×6): qty 0.4

## 2014-12-31 MED ORDER — OXYCODONE HCL 5 MG PO TABS: 5.0000 mg | ORAL_TABLET | Freq: Once | ORAL | Status: DC | PRN

## 2014-12-31 MED ORDER — KETOROLAC TROMETHAMINE 30 MG/ML IJ SOLN
INTRAMUSCULAR | Status: AC
  Filled 2014-12-31: qty 1

## 2014-12-31 MED ORDER — ROCURONIUM BROMIDE 50 MG/5ML IV SOLN
INTRAVENOUS | Status: AC
Start: 2014-12-31 — End: 2014-12-31
  Filled 2014-12-31: qty 2

## 2014-12-31 MED ORDER — HYDROMORPHONE HCL 1 MG/ML IJ SOLN
0.5000 mg | INTRAMUSCULAR | Status: AC | PRN
  Administered 2014-12-31 (×4): 0.5 mg via INTRAVENOUS

## 2014-12-31 MED ORDER — MIDAZOLAM HCL 5 MG/5ML IJ SOLN
INTRAMUSCULAR | Status: DC | PRN
  Administered 2014-12-31: 2 mg via INTRAVENOUS

## 2014-12-31 MED ORDER — ONDANSETRON HCL 4 MG/2ML IJ SOLN
4.0000 mg | Freq: Four times a day (QID) | INTRAMUSCULAR | Status: DC | PRN
  Administered 2015-01-04: 4 mg via INTRAVENOUS
  Filled 2014-12-31 (×2): qty 2

## 2014-12-31 MED ORDER — LIDOCAINE HCL (CARDIAC) 20 MG/ML IV SOLN
INTRAVENOUS | Status: DC | PRN
  Administered 2014-12-31: 75 mg via INTRAVENOUS

## 2014-12-31 MED ORDER — FENTANYL CITRATE (PF) 100 MCG/2ML IJ SOLN
INTRAMUSCULAR | Status: DC | PRN
Start: 2014-12-31 — End: 2014-12-31
  Administered 2014-12-31 (×2): 50 ug via INTRAVENOUS
  Administered 2014-12-31: 100 ug via INTRAVENOUS
  Administered 2014-12-31: 50 ug via INTRAVENOUS

## 2014-12-31 MED ORDER — PHENYLEPHRINE 40 MCG/ML (10ML) SYRINGE FOR IV PUSH (FOR BLOOD PRESSURE SUPPORT)
PREFILLED_SYRINGE | INTRAVENOUS | Status: AC
Start: 2014-12-31 — End: 2014-12-31
  Filled 2014-12-31: qty 10

## 2014-12-31 MED ORDER — 0.9 % SODIUM CHLORIDE (POUR BTL) OPTIME
TOPICAL | Status: DC | PRN
  Administered 2014-12-31 (×2): 1000 mL

## 2014-12-31 MED ORDER — ROCURONIUM BROMIDE 100 MG/10ML IV SOLN
INTRAVENOUS | Status: DC | PRN
  Administered 2014-12-31: 50 mg via INTRAVENOUS

## 2014-12-31 MED ORDER — CHLORHEXIDINE GLUCONATE 4 % EX LIQD: 1.0000 "application " | Freq: Once | CUTANEOUS | Status: DC

## 2014-12-31 MED ORDER — HYDROMORPHONE HCL 1 MG/ML IJ SOLN
1.0000 mg | INTRAMUSCULAR | Status: DC | PRN
  Administered 2014-12-31 – 2015-01-04 (×30): 1 mg via INTRAVENOUS
  Filled 2014-12-31 (×32): qty 1

## 2014-12-31 MED ORDER — SIMETHICONE 80 MG PO CHEW
40.0000 mg | CHEWABLE_TABLET | Freq: Four times a day (QID) | ORAL | Status: DC | PRN
  Administered 2015-01-05: 40 mg via ORAL
  Filled 2014-12-31: qty 1

## 2014-12-31 MED ORDER — VECURONIUM BROMIDE 10 MG IV SOLR
INTRAVENOUS | Status: AC
  Filled 2014-12-31: qty 10

## 2014-12-31 MED ORDER — GLYCOPYRROLATE 0.2 MG/ML IJ SOLN
INTRAMUSCULAR | Status: AC
  Filled 2014-12-31: qty 4

## 2014-12-31 MED ORDER — HYDROMORPHONE HCL 1 MG/ML IJ SOLN
0.2500 mg | INTRAMUSCULAR | Status: DC | PRN
  Administered 2014-12-31 (×4): 0.5 mg via INTRAVENOUS

## 2014-12-31 MED ORDER — DEXAMETHASONE SODIUM PHOSPHATE 4 MG/ML IJ SOLN
INTRAMUSCULAR | Status: AC
  Filled 2014-12-31: qty 2

## 2014-12-31 MED ORDER — STERILE WATER FOR INJECTION IJ SOLN
INTRAMUSCULAR | Status: AC
  Filled 2014-12-31: qty 10

## 2014-12-31 MED ORDER — PROPOFOL 10 MG/ML IV BOLUS
INTRAVENOUS | Status: DC | PRN
  Administered 2014-12-31: 200 mg via INTRAVENOUS

## 2014-12-31 MED ORDER — KETOROLAC TROMETHAMINE 30 MG/ML IJ SOLN
30.0000 mg | Freq: Once | INTRAMUSCULAR | Status: AC
  Administered 2014-12-31: 30 mg via INTRAVENOUS

## 2014-12-31 MED ORDER — ONDANSETRON HCL 4 MG/2ML IJ SOLN
INTRAMUSCULAR | Status: DC | PRN
  Administered 2014-12-31: 4 mg via INTRAVENOUS

## 2014-12-31 MED ORDER — SUGAMMADEX SODIUM 500 MG/5ML IV SOLN
INTRAVENOUS | Status: AC
  Filled 2014-12-31: qty 5

## 2014-12-31 MED ORDER — SUCCINYLCHOLINE CHLORIDE 20 MG/ML IJ SOLN
INTRAMUSCULAR | Status: AC
  Filled 2014-12-31: qty 1

## 2014-12-31 MED ORDER — OXYCODONE HCL 5 MG PO TABS
ORAL_TABLET | ORAL | Status: AC
  Filled 2014-12-31: qty 2

## 2014-12-31 MED ORDER — SODIUM CHLORIDE 0.9 % IJ SOLN
INTRAMUSCULAR | Status: AC
  Filled 2014-12-31: qty 10

## 2014-12-31 MED ORDER — EPHEDRINE SULFATE 50 MG/ML IJ SOLN
INTRAMUSCULAR | Status: AC
  Filled 2014-12-31: qty 1

## 2014-12-31 SURGICAL SUPPLY — 66 items
BLADE SURG ROTATE 9660 (MISCELLANEOUS) IMPLANT
CANISTER SUCTION 2500CC (MISCELLANEOUS) ×3 IMPLANT
COVER MAYO STAND STRL (DRAPES) ×6 IMPLANT
COVER SURGICAL LIGHT HANDLE (MISCELLANEOUS) ×6 IMPLANT
DRAPE LAPAROSCOPIC ABDOMINAL (DRAPES) ×3 IMPLANT
DRAPE PROXIMA HALF (DRAPES) IMPLANT
DRAPE UTILITY XL STRL (DRAPES) ×15 IMPLANT
DRAPE WARM FLUID 44X44 (DRAPE) ×3 IMPLANT
DRSG OPSITE POSTOP 4X10 (GAUZE/BANDAGES/DRESSINGS) IMPLANT
DRSG OPSITE POSTOP 4X8 (GAUZE/BANDAGES/DRESSINGS) IMPLANT
DRSG TELFA 3X8 NADH (GAUZE/BANDAGES/DRESSINGS) ×3 IMPLANT
ELECT BLADE 6.5 EXT (BLADE) ×3 IMPLANT
ELECT CAUTERY BLADE 6.4 (BLADE) ×6 IMPLANT
ELECT REM PT RETURN 9FT ADLT (ELECTROSURGICAL) ×3
ELECTRODE REM PT RTRN 9FT ADLT (ELECTROSURGICAL) ×1 IMPLANT
GEL ULTRASOUND 20GR AQUASONIC (MISCELLANEOUS) IMPLANT
GLOVE BIO SURGEON STRL SZ 6.5 (GLOVE) ×2 IMPLANT
GLOVE BIO SURGEON STRL SZ7.5 (GLOVE) ×6 IMPLANT
GLOVE BIO SURGEON STRL SZ8 (GLOVE) ×6 IMPLANT
GLOVE BIO SURGEONS STRL SZ 6.5 (GLOVE) ×1
GLOVE BIOGEL PI IND STRL 6.5 (GLOVE) ×1 IMPLANT
GLOVE BIOGEL PI IND STRL 7.0 (GLOVE) ×2 IMPLANT
GLOVE BIOGEL PI IND STRL 7.5 (GLOVE) ×1 IMPLANT
GLOVE BIOGEL PI IND STRL 8 (GLOVE) ×2 IMPLANT
GLOVE BIOGEL PI INDICATOR 6.5 (GLOVE) ×2
GLOVE BIOGEL PI INDICATOR 7.0 (GLOVE) ×4
GLOVE BIOGEL PI INDICATOR 7.5 (GLOVE) ×2
GLOVE BIOGEL PI INDICATOR 8 (GLOVE) ×4
GLOVE SURG SS PI 7.0 STRL IVOR (GLOVE) ×6 IMPLANT
GOWN STRL REUS W/ TWL LRG LVL3 (GOWN DISPOSABLE) ×4 IMPLANT
GOWN STRL REUS W/ TWL XL LVL3 (GOWN DISPOSABLE) ×2 IMPLANT
GOWN STRL REUS W/TWL LRG LVL3 (GOWN DISPOSABLE) ×8
GOWN STRL REUS W/TWL XL LVL3 (GOWN DISPOSABLE) ×4
KIT BASIN OR (CUSTOM PROCEDURE TRAY) ×3 IMPLANT
KIT ROOM TURNOVER OR (KITS) ×3 IMPLANT
LEGGING LITHOTOMY PAIR STRL (DRAPES) IMPLANT
LIGASURE IMPACT 36 18CM CVD LR (INSTRUMENTS) ×3 IMPLANT
NS IRRIG 1000ML POUR BTL (IV SOLUTION) ×6 IMPLANT
PACK GENERAL/GYN (CUSTOM PROCEDURE TRAY) ×3 IMPLANT
PAD ARMBOARD 7.5X6 YLW CONV (MISCELLANEOUS) ×3 IMPLANT
PENCIL BUTTON HOLSTER BLD 10FT (ELECTRODE) ×3 IMPLANT
RELOAD PROXIMATE 75MM BLUE (ENDOMECHANICALS) ×9 IMPLANT
SPONGE GAUZE 4X4 12PLY STER LF (GAUZE/BANDAGES/DRESSINGS) ×3 IMPLANT
SPONGE LAP 18X18 X RAY DECT (DISPOSABLE) IMPLANT
STAPLER GUN LINEAR PROX 60 (STAPLE) ×3 IMPLANT
STAPLER PROXIMATE 75MM BLUE (STAPLE) ×3 IMPLANT
STAPLER VISISTAT 35W (STAPLE) ×3 IMPLANT
SUCTION POOLE TIP (SUCTIONS) ×3 IMPLANT
SURGILUBE 2OZ TUBE FLIPTOP (MISCELLANEOUS) IMPLANT
SUT PDS AB 1 TP1 54 (SUTURE) ×6 IMPLANT
SUT PDS AB 1 TP1 96 (SUTURE) ×6 IMPLANT
SUT PROLENE 2 0 CT2 30 (SUTURE) IMPLANT
SUT PROLENE 2 0 KS (SUTURE) IMPLANT
SUT SILK 3 0 TIES 10X30 (SUTURE) ×3 IMPLANT
SUT VIC AB 2-0 SH 18 (SUTURE) ×3 IMPLANT
SUT VIC AB 3-0 SH 18 (SUTURE) ×3 IMPLANT
SUT VICRYL AB 2 0 TIES (SUTURE) ×3 IMPLANT
SUT VICRYL AB 3 0 TIES (SUTURE) ×3 IMPLANT
SYR BULB IRRIGATION 50ML (SYRINGE) ×3 IMPLANT
TOWEL OR 17X26 10 PK STRL BLUE (TOWEL DISPOSABLE) ×6 IMPLANT
TRAY FOLEY CATH 16FRSI W/METER (SET/KITS/TRAYS/PACK) IMPLANT
TRAY PROCTOSCOPIC FIBER OPTIC (SET/KITS/TRAYS/PACK) IMPLANT
TUBE CONNECTING 12'X1/4 (SUCTIONS) ×1
TUBE CONNECTING 12X1/4 (SUCTIONS) ×2 IMPLANT
UNDERPAD 30X30 INCONTINENT (UNDERPADS AND DIAPERS) IMPLANT
YANKAUER SUCT BULB TIP NO VENT (SUCTIONS) ×3 IMPLANT

## 2014-12-31 NOTE — Anesthesia Preprocedure Evaluation (Addendum)
Anesthesia Evaluation  Patient identified by MRN, date of birth, ID band Patient awake    Reviewed: Allergy & Precautions, NPO status , Patient's Chart, lab work & pertinent test results  Airway Mallampati: II  TM Distance: <3 FB Neck ROM: full  Mouth opening: Limited Mouth Opening  Dental  (+) Teeth Intact, Dental Advidsory Given   Pulmonary    breath sounds clear to auscultation       Cardiovascular negative cardio ROS   Rhythm:regular Rate:Normal     Neuro/Psych    GI/Hepatic S/p colectomy.   Endo/Other  obese  Renal/GU      Musculoskeletal   Abdominal   Peds  Hematology   Anesthesia Other Findings   Reproductive/Obstetrics                            Anesthesia Physical Anesthesia Plan  ASA: II  Anesthesia Plan: General   Post-op Pain Management:    Induction: Intravenous  Airway Management Planned: Oral ETT  Additional Equipment:   Intra-op Plan:   Post-operative Plan: Extubation in OR  Informed Consent: I have reviewed the patients History and Physical, chart, labs and discussed the procedure including the risks, benefits and alternatives for the proposed anesthesia with the patient or authorized representative who has indicated his/her understanding and acceptance.   Dental Advisory Given  Plan Discussed with: CRNA, Anesthesiologist and Surgeon  Anesthesia Plan Comments:        Anesthesia Quick Evaluation

## 2014-12-31 NOTE — Progress Notes (Signed)
Foley catheter removed per MD order.  Patient tolerated well.  

## 2014-12-31 NOTE — Anesthesia Procedure Notes (Signed)
Procedure Name: Intubation Date/Time: 12/31/2014 7:24 AM Performed by: Carmela RimaMARTINELLI, Girtha Kilgore F Pre-anesthesia Checklist: Patient being monitored, Suction available, Emergency Drugs available, Timeout performed and Patient identified Patient Re-evaluated:Patient Re-evaluated prior to inductionOxygen Delivery Method: Circle system utilized Preoxygenation: Pre-oxygenation with 100% oxygen Intubation Type: IV induction Ventilation: Mask ventilation without difficulty Laryngoscope Size: Glidescope and 3 Grade View: Grade II Tube type: Oral Tube size: 7.5 mm Number of attempts: 1 Placement Confirmation: positive ETCO2,  ETT inserted through vocal cords under direct vision and breath sounds checked- equal and bilateral Secured at: 22 cm Tube secured with: Tape Dental Injury: Teeth and Oropharynx as per pre-operative assessment

## 2014-12-31 NOTE — Transfer of Care (Signed)
Immediate Anesthesia Transfer of Care Note  Patient: Bryan [name]  Procedure(s) Performed: Procedure(s): ILEOSTOMY CLOSURE (N/A)  Patient Location: PACU  Anesthesia Type:General  Level of Consciousness: awake, alert  and oriented  Airway & Oxygen Therapy: Patient Spontanous Breathing and Patient connected to face mask oxygen  Post-op Assessment: Report given to RN, Post -op Vital signs reviewed and stable and Patient moving all extremities X 4  Post vital signs: Reviewed and stable  Last Vitals:  Filed Vitals:   12/31/14 0557 12/31/14 0913  BP: 138/94   Pulse:    Temp:  36.9 C  Resp:      Complications: No apparent anesthesia complications

## 2014-12-31 NOTE — Op Note (Signed)
Preoperative diagnosis: Ileostomy in place  Postoperative diagnosis: Same  Procedure: Closure of loop ileostomy  Surgeon: Harriette Bouillonhomas Jaydalee Bardwell M.D.  Anesthesia: Gen. 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 He underwent preoperative assessment was anastomosis and this was intact. He is now ready to have his ileostomy closed to restore his gastrointestinal tract continuity. The procedure has been discussed with the patient.  Alternative therapies have been discussed with the patient.  Operative risks include bleeding,  Infection,  Organ injury,  Nerve injury, leak   Blood vessel injury,  DVT,  Pulmonary embolism,  Death,  And possible reoperation.  Medical management risks include worsening of present situation.  The success of the procedure is 50 -90 % at treating patients symptoms.  The patient understands and agrees to proceed.   Description of procedure.  The patient was seen in the holding area and questions were answered. He was taken back to the operating room and placed upon the OR table. After induction of general endotracheal anesthesia, the abdomen was prepped and draped in sterile fashion. 2-0 Vicryl was used to close the lumen of the ostomy. Timeout was done to verify proper procedure and patient. He received 1 g of cefoxitin. Curvilinear incision made above and below the ostomy. Dissection was carried down through the subcutaneous fat to the fascia the abdominal wall. The peritoneum was incised which helped mobilize the loop ileostomy. Once fully mobilized, the ileostomy was resected using 2 firings of a GIA 75 stapler. The mesentery is taken down with LigaSure. A side-to-side functional end anastomosis was used. This is created by using 2 firings of the GIA 75 stapler and a single firing of the TA 60 stapler.  The lumen was widely patent and hemostatic. There is no Froom for 2 fingers to fit through the anastomosis. There is no undue tension or twisting area a single stitch was placed across the anastomosis of 2-0 Vicryl. Mesenteric defect closed with 2-0 Vicryl. The ostomy was patent with no signs of leakage. This was placed back into the abdominal cavity. Fascia closed with #1 PDS. Subcutaneous tissues irrigated. Staples used to approximate the skin and wound wicks used. All final counts found to be correct. Dry dressing applied. Patient awoke extubated taken to recovery in satisfactory condition.

## 2014-12-31 NOTE — Anesthesia Postprocedure Evaluation (Signed)
Anesthesia Post Note  Patient: Bryan LeschChristopher J Demarais  Procedure(s) Performed: Procedure(s) (LRB): ILEOSTOMY CLOSURE (N/A)  Patient location during evaluation: PACU Anesthesia Type: General Level of consciousness: awake and alert Pain management: pain level controlled Vital Signs Assessment: post-procedure vital signs reviewed and stable Respiratory status: spontaneous breathing, nonlabored ventilation and respiratory function stable Cardiovascular status: blood pressure returned to baseline and stable Postop Assessment: no signs of nausea or vomiting Anesthetic complications: no    Last Vitals:  Filed Vitals:   12/31/14 1057 12/31/14 2021  BP: 126/76 119/74  Pulse: 83 77  Temp: 36.6 C 36.8 C  Resp: 16 17    Last Pain:  Filed Vitals:   12/31/14 2022  PainSc: 4                  [name]

## 2014-12-31 NOTE — Progress Notes (Signed)
Patient arrived on unit via stretcher from OR with foley catheter and IV fluids infusing.  No family at bedside. Dr. Luisa Hartornett notified concerning foley catheter.  Will discontinue foley catheter per MD order.

## 2014-12-31 NOTE — H&P (View-Only) (Signed)
Bryan [name] 12/01/2014 4:35 PM Location: [facility] Patient #: [id number] DOB: [birth year] Single / Language: [place] / Race: White Male  History of Present Illness Bryan Fus A. Cosme Jacob MD; 12/01/2014 4:52 PM) Patient words: Patient returns for follow-up after sigmoid colectomy with anastomosis and diverting loop ileostomy. On his initial postop barium enema he did show some extravasation at the anastomosis. This was repeated last week and this appears to have healed. There is some barium securing the area by reviewed this myself and I saw no evidence of any significant extravasation from the anastomosis. He feels well with no abdominal pain.      CLINICAL DATA: History of resection of a portion of the sigmoid colon for diverticulitis with a the small leak at the time of the prior study, followup EXAM: BE WITH CONTRAST - WITHOUT AND WITH KUB CONTRAST: Water-soluble contrast FLUOROSCOPY TIME: Radiation Exposure Index (as provided by the fluoroscopic device): 320 deciGy per square cm If the device does not provide the exposure index: Fluoroscopy Time (in minutes and seconds): 2 minutes 12 second Number of Acquired Images: COMPARISON: KUB barium enema of 10/07/2014 FINDINGS: On the preliminary KUB there is some barium in the right lower quadrant which appears to correspond to the cecum. Also there is a small amount of linear barium in the left pelvis at the site of the prior extravasation. Water-soluble contrast was administered. There is mild spasm at the site of the primary closure of the sigmoid colon previously, but no definitive leakage of contrast is seen. Contrast adjacent to the area of prior extravasation of barium does overlie the area being evaluated making assessment slightly more difficult. However no definite point of leakage of water-soluble contrast is seen. The more proximal colon is unremarkable. On the post evacuation films no evidence of  current leakage of water-soluble contrast is seen. Again the area of prior leakage of a small amount of barium remains and does overlie the left lower quadrant. IMPRESSION: No present evidence of leakage of the water-soluble contrast is seen. Some residual barium from the prior extravasation does overlie this region making assessment somewhat more difficult. Electronically Signed By: Dwyane Dee M.D. On: 11/26/2014 09:25 .  The patient is a 44 year old male.   Allergies Fay Records, CMA; 12/01/2014 4:36 PM) No Known Drug Allergies 09/14/2014  Medication History Fay Records, [place]; 12/01/2014 4:36 PM) No Current Medications Medications Reconciled    Vitals Fay Records CMA; 12/01/2014 4:37 PM) 12/01/2014 4:36 PM Weight: 250 lb Height: 70in Body Surface Area: 2.29 m Body Mass Index: 35.87 kg/m  Temp.: 98.81F(Temporal)  Pulse: 80 (Regular)  BP: 132/78 (Sitting, Left Arm, Standard)      Physical Exam ([place] Perra A. Delani Kohli MD; 12/01/2014 4:53 PM)  General Mental Status-Alert. General Appearance-Consistent with stated age. Hydration-Well hydrated. Voice-Normal.  Chest and Lung Exam Chest and lung exam reveals -quiet, even and easy respiratory effort with no use of accessory muscles and on auscultation, normal breath sounds, no adventitious sounds and normal vocal resonance. Inspection Chest Wall - Normal. Back - normal.  Cardiovascular Cardiovascular examination reveals -on palpation PMI is normal in location and amplitude, no palpable S3 or S4. Normal cardiac borders., normal heart sounds, regular rate and rhythm with no murmurs, carotid auscultation reveals no bruits and normal pedal pulses bilaterally.  Abdomen Note: Right lower quadrant ileostomy noted was some protrusion. Midline incision is healed. Soft nontender.  Neurologic Neurologic evaluation reveals -alert and oriented x 3 with no impairment of recent or remote  memory. Mental Status-Normal.    Assessment & Plan (Jazara Swiney A. Jayden Rudge MD; 12/01/2014 4:53 PM)  ILEOSTOMY IN PLACE (Z93.2) Impression: Patient's most recent barium enema shows healing of his anastomosis. He is ready to have his ileostomy reversed. Discussed this at great length with him. Risks of surgery including bleeding, infection, abdominal abscess, potential replacement of ileostomy and/or colostomy, bowel injury, open procedure, injury to neighboring structures, blood clot, death, cardiovascular event. He is ready to proceed.  Current Plans The anatomy & physiology of the digestive tract was discussed. The pathophysiology was discussed. Possibility of remaining with an ostomy permanently was discussed. I offered ostomy takedown. Laparoscopic & open techniques were discussed.  Risks such as bleeding, infection, abscess, leak, reoperation, possible re-ostomy, injury to other organs, hernia, heart attack, death, and other risks were discussed. I noted a good likelihood this will help address the problem. Goals of post-operative recovery were discussed as well. We will work to minimize complications. Questions were answered. The patient expresses understanding & wishes to proceed with surgery.  You are being scheduled for surgery - Our schedulers will call you.  You should hear from our office's scheduling department within 5 working days about the location, date, and time of surgery. We try to make accommodations for patient's preferences in scheduling surgery, but sometimes the OR schedule or the surgeon's schedule prevents us from making those accommodations.  If you have not heard from our office [phone] in 5 working days, call the office and ask for your surgeon's nurse.  If you have other questions about your diagnosis, plan, or surgery, call the office and ask for your surgeon's nurse.  Pt Education - [facility] Good Bowel Health (Gross) Pt Education - [facility] Fiber (AT) Pt Education -  [facility] Ostomy HCI (Gross): discussed with patient and provided information.

## 2014-12-31 NOTE — Interval H&P Note (Signed)
History and Physical Interval Note:  12/31/2014 6:56 AM  Bryan [name]  has presented today for surgery, with the diagnosis of ILEOSTOMY IN PLACE  The various methods of treatment have been discussed with the patient and family. After consideration of risks, benefits and other options for treatment, the patient has consented to  Procedure(s): ILEOSTOMY CLOSURE (N/A) as a surgical intervention .  The patient's history has been reviewed, patient examined, no change in status, stable for surgery.  I have reviewed the patient's chart and labs.  Questions were answered to the patient's satisfaction.     Era Parr A.

## 2015-01-01 ENCOUNTER — Encounter (HOSPITAL_COMMUNITY): Payer: Self-pay | Admitting: Surgery

## 2015-01-01 LAB — CBC
HEMATOCRIT: 43.2 % (ref 39.0–52.0)
HEMOGLOBIN: 14.4 g/dL (ref 13.0–17.0)
MCH: 28.2 pg (ref 26.0–34.0)
MCHC: 33.3 g/dL (ref 30.0–36.0)
MCV: 84.7 fL (ref 78.0–100.0)
Platelets: 267 10*3/uL (ref 150–400)
RBC: 5.1 MIL/uL (ref 4.22–5.81)
RDW: 14.1 % (ref 11.5–15.5)
WBC: 15.6 10*3/uL — ABNORMAL HIGH (ref 4.0–10.5)

## 2015-01-01 LAB — BASIC METABOLIC PANEL
ANION GAP: 10 (ref 5–15)
BUN: 17 mg/dL (ref 6–20)
CO2: 26 mmol/L (ref 22–32)
Calcium: 8.7 mg/dL — ABNORMAL LOW (ref 8.9–10.3)
Chloride: 102 mmol/L (ref 101–111)
Creatinine, Ser: 1.33 mg/dL — ABNORMAL HIGH (ref 0.61–1.24)
GLUCOSE: 115 mg/dL — AB (ref 65–99)
POTASSIUM: 3.9 mmol/L (ref 3.5–5.1)
Sodium: 138 mmol/L (ref 135–145)

## 2015-01-01 NOTE — Discharge Instructions (Signed)

## 2015-01-01 NOTE — Progress Notes (Signed)
1 Day Post-Op  Subjective: No complaints  No flatus  Objective: Vital signs in last 24 hours: Temp:  [97.4 F (36.3 C)-98.7 F (37.1 C)] 97.4 F (36.3 C) (12/16 0900) Pulse Rate:  [67-83] 69 (12/16 0900) Resp:  [13-17] 17 (12/16 0900) BP: (116-137)/(71-92) 121/80 mmHg (12/16 0900) SpO2:  [91 %-100 %] 99 % (12/16 0900) Weight:  [117.6 kg (259 lb 4.2 oz)-117.845 kg (259 lb 12.8 oz)] 117.6 kg (259 lb 4.2 oz) (12/15 2021)    Intake/Output from previous day: 12/15 0701 - 12/16 0700 In: 2750 [I.V.:2750] Out: 525 [Urine:500; Blood:25] Intake/Output this shift: Total I/O In: 270 [P.O.:270] Out: -   Incision/Wound:RLQ wound with wicks serous drainage  Lab Results:   Recent Labs  12/31/14 1148 01/01/15 0730  WBC 13.5* 15.6*  HGB 13.2 14.4  HCT 40.1 43.2  PLT 205 267   BMET  Recent Labs  12/31/14 1148 01/01/15 0730  NA  --  138  K  --  3.9  CL  --  102  CO2  --  26  GLUCOSE  --  115*  BUN  --  17  CREATININE 1.52* 1.33*  CALCIUM  --  8.7*   PT/INR No results for input(s): LABPROT, INR in the last 72 hours. ABG No results for input(s): PHART, HCO3 in the last 72 hours.  Invalid input(s): PCO2, PO2  Studies/Results: No results found.  Anti-infectives: Anti-infectives    Start     Dose/Rate Route Frequency Ordered Stop   12/31/14 1930  cefoTEtan (CEFOTAN) 2 g in dextrose 5 % 50 mL IVPB     2 g 100 mL/hr over 30 Minutes Intravenous Every 12 hours 12/31/14 1037 12/31/14 2030   12/31/14 0700  cefoTEtan in Dextrose 5% (CEFOTAN) IVPB 2 g     2 g Intravenous To ShortStay Surgical 12/30/14 1319 12/31/14 0738      Assessment/Plan: s/p Procedure(s): ILEOSTOMY CLOSURE (N/A) Advance diet  OOB   Once having BM and tolerating diet can go home   LOS: 1 day    Jirah Rider A. 01/01/2015

## 2015-01-01 NOTE — Progress Notes (Signed)
Pt noted to have increased blood on dressing. Changed dressing per MD order. Notified MD of increased blood on dressing. MD at bedside, no new orders will continue to monitor.    Ok AnisSanders,Adeeb Konecny A, RN 01/01/2015

## 2015-01-01 NOTE — Progress Notes (Signed)
Called with concern for bleeding from incision.  He has some wicks that are present.  I pulled one with the dressing.  I cleaned up the site and redressed.  No active bleeding.  I redressed and we will see him tomorrow. I told them to call if they had further concerns.

## 2015-01-02 NOTE — Progress Notes (Signed)
2 Days Post-Op  Subjective: Comfortable Denies nausea No flatus yet  Objective: Vital signs in last 24 hours: Temp:  [98.4 F (36.9 C)-98.6 F (37 C)] 98.6 F (37 C) (12/17 0420) Pulse Rate:  [79-89] 89 (12/17 0420) Resp:  [17-18] 17 (12/17 0420) BP: (123-129)/(83-86) 123/86 mmHg (12/17 0420) SpO2:  [96 %-97 %] 96 % (12/17 0420)    Intake/Output from previous day: 12/16 0701 - 12/17 0700 In: 3770 [P.O.:1570; I.V.:2200] Out: -  Intake/Output this shift: Total I/O In: 495.8 [I.V.:495.8] Out: -   EXAM: Looks good Abdomen soft, obese, non distended, minimally tender, dressing dry  Lab Results:   Recent Labs  12/31/14 1148 01/01/15 0730  WBC 13.5* 15.6*  HGB 13.2 14.4  HCT 40.1 43.2  PLT 205 267   BMET  Recent Labs  12/31/14 1148 01/01/15 0730  NA  --  138  K  --  3.9  CL  --  102  CO2  --  26  GLUCOSE  --  115*  BUN  --  17  CREATININE 1.52* 1.33*  CALCIUM  --  8.7*   PT/INR No results for input(s): LABPROT, INR in the last 72 hours. ABG No results for input(s): PHART, HCO3 in the last 72 hours.  Invalid input(s): PCO2, PO2  Studies/Results: No results found.  Anti-infectives: Anti-infectives    Start     Dose/Rate Route Frequency Ordered Stop   12/31/14 1930  cefoTEtan (CEFOTAN) 2 g in dextrose 5 % 50 mL IVPB     2 g 100 mL/hr over 30 Minutes Intravenous Every 12 hours 12/31/14 1037 12/31/14 2030   12/31/14 0700  cefoTEtan in Dextrose 5% (CEFOTAN) IVPB 2 g     2 g Intravenous To ShortStay Surgical 12/30/14 1319 12/31/14 0738      Assessment/Plan: s/p Procedure(s): ILEOSTOMY CLOSURE (N/A)  Ambulate Advancing diet as tolerated  LOS: 2 days    Kiaan Overholser A 01/02/2015

## 2015-01-03 NOTE — Progress Notes (Signed)
  Progress Note: General Surgery Service   Subjective: No nausea, some bloating. Pain controlled. No flatus or BM yet.  Objective: Vital signs in last 24 hours: Temp:  [98.2 F (36.8 C)-98.9 F (37.2 C)] 98.2 F (36.8 C) (12/18 1027) Pulse Rate:  [69-88] 84 (12/18 1027) Resp:  [16-18] 17 (12/18 1027) BP: (123-136)/(78-85) 135/85 mmHg (12/18 1027) SpO2:  [96 %-98 %] 98 % (12/18 1027) Weight:  [118.1 kg (260 lb 5.8 oz)] 118.1 kg (260 lb 5.8 oz) (12/17 2100)    Intake/Output from previous day: 12/17 0701 - 12/18 0700 In: 2304.2 [P.O.:720; I.V.:1584.2] Out: 2225 [Urine:2225] Intake/Output this shift: Total I/O In: 900 [P.O.:900] Out: -   Lungs: CTAB  Cardiovascular: RRR  Abd: soft, slight distension  Extremities: no edema  Neuro: AOx4  Lab Results: CBC   Recent Labs  12/31/14 1148 01/01/15 0730  WBC 13.5* 15.6*  HGB 13.2 14.4  HCT 40.1 43.2  PLT 205 267   BMET  Recent Labs  12/31/14 1148 01/01/15 0730  NA  --  138  K  --  3.9  CL  --  102  CO2  --  26  GLUCOSE  --  115*  BUN  --  17  CREATININE 1.52* 1.33*  CALCIUM  --  8.7*   PT/INR No results for input(s): LABPROT, INR in the last 72 hours. ABG No results for input(s): PHART, HCO3 in the last 72 hours.  Invalid input(s): PCO2, PO2  Studies/Results:  Anti-infectives: Anti-infectives    Start     Dose/Rate Route Frequency Ordered Stop   12/31/14 1930  cefoTEtan (CEFOTAN) 2 g in dextrose 5 % 50 mL IVPB     2 g 100 mL/hr over 30 Minutes Intravenous Every 12 hours 12/31/14 1037 12/31/14 2030   12/31/14 0700  cefoTEtan in Dextrose 5% (CEFOTAN) IVPB 2 g     2 g Intravenous To ShortStay Surgical 12/30/14 1319 12/31/14 0738      Medications: Scheduled Meds: . enoxaparin (LOVENOX) injection  40 mg Subcutaneous Q24H   Continuous Infusions: . dextrose 5 % and 0.9 % NaCl with KCl 20 mEq/L 50 mL/hr at 01/02/15 1758   PRN Meds:.HYDROmorphone (DILAUDID) injection, ondansetron **OR** ondansetron  (ZOFRAN) IV, oxyCODONE, simethicone  Assessment/Plan: Patient Active Problem List   Diagnosis Date Noted  . Ileostomy in place [facility]) 12/31/2014  . Diverticulitis 09/02/2014  . Diverticulitis of colon with perforation 08/21/2014  . Acute diverticulitis 07/30/2014  . Traumatic hematoma of thigh 03/19/2012   s/p Procedure(s): ILEOSTOMY CLOSURE 12/31/2014. Overall doing well, no flatus yet -continue sips/ice chips -ambulate -await return bowel function   LOS: 3 days   Rodman PickleLuke Aaron Kyndal Gloster, MD Pg# [phone] [facility]

## 2015-01-03 NOTE — Progress Notes (Signed)
Utilization Review Completed.Vannie Hilgert T12/18/2016  

## 2015-01-04 MED ORDER — POLYETHYLENE GLYCOL 3350 17 G PO PACK
17.0000 g | PACK | Freq: Every day | ORAL | Status: DC
  Administered 2015-01-04 – 2015-01-05 (×2): 17 g via ORAL
  Filled 2015-01-04 (×2): qty 1

## 2015-01-04 MED ORDER — HYDROMORPHONE HCL 1 MG/ML IJ SOLN
1.0000 mg | INTRAMUSCULAR | Status: DC | PRN
  Administered 2015-01-05 – 2015-01-06 (×6): 1 mg via INTRAVENOUS
  Filled 2015-01-04 (×6): qty 1

## 2015-01-04 NOTE — Care Management Note (Signed)
Case Management Note  Patient Details  Name: Bryan [name] MRN: [id number] Date of Birth: 07/08/1970  Subjective/Objective:       CM following for progression and d/c planning.             Action/Plan:   01/04/2015      No HH needs identified at this time will continue to follow.  Expected Discharge Date:       01/07/2015           Expected Discharge Plan:  Home/Self Care  In-House Referral:  NA  Discharge planning Services  CM Consult  Post Acute Care Choice:  NA Choice offered to:     DME Arranged:    DME Agency:     HH Arranged:    HH Agency:     Status of Service:  In process, will continue to follow  Medicare Important Message Given:    Date Medicare IM Given:    Medicare IM give by:    Date Additional Medicare IM Given:    Additional Medicare Important Message give by:     If discussed at Long Length of Stay Meetings, dates discussed:    Additional Comments:  Starlyn SkeansRoyal, Lucille Witts U, RN 01/04/2015, 3:45 PM

## 2015-01-04 NOTE — Progress Notes (Signed)
4 Days Post-Op  Subjective: PASSED GAS SMALL BM   Objective: Vital signs in last 24 hours: Temp:  [98.2 F (36.8 C)-98.7 F (37.1 C)] 98.3 F (36.8 C) (12/19 0918) Pulse Rate:  [70-84] 75 (12/19 0918) Resp:  [16-18] 16 (12/19 0918) BP: (129-135)/(77-85) 134/77 mmHg (12/19 0918) SpO2:  [95 %-99 %] 98 % (12/19 0918) Last BM Date: 01/04/15  Intake/Output from previous day: 12/18 0701 - 12/19 0700 In: 2235.8 [P.O.:1020; I.V.:1215.8] Out: 400 [Urine:400] Intake/Output this shift: Total I/O In: 120 [P.O.:120] Out: -   Incision/Wound:WICKS REMOVED   SOFT nt  SLIGHT DISTETION    Lab Results:  No results for input(s): WBC, HGB, HCT, PLT in the last 72 hours. BMET No results for input(s): NA, K, CL, CO2, GLUCOSE, BUN, CREATININE, CALCIUM in the last 72 hours. PT/INR No results for input(s): LABPROT, INR in the last 72 hours. ABG No results for input(s): PHART, HCO3 in the last 72 hours.  Invalid input(s): PCO2, PO2  Studies/Results: No results found.  Anti-infectives: Anti-infectives    Start     Dose/Rate Route Frequency Ordered Stop   12/31/14 1930  cefoTEtan (CEFOTAN) 2 g in dextrose 5 % 50 mL IVPB     2 g 100 mL/hr over 30 Minutes Intravenous Every 12 hours 12/31/14 1037 12/31/14 2030   12/31/14 0700  cefoTEtan in Dextrose 5% (CEFOTAN) IVPB 2 g     2 g Intravenous To ShortStay Surgical 12/30/14 1319 12/31/14 0738      Assessment/Plan: s/p Procedure(s): ILEOSTOMY CLOSURE (N/A) Keep on full liquids   OOB SL IV   LOS: 4 days    Bryan [name] A. 01/04/2015

## 2015-01-05 MED ORDER — METOCLOPRAMIDE HCL 10 MG PO TABS
10.0000 mg | ORAL_TABLET | Freq: Three times a day (TID) | ORAL | Status: DC
  Administered 2015-01-05 – 2015-01-06 (×4): 10 mg via ORAL
  Filled 2015-01-05 (×5): qty 1

## 2015-01-05 NOTE — Progress Notes (Signed)
5 Days Post-Op  Subjective: Passing gas  Small brown BM  NO VOMITING  SOME OCCASIONAL NAUSEA   Objective: Vital signs in last 24 hours: Temp:  [98.2 F (36.8 C)-98.9 F (37.2 C)] 98.2 F (36.8 C) (12/20 0616) Pulse Rate:  [71-82] 71 (12/20 0616) Resp:  [15-18] 15 (12/20 0616) BP: (124-146)/(74-88) 124/74 mmHg (12/20 0616) SpO2:  [98 %] 98 % (12/20 0616) Weight:  [121.11 kg (267 lb)] 121.11 kg (267 lb) (12/19 2121) Last BM Date: 01/04/15  Intake/Output from previous day: 12/19 0701 - 12/20 0700 In: 965.8 [P.O.:720; I.V.:245.8] Out: -  Intake/Output this shift:    Incision/Wound:RLQ  WICKS OUT  BS PRESENT OBESE MILD DISTENTION  SOFT   Lab Results:  No results for input(s): WBC, HGB, HCT, PLT in the last 72 hours. BMET No results for input(s): NA, K, CL, CO2, GLUCOSE, BUN, CREATININE, CALCIUM in the last 72 hours. PT/INR No results for input(s): LABPROT, INR in the last 72 hours. ABG No results for input(s): PHART, HCO3 in the last 72 hours.  Invalid input(s): PCO2, PO2  Studies/Results: No results found.  Anti-infectives: Anti-infectives    Start     Dose/Rate Route Frequency Ordered Stop   12/31/14 1930  cefoTEtan (CEFOTAN) 2 g in dextrose 5 % 50 mL IVPB     2 g 100 mL/hr over 30 Minutes Intravenous Every 12 hours 12/31/14 1037 12/31/14 2030   12/31/14 0700  cefoTEtan in Dextrose 5% (CEFOTAN) IVPB 2 g     2 g Intravenous To ShortStay Surgical 12/30/14 1319 12/31/14 0738      Assessment/Plan: s/p Procedure(s): ILEOSTOMY CLOSURE (N/A) Advance diet to soft Cut back narcotics OOB Add reglan   LOS: 5 days    Bryan [name] A. 01/05/2015

## 2015-01-06 MED ORDER — OXYCODONE HCL 5 MG PO TABS: 5.0000 mg | ORAL_TABLET | ORAL | Status: DC | PRN

## 2015-01-06 MED ORDER — POLYETHYLENE GLYCOL 3350 17 G PO PACK: 17.0000 g | PACK | Freq: Every day | ORAL | Status: DC

## 2015-01-06 MED ORDER — ONDANSETRON 4 MG PO TBDP: 4.0000 mg | ORAL_TABLET | Freq: Four times a day (QID) | ORAL | Status: DC | PRN

## 2015-01-06 NOTE — Progress Notes (Signed)
6 Days Post-Op  Subjective: Tolerating diet  Bowels working  Objective: Vital signs in last 24 hours: Temp:  [98.2 F (36.8 C)-98.7 F (37.1 C)] 98.2 F (36.8 C) (12/21 0530) Pulse Rate:  [62-70] 67 (12/21 0530) Resp:  [16-20] 18 (12/21 0530) BP: (120-129)/(77-84) 129/84 mmHg (12/21 0530) SpO2:  [97 %-100 %] 97 % (12/21 0530) Weight:  [119.296 kg (263 lb)] 119.296 kg (263 lb) (12/20 2037) Last BM Date: 01/05/15  Intake/Output from previous day: 12/20 0701 - 12/21 0700 In: 822 [P.O.:822] Out: 0  Intake/Output this shift: Total I/O In: 222 [P.O.:222] Out: 0   Incision/Wound:staples RLQ   Serous drainage  Soft NT ABDOMEN  Lab Results:  No results for input(s): WBC, HGB, HCT, PLT in the last 72 hours. BMET No results for input(s): NA, K, CL, CO2, GLUCOSE, BUN, CREATININE, CALCIUM in the last 72 hours. PT/INR No results for input(s): LABPROT, INR in the last 72 hours. ABG No results for input(s): PHART, HCO3 in the last 72 hours.  Invalid input(s): PCO2, PO2  Studies/Results: No results found.  Anti-infectives: Anti-infectives    Start     Dose/Rate Route Frequency Ordered Stop   12/31/14 1930  cefoTEtan (CEFOTAN) 2 g in dextrose 5 % 50 mL IVPB     2 g 100 mL/hr over 30 Minutes Intravenous Every 12 hours 12/31/14 1037 12/31/14 2030   12/31/14 0700  cefoTEtan in Dextrose 5% (CEFOTAN) IVPB 2 g     2 g Intravenous To [facility] 12/30/14 1319 12/31/14 0738      Assessment/Plan: s/p Procedure(s): ILEOSTOMY CLOSURE (N/A) Discharge  LOS: 6 days    Adeline Petitfrere A. 01/06/2015

## 2015-01-06 NOTE — Progress Notes (Signed)
Discharge instructions and medications discussed with patient.   Prescription given to patient.  Patient verbalized understanding of how to do dressing change & when to call MD.  All questions answered.

## 2015-01-06 NOTE — Discharge Summary (Signed)
Physician Discharge Summary  Patient ID: Bryan LeschChristopher J Delaine MRN: [id number] DOB/AGE: 46/09/1970 46 y.o.  Admit date: 12/31/2014 Discharge date: 01/06/2015  Admission Diagnoses:ILEOSTOMY IN PLACE   Discharge Diagnoses:  Active Problems:   Ileostomy in place [facility](HCC)   Discharged Condition: good  Hospital Course: Pt had slow return of bowel function.  Bowels moving and wound clean.  No fever or chills.  Abdomen  Soft NT otherwise.  Discharge home on soft diet for 2 weeks   Consults: None    Treatments: surgery: ileostomy closure  Discharge Exam: Blood pressure 129/84, pulse 67, temperature 98.2 F (36.8 C), temperature source Oral, resp. rate 18, height 5\' 10"  (1.778 m), weight 119.296 kg (263 lb), SpO2 97 %. General appearance: alert and cooperative Incision/Wound:soft  Sore around the incision  RLQ NO PERITONITIS  SEROUS DRAINAGE FROM STAPLE LINE   Disposition: 01-Home or Self Care  Discharge Instructions    Diet - low sodium heart healthy    Complete by:  As directed      Increase activity slowly    Complete by:  As directed             Medication List    TAKE these medications        acetaminophen 325 MG tablet  Commonly known as:  TYLENOL  Take 2 tablets (650 mg total) by mouth every 6 (six) hours as needed for mild pain (or temp > 100).     minocycline 50 MG capsule  Commonly known as:  MINOCIN,DYNACIN  Take 50 mg by mouth 2 (two) times daily.     ondansetron 4 MG disintegrating tablet  Commonly known as:  ZOFRAN-ODT  Take 1 tablet (4 mg total) by mouth every 6 (six) hours as needed for nausea.     oxyCODONE 5 MG immediate release tablet  Commonly known as:  Oxy IR/ROXICODONE  Take 1-2 tablets (5-10 mg total) by mouth every 4 (four) hours as needed for moderate pain.     polyethylene glycol packet  Commonly known as:  MIRALAX / GLYCOLAX  Take 17 g by mouth daily.           Follow-up Information    Follow up with Dortha SchwalbeORNETT,Lowen Mansouri A., MD.   Specialty:  General Surgery   Contact information:   [address] [place] [address] [phone]       Signed: Dortha SchwalbeCORNETT,Deanette Tullius A. 01/06/2015, 6:27 AM

## 2015-06-30 ENCOUNTER — Other Ambulatory Visit: Payer: Self-pay | Admitting: Surgery

## 2015-06-30 DIAGNOSIS — K432 Incisional hernia without obstruction or gangrene: Secondary | ICD-10-CM

## 2015-07-09 ENCOUNTER — Other Ambulatory Visit: Payer: BLUE CROSS/BLUE SHIELD

## 2015-07-16 ENCOUNTER — Ambulatory Visit
Admission: RE | Admit: 2015-07-16 | Discharge: 2015-07-16 | Disposition: A | Discharge status: Home / Self Care | Payer: BLUE CROSS/BLUE SHIELD | Source: Ambulatory Visit | Attending: Surgery | Admitting: Surgery

## 2015-07-16 DIAGNOSIS — K432 Incisional hernia without obstruction or gangrene: Secondary | ICD-10-CM

## 2015-07-16 MED ORDER — IOPAMIDOL (ISOVUE-300) INJECTION 61%
125.0000 mL | Freq: Once | INTRAVENOUS | Status: AC | PRN
  Administered 2015-07-16: 125 mL via INTRAVENOUS

## 2015-07-19 ENCOUNTER — Ambulatory Visit: Payer: Self-pay | Admitting: Surgery

## 2015-10-06 ENCOUNTER — Ambulatory Visit: Payer: Self-pay | Admitting: Surgery

## 2015-10-06 NOTE — H&P (Signed)
Bryan LeschChristopher J. [name] 10/06/2015 2:30 PM Location: [facility] Patient #: [id number] DOB: 02/09/1970 Single / Language: [place] / Race: White Male  History of Present Illness Bryan [name](Bryan Say A. Ludy Messamore MD; 10/06/2015 3:25 PM) Patient words: Patient returns for follow-up of his incisional hernia. He had developed an incisional hernia at his loop ileostomy site. He returns today to set up surgery for next month. He has had no change in his symptoms. He denies any significant abdominal pain but does notice the bulge in his right lower quadrant. Denies nausea or vomiting. Bowel function is normal.  The patient is a 45 year old male.   Allergies Bryan [name](Bryan [name], [place]; 10/06/2015 2:31 PM) No Known Drug Allergies 09/14/2014  Medication History Bryan [name](Bryan [name], [place]; 10/06/2015 2:31 PM) Minocycline HCl (50MG  Capsule, Oral) Active. Medications Reconciled    Vitals Bryan [name](Bryan [name] CMA; 10/06/2015 2:31 PM) 10/06/2015 2:31 PM Weight: 277 lb Height: 70in Body Surface Area: 2.4 m Body Mass Index: 39.74 kg/m  Temp.: 97.55F(Temporal)  Pulse: 90 (Regular)  BP: 130/80 (Sitting, Left Arm, Standard)      Physical Exam (Bryan Encarnacion A. Abbegail Matuska MD; 10/06/2015 3:26 PM)  General Mental Status-Alert. General Appearance-Consistent with stated age. Hydration-Well hydrated. Voice-Normal.  Head and Neck Head-normocephalic, atraumatic with no lesions or palpable masses. Trachea-midline. Thyroid Gland Characteristics - normal size and consistency.  Chest and Lung Exam Chest and lung exam reveals -quiet, even and easy respiratory effort with no use of accessory muscles and on auscultation, normal breath sounds, no adventitious sounds and normal vocal resonance. Inspection Chest Wall - Normal. Back - normal.  Cardiovascular Cardiovascular examination reveals -normal heart sounds, regular rate and rhythm with no murmurs and normal pedal pulses bilaterally.  Abdomen Note:  Reducible right lower quadrant incisional hernia at previous loop ileostomy site. Umbilical hernia noted as well just medial to that.  Neurologic Neurologic evaluation reveals -alert and oriented x 3 with no impairment of recent or remote memory. Mental Status-Normal.    Assessment & Plan (Bryan Huffaker A. Shawnette Augello MD; 10/06/2015 2:59 PM)  INCISIONAL HERNIA OF ANTERIOR ABDOMINAL WALL WITHOUT OBSTRUCTION OR GANGRENE (K43.2) Impression: Small incisional hernia had previous ileostomy site extending to the umbilicus. Discussed laparoscopic and open repairs with mesh. Discussed the pros and cons of each and he has chosen laparoscopic repair after discussion. The risk of hernia repair include bleeding, infection, organ injury, bowel injury, bladder injury, nerve injury recurrent hernia, blood clots, worsening of underlying condition, chronic pain, mesh use, open surgery, death, and the need for other operattions. Pt agrees to proceed  Current Plans Pt Education - Pamphlet Given - Hernia Surgery: discussed with patient and provided information. You are being scheduled for surgery - Our schedulers will call you.  You should hear from our office's scheduling department within 5 working days about the location, date, and time of surgery. We try to make accommodations for patient's preferences in scheduling surgery, but sometimes the OR schedule or the surgeon's schedule prevents us from making those accommodations.  If you have not heard from our office [phone]) in 5 working days, call the office and ask for your surgeon's nurse.  If you have other questions about your diagnosis, plan, or surgery, call the office and ask for your surgeon's nurse.  The anatomy & physiology of the abdominal wall was discussed. The pathophysiology of hernias was discussed. Natural history risks without surgery including progeressive enlargement, pain, incarceration, & strangulation was discussed. Contributors to  complications such as smoking, obesity, diabetes, prior surgery, etc were discussed.  I feel the risks of no intervention will lead to serious problems that outweigh the operative risks; therefore, I recommended surgery to reduce and repair the hernia. I explained laparoscopic techniques with possible need for an open approach. I noted the probable use of mesh to patch and/or buttress the hernia repair  Risks such as bleeding, infection, abscess, need for further treatment, heart attack, death, and other risks were discussed. I noted a good likelihood this will help address the problem. Goals of post-operative recovery were discussed as well. Possibility that this will not correct all symptoms was explained. I stressed the importance of low-impact activity, aggressive pain control, avoiding constipation, & not pushing through pain to minimize risk of post-operative chronic pain or injury. Possibility of reherniation especially with smoking, obesity, diabetes, immunosuppression, and other health conditions was discussed. We will work to minimize complications.  An educational handout further explaining the pathology & treatment options was given as well. Questions were answered. The patient expresses understanding & wishes to proceed with surgery.  The anatomy & physiology of the abdominal wall was discussed. The pathophysiology of hernias was discussed. Natural history risks without surgery including progeressive enlargement, pain, incarceration, & strangulation was discussed. Contributors to complications such as smoking, obesity, diabetes, prior surgery, etc were discussed.  I feel the risks of no intervention will lead to serious problems that outweigh the operative risks; therefore, I recommended surgery to reduce and repair the hernia. I explained laparoscopic techniques with possible need for an open approach. I noted the probable use of mesh to patch and/or buttress the hernia repair  Risks such  as bleeding, infection, abscess, need for further treatment, heart attack, death, and other risks were discussed. I noted a good likelihood this will help address the problem. Goals of post-operative recovery were discussed as well. Possibility that this will not correct all symptoms was explained. I stressed the importance of low-impact activity, aggressive pain control, avoiding constipation, & not pushing through pain to minimize risk of post-operative chronic pain or injury. Possibility of reherniation especially with smoking, obesity, diabetes, immunosuppression, and other health conditions was discussed. We will work to minimize complications.  An educational handout further explaining the pathology & treatment options was given as well. Questions were answered. The patient expresses understanding & wishes to proceed with surgery.

## 2015-10-26 ENCOUNTER — Other Ambulatory Visit: Payer: Self-pay

## 2015-10-26 ENCOUNTER — Encounter (HOSPITAL_COMMUNITY): Payer: Self-pay

## 2015-10-26 ENCOUNTER — Encounter (HOSPITAL_COMMUNITY)
Admission: RE | Admit: 2015-10-26 | Discharge: 2015-10-26 | Disposition: A | Discharge status: Home / Self Care | Payer: BLUE CROSS/BLUE SHIELD | Source: Ambulatory Visit | Attending: Surgery | Admitting: Surgery

## 2015-10-26 DIAGNOSIS — Z01812 Encounter for preprocedural laboratory examination: Secondary | ICD-10-CM | POA: Insufficient documentation

## 2015-10-26 DIAGNOSIS — K432 Incisional hernia without obstruction or gangrene: Secondary | ICD-10-CM | POA: Diagnosis not present

## 2015-10-26 DIAGNOSIS — R030 Elevated blood-pressure reading, without diagnosis of hypertension: Secondary | ICD-10-CM | POA: Diagnosis not present

## 2015-10-26 DIAGNOSIS — Z01810 Encounter for preprocedural cardiovascular examination: Secondary | ICD-10-CM | POA: Insufficient documentation

## 2015-10-26 LAB — CBC WITH DIFFERENTIAL/PLATELET
BASOS ABS: 0.1 10*3/uL (ref 0.0–0.1)
Basophils Relative: 1 %
Eosinophils Absolute: 0.3 10*3/uL (ref 0.0–0.7)
Eosinophils Relative: 3 %
HEMATOCRIT: 43.4 % (ref 39.0–52.0)
Hemoglobin: 14.1 g/dL (ref 13.0–17.0)
LYMPHS ABS: 1.9 10*3/uL (ref 0.7–4.0)
Lymphocytes Relative: 22 %
MCH: 27 pg (ref 26.0–34.0)
MCHC: 32.5 g/dL (ref 30.0–36.0)
MCV: 83.1 fL (ref 78.0–100.0)
MONO ABS: 0.6 10*3/uL (ref 0.1–1.0)
MONOS PCT: 7 %
Neutro Abs: 5.9 10*3/uL (ref 1.7–7.7)
Neutrophils Relative %: 67 %
PLATELETS: 231 10*3/uL (ref 150–400)
RBC: 5.22 MIL/uL (ref 4.22–5.81)
RDW: 14 % (ref 11.5–15.5)
WBC: 8.8 10*3/uL (ref 4.0–10.5)

## 2015-10-26 LAB — COMPREHENSIVE METABOLIC PANEL
ALBUMIN: 3.9 g/dL (ref 3.5–5.0)
ALT: 26 U/L (ref 17–63)
AST: 23 U/L (ref 15–41)
Alkaline Phosphatase: 71 U/L (ref 38–126)
Anion gap: 11 (ref 5–15)
BUN: 21 mg/dL — AB (ref 6–20)
CHLORIDE: 108 mmol/L (ref 101–111)
CO2: 21 mmol/L — ABNORMAL LOW (ref 22–32)
Calcium: 9.5 mg/dL (ref 8.9–10.3)
Creatinine, Ser: 1.32 mg/dL — ABNORMAL HIGH (ref 0.61–1.24)
GFR calc Af Amer: >60 mL/min (ref >60)
GFR calc non Af Amer: >60 mL/min (ref >60)
GLUCOSE: 114 mg/dL — AB (ref 65–99)
POTASSIUM: 3.9 mmol/L (ref 3.5–5.1)
Sodium: 140 mmol/L (ref 135–145)
Total Bilirubin: 0.4 mg/dL (ref 0.3–1.2)
Total Protein: 7.3 g/dL (ref 6.5–8.1)

## 2015-10-26 NOTE — Progress Notes (Signed)
Call from the lab, stating that the blood available in the lavendar tube is minimal for Diff. to be run.  They will try to complete the Diff with the blood available.

## 2015-10-26 NOTE — Progress Notes (Signed)
Mr Bryan [name] denies chast pain or shortness of breath.  Mr Blood Pressure on arrival to PAT was 155/94.  Mr Bryan [name] reports that blood pressure has been elevated a few times at the doctors office. Patient is not on medication for blood pressure. Recheck of blood pressure was 150/100.

## 2015-10-26 NOTE — Pre-Procedure Instructions (Signed)
    Bryan [name]  10/26/2015    Your procedure is scheduled on Tuesday, October 17  [facility] at 10:15 AM                 Your surgery or procedure is scheduled for 12:15 PM   Call this number if you have problems the morning of surgery:[phone]                   For any other questions, please call [phone], Monday - Friday 8 AM - 4 PM.  Remember:  Do not eat food or drink liquids after midnight Monday, October 16.  Take these medicines the morning of surgery with A SIP OF WATER : none   Do not wear jewelry, make-up or nail polish.  Do not wear lotions, powders, or perfumes, or deodorant.  Do not shave 48 hours prior to surgery.    Do not bring valuables to the hospital.  [facility] not [facility] for any belongings or valuables.  Contacts, dentures or bridgework may not be worn into surgery.  Leave your suitcase in the car.  After surgery it may be brought to your room.  For patients admitted to the hospital, discharge time will be determined by your treatment team.  Patients discharged the day of surgery will not be allowed to drive home.   Name and phone number of your driver:     Special instructions:Review  [place] - Preparing For Surgery.  Please read over the following fact sheets that you were given: [facility]- Pre[facility] For [facility], Coughing and Deep Breathing, Pain Booklet.

## 2015-10-27 NOTE — Progress Notes (Signed)
Anesthesia Chart Review:  Pt is a 45 year old male scheduled for laparoscopic incisional hernia repair, insertion of mesh on 11/02/2015 with Harriette Bouillonhomas Cornett, MD.   PMH includes:  Diverticulitis with perforation. Never smoker. BMI 39. S/p ileostomy closure 12/31/14. S/p partial colectomy 08/26/14.   BP at PAT was 164/104, on recheck it was 155/94. Pt does not have diagnosis of HTN and previous BP measures from last December were normal. H&P for this surgery by Dr. Luisa Hartornett documents pt's BP was 130/80 on 10/06/15.   Preoperative labs reviewed.   EKG 10/26/15: NSR. Early R wave progression.   If BP acceptable DOS, I anticipate pt can proceed as scheduled.   Rica Mastngela Kabbe, FNP-BC [facility]Anesthesiology Phone: [phone] 10/27/2015 1:44 PM

## 2015-11-01 MED ORDER — CEFAZOLIN SODIUM 10 G IJ SOLR
3.0000 g | INTRAMUSCULAR | Status: AC
  Administered 2015-11-02: 3 g via INTRAVENOUS
  Filled 2015-11-01: qty 3000

## 2015-11-02 ENCOUNTER — Ambulatory Visit (HOSPITAL_COMMUNITY): Payer: BLUE CROSS/BLUE SHIELD | Admitting: Anesthesiology

## 2015-11-02 ENCOUNTER — Ambulatory Visit (HOSPITAL_COMMUNITY): Payer: BLUE CROSS/BLUE SHIELD | Admitting: Emergency Medicine

## 2015-11-02 ENCOUNTER — Observation Stay (HOSPITAL_COMMUNITY)
Admission: RE | Admit: 2015-11-02 | Discharge: 2015-11-04 | DRG: 355 | Disposition: A | Discharge status: Home / Self Care | Payer: BLUE CROSS/BLUE SHIELD | Source: Ambulatory Visit | Attending: Surgery | Admitting: Surgery

## 2015-11-02 ENCOUNTER — Encounter (HOSPITAL_COMMUNITY)
Admission: RE | Disposition: A | Discharge status: Home / Self Care | Payer: Self-pay | Source: Ambulatory Visit | Attending: Surgery

## 2015-11-02 ENCOUNTER — Encounter (HOSPITAL_COMMUNITY): Payer: Self-pay | Admitting: *Deleted

## 2015-11-02 DIAGNOSIS — Z9049 Acquired absence of other specified parts of digestive tract: Secondary | ICD-10-CM | POA: Diagnosis not present

## 2015-11-02 DIAGNOSIS — Z6839 Body mass index (BMI) 39.0-39.9, adult: Secondary | ICD-10-CM | POA: Diagnosis not present

## 2015-11-02 DIAGNOSIS — Z23 Encounter for immunization: Secondary | ICD-10-CM | POA: Diagnosis not present

## 2015-11-02 DIAGNOSIS — K432 Incisional hernia without obstruction or gangrene: Principal | ICD-10-CM | POA: Diagnosis present

## 2015-11-02 DIAGNOSIS — Z792 Long term (current) use of antibiotics: Secondary | ICD-10-CM | POA: Diagnosis not present

## 2015-11-02 HISTORY — PX: INSERTION OF MESH: SHX5868

## 2015-11-02 HISTORY — PX: INCISIONAL HERNIA REPAIR: SHX193

## 2015-11-02 LAB — CBC
HEMATOCRIT: 39.9 % (ref 39.0–52.0)
Hemoglobin: 13.2 g/dL (ref 13.0–17.0)
MCH: 28 pg (ref 26.0–34.0)
MCHC: 33.1 g/dL (ref 30.0–36.0)
MCV: 84.5 fL (ref 78.0–100.0)
Platelets: 244 10*3/uL (ref 150–400)
RBC: 4.72 MIL/uL (ref 4.22–5.81)
RDW: 14.2 % (ref 11.5–15.5)
WBC: 14.4 10*3/uL — ABNORMAL HIGH (ref 4.0–10.5)

## 2015-11-02 LAB — CREATININE, SERUM
Creatinine, Ser: 1.66 mg/dL — ABNORMAL HIGH (ref 0.61–1.24)
GFR calc Af Amer: 55 mL/min — ABNORMAL LOW (ref >60)
GFR calc non Af Amer: 48 mL/min — ABNORMAL LOW (ref >60)

## 2015-11-02 SURGERY — REPAIR, HERNIA, INCISIONAL, LAPAROSCOPIC
Anesthesia: General | Site: Abdomen

## 2015-11-02 MED ORDER — HYDROMORPHONE HCL 1 MG/ML IJ SOLN: 0.2500 mg | INTRAMUSCULAR | Status: DC | PRN

## 2015-11-02 MED ORDER — POLYETHYLENE GLYCOL 3350 17 G PO PACK
17.0000 g | PACK | Freq: Every day | ORAL | Status: DC
  Administered 2015-11-03: 17 g via ORAL
  Filled 2015-11-02: qty 1

## 2015-11-02 MED ORDER — PROMETHAZINE HCL 25 MG/ML IJ SOLN
INTRAMUSCULAR | Status: AC
  Filled 2015-11-02: qty 1

## 2015-11-02 MED ORDER — FENTANYL CITRATE (PF) 100 MCG/2ML IJ SOLN
INTRAMUSCULAR | Status: AC
  Filled 2015-11-02: qty 4

## 2015-11-02 MED ORDER — 0.9 % SODIUM CHLORIDE (POUR BTL) OPTIME
TOPICAL | Status: DC | PRN
  Administered 2015-11-02: 1000 mL

## 2015-11-02 MED ORDER — LIDOCAINE 2% (20 MG/ML) 5 ML SYRINGE
INTRAMUSCULAR | Status: AC
  Filled 2015-11-02: qty 5

## 2015-11-02 MED ORDER — CHLORHEXIDINE GLUCONATE CLOTH 2 % EX PADS: 6.0000 | MEDICATED_PAD | Freq: Once | CUTANEOUS | Status: DC

## 2015-11-02 MED ORDER — ACETAMINOPHEN 500 MG PO TABS
1000.0000 mg | ORAL_TABLET | ORAL | Status: AC
  Administered 2015-11-02: 1000 mg via ORAL
  Filled 2015-11-02: qty 2

## 2015-11-02 MED ORDER — PHENYLEPHRINE HCL 10 MG/ML IJ SOLN
INTRAVENOUS | Status: DC | PRN
  Administered 2015-11-02: 25 ug/min via INTRAVENOUS

## 2015-11-02 MED ORDER — PANTOPRAZOLE SODIUM 20 MG PO TBEC
20.0000 mg | DELAYED_RELEASE_TABLET | Freq: Every day | ORAL | Status: DC
  Administered 2015-11-02 – 2015-11-04 (×3): 20 mg via ORAL
  Filled 2015-11-02 (×3): qty 1

## 2015-11-02 MED ORDER — ACETAMINOPHEN 500 MG PO TABS
1000.0000 mg | ORAL_TABLET | Freq: Four times a day (QID) | ORAL | Status: DC
  Administered 2015-11-02 – 2015-11-04 (×6): 1000 mg via ORAL
  Filled 2015-11-02 (×7): qty 2

## 2015-11-02 MED ORDER — SIMETHICONE 80 MG PO CHEW: 40.0000 mg | CHEWABLE_TABLET | Freq: Four times a day (QID) | ORAL | Status: DC | PRN

## 2015-11-02 MED ORDER — INFLUENZA VAC SPLIT QUAD 0.5 ML IM SUSY
0.5000 mL | PREFILLED_SYRINGE | INTRAMUSCULAR | Status: AC
  Administered 2015-11-03: 0.5 mL via INTRAMUSCULAR
  Filled 2015-11-02: qty 0.5

## 2015-11-02 MED ORDER — FENTANYL CITRATE (PF) 250 MCG/5ML IJ SOLN
INTRAMUSCULAR | Status: DC | PRN
  Administered 2015-11-02: 100 ug via INTRAVENOUS
  Administered 2015-11-02 (×2): 50 ug via INTRAVENOUS

## 2015-11-02 MED ORDER — EPHEDRINE 5 MG/ML INJ
INTRAVENOUS | Status: AC
  Filled 2015-11-02: qty 10

## 2015-11-02 MED ORDER — KETOROLAC TROMETHAMINE 30 MG/ML IJ SOLN
30.0000 mg | Freq: Four times a day (QID) | INTRAMUSCULAR | Status: DC | PRN
  Filled 2015-11-02: qty 1

## 2015-11-02 MED ORDER — SUGAMMADEX SODIUM 200 MG/2ML IV SOLN
INTRAVENOUS | Status: AC
  Filled 2015-11-02: qty 2

## 2015-11-02 MED ORDER — ONDANSETRON HCL 4 MG/2ML IJ SOLN
INTRAMUSCULAR | Status: DC | PRN
  Administered 2015-11-02: 4 mg via INTRAVENOUS

## 2015-11-02 MED ORDER — CELECOXIB 200 MG PO CAPS
400.0000 mg | ORAL_CAPSULE | ORAL | Status: AC
  Administered 2015-11-02: 400 mg via ORAL
  Filled 2015-11-02: qty 2

## 2015-11-02 MED ORDER — HYDROMORPHONE HCL 1 MG/ML IJ SOLN: 1.0000 mg | INTRAMUSCULAR | Status: DC | PRN

## 2015-11-02 MED ORDER — MIDAZOLAM HCL 2 MG/2ML IJ SOLN
INTRAMUSCULAR | Status: AC
  Filled 2015-11-02: qty 2

## 2015-11-02 MED ORDER — KCL IN DEXTROSE-NACL 20-5-0.9 MEQ/L-%-% IV SOLN
INTRAVENOUS | Status: DC
  Administered 2015-11-02 – 2015-11-03 (×2): via INTRAVENOUS
  Filled 2015-11-02 (×3): qty 1000

## 2015-11-02 MED ORDER — ONDANSETRON HCL 4 MG/2ML IJ SOLN: 4.0000 mg | Freq: Four times a day (QID) | INTRAMUSCULAR | Status: DC | PRN

## 2015-11-02 MED ORDER — HYDRALAZINE HCL 20 MG/ML IJ SOLN
10.0000 mg | INTRAMUSCULAR | Status: DC | PRN
  Filled 2015-11-02: qty 0.5

## 2015-11-02 MED ORDER — LACTATED RINGERS IV SOLN
INTRAVENOUS | Status: DC | PRN
  Administered 2015-11-02 (×3): via INTRAVENOUS

## 2015-11-02 MED ORDER — KETOROLAC TROMETHAMINE 30 MG/ML IJ SOLN
30.0000 mg | Freq: Four times a day (QID) | INTRAMUSCULAR | Status: DC
  Administered 2015-11-02 – 2015-11-04 (×7): 30 mg via INTRAVENOUS
  Filled 2015-11-02 (×8): qty 1

## 2015-11-02 MED ORDER — CEFAZOLIN SODIUM-DEXTROSE 2-4 GM/100ML-% IV SOLN
2.0000 g | Freq: Three times a day (TID) | INTRAVENOUS | Status: AC
  Administered 2015-11-02: 2 g via INTRAVENOUS
  Filled 2015-11-02: qty 100

## 2015-11-02 MED ORDER — HYDROMORPHONE HCL 2 MG/ML IJ SOLN
1.0000 mg | INTRAMUSCULAR | Status: DC | PRN
  Administered 2015-11-02 – 2015-11-03 (×6): 1 mg via INTRAVENOUS
  Filled 2015-11-02 (×6): qty 1

## 2015-11-02 MED ORDER — SUCCINYLCHOLINE CHLORIDE 20 MG/ML IJ SOLN
INTRAMUSCULAR | Status: DC | PRN
  Administered 2015-11-02: 120 mg via INTRAVENOUS

## 2015-11-02 MED ORDER — MIDAZOLAM HCL 5 MG/5ML IJ SOLN
INTRAMUSCULAR | Status: DC | PRN
  Administered 2015-11-02: 2 mg via INTRAVENOUS

## 2015-11-02 MED ORDER — EPHEDRINE SULFATE-NACL 50-0.9 MG/10ML-% IV SOSY
PREFILLED_SYRINGE | INTRAVENOUS | Status: DC | PRN
  Administered 2015-11-02: 5 mg via INTRAVENOUS

## 2015-11-02 MED ORDER — PROPOFOL 10 MG/ML IV BOLUS
INTRAVENOUS | Status: DC | PRN
  Administered 2015-11-02: 200 mg via INTRAVENOUS
  Administered 2015-11-02: 40 mg via INTRAVENOUS

## 2015-11-02 MED ORDER — PROPOFOL 10 MG/ML IV BOLUS
INTRAVENOUS | Status: AC
  Filled 2015-11-02: qty 20

## 2015-11-02 MED ORDER — SUGAMMADEX SODIUM 200 MG/2ML IV SOLN
INTRAVENOUS | Status: DC | PRN
  Administered 2015-11-02: 200 mg via INTRAVENOUS

## 2015-11-02 MED ORDER — GABAPENTIN 300 MG PO CAPS
300.0000 mg | ORAL_CAPSULE | ORAL | Status: AC
  Administered 2015-11-02: 300 mg via ORAL
  Filled 2015-11-02: qty 1

## 2015-11-02 MED ORDER — PHENYLEPHRINE HCL 10 MG/ML IJ SOLN
INTRAMUSCULAR | Status: DC | PRN
  Administered 2015-11-02: 40 ug via INTRAVENOUS
  Administered 2015-11-02: 80 ug via INTRAVENOUS
  Administered 2015-11-02: 40 ug via INTRAVENOUS
  Administered 2015-11-02: 80 ug via INTRAVENOUS
  Administered 2015-11-02: 40 ug via INTRAVENOUS

## 2015-11-02 MED ORDER — HYDROMORPHONE HCL 1 MG/ML IJ SOLN
INTRAMUSCULAR | Status: AC
  Filled 2015-11-02: qty 1

## 2015-11-02 MED ORDER — LIDOCAINE HCL (CARDIAC) 20 MG/ML IV SOLN
INTRAVENOUS | Status: DC | PRN
  Administered 2015-11-02: 100 mg via INTRATRACHEAL

## 2015-11-02 MED ORDER — PROMETHAZINE HCL 25 MG/ML IJ SOLN
6.2500 mg | INTRAMUSCULAR | Status: DC | PRN
  Administered 2015-11-02: 12.5 mg via INTRAVENOUS

## 2015-11-02 MED ORDER — LACTATED RINGERS IV SOLN
INTRAVENOUS | Status: DC
  Administered 2015-11-02: 10:00:00 via INTRAVENOUS

## 2015-11-02 MED ORDER — PHENYLEPHRINE HCL 10 MG/ML IJ SOLN
INTRAMUSCULAR | Status: AC
  Filled 2015-11-02: qty 1

## 2015-11-02 MED ORDER — PHENYLEPHRINE 40 MCG/ML (10ML) SYRINGE FOR IV PUSH (FOR BLOOD PRESSURE SUPPORT)
PREFILLED_SYRINGE | INTRAVENOUS | Status: AC
  Filled 2015-11-02: qty 10

## 2015-11-02 MED ORDER — ROCURONIUM BROMIDE 100 MG/10ML IV SOLN
INTRAVENOUS | Status: DC | PRN
  Administered 2015-11-02: 40 mg via INTRAVENOUS
  Administered 2015-11-02: 20 mg via INTRAVENOUS
  Administered 2015-11-02 (×2): 10 mg via INTRAVENOUS

## 2015-11-02 MED ORDER — HYDROMORPHONE HCL 2 MG/ML IJ SOLN
INTRAMUSCULAR | Status: AC
  Filled 2015-11-02: qty 1

## 2015-11-02 MED ORDER — OXYCODONE HCL 5 MG PO TABS
5.0000 mg | ORAL_TABLET | ORAL | Status: DC | PRN
  Administered 2015-11-02 – 2015-11-04 (×7): 10 mg via ORAL
  Filled 2015-11-02 (×8): qty 2

## 2015-11-02 MED ORDER — HYDROMORPHONE HCL 2 MG/ML IJ SOLN
0.2500 mg | INTRAMUSCULAR | Status: DC | PRN
  Administered 2015-11-02 (×2): 0.5 mg via INTRAVENOUS
  Filled 2015-11-02 (×2): qty 0.25

## 2015-11-02 MED ORDER — ONDANSETRON 4 MG PO TBDP: 4.0000 mg | ORAL_TABLET | Freq: Four times a day (QID) | ORAL | Status: DC | PRN

## 2015-11-02 MED ORDER — HYDROMORPHONE HCL 1 MG/ML IJ SOLN
0.2500 mg | INTRAMUSCULAR | Status: DC | PRN
  Administered 2015-11-02: 1 mg via INTRAVENOUS

## 2015-11-02 MED ORDER — ENOXAPARIN SODIUM 40 MG/0.4ML ~~LOC~~ SOLN
40.0000 mg | SUBCUTANEOUS | Status: DC
  Administered 2015-11-03: 40 mg via SUBCUTANEOUS
  Filled 2015-11-02 (×2): qty 0.4

## 2015-11-02 MED ORDER — BUPIVACAINE-EPINEPHRINE (PF) 0.25% -1:200000 IJ SOLN
INTRAMUSCULAR | Status: AC
  Filled 2015-11-02: qty 30

## 2015-11-02 SURGICAL SUPPLY — 61 items
APPLIER CLIP ROT 10 11.4 M/L (STAPLE)
BINDER ABDOMINAL 12 ML 46-62 (SOFTGOODS) IMPLANT
BLADE SURG ROTATE 9660 (MISCELLANEOUS) IMPLANT
CANISTER SUCTION 2500CC (MISCELLANEOUS) ×3 IMPLANT
CHLORAPREP W/TINT 26ML (MISCELLANEOUS) ×3 IMPLANT
CLIP APPLIE ROT 10 11.4 M/L (STAPLE) IMPLANT
COVER SURGICAL LIGHT HANDLE (MISCELLANEOUS) ×3 IMPLANT
DERMABOND ADVANCED (GAUZE/BANDAGES/DRESSINGS) ×2
DERMABOND ADVANCED .7 DNX12 (GAUZE/BANDAGES/DRESSINGS) ×1 IMPLANT
DEVICE RELIATACK FIXATION (MISCELLANEOUS) ×3 IMPLANT
DEVICE SECURE STRAP 25 ABSORB (INSTRUMENTS) ×9 IMPLANT
DEVICE TROCAR PUNCTURE CLOSURE (ENDOMECHANICALS) ×3 IMPLANT
DRAPE WARM FLUID 44X44 (DRAPE) ×3 IMPLANT
ELECT CAUTERY BLADE 6.4 (BLADE) ×3 IMPLANT
ELECT REM PT RETURN 9FT ADLT (ELECTROSURGICAL) ×3
ELECTRODE REM PT RTRN 9FT ADLT (ELECTROSURGICAL) ×1 IMPLANT
GAUZE SPONGE 2X2 8PLY STRL LF (GAUZE/BANDAGES/DRESSINGS) ×2 IMPLANT
GLOVE BIO SURGEON STRL SZ7 (GLOVE) ×6 IMPLANT
GLOVE BIO SURGEON STRL SZ8 (GLOVE) ×3 IMPLANT
GLOVE BIOGEL PI IND STRL 6.5 (GLOVE) ×1 IMPLANT
GLOVE BIOGEL PI IND STRL 7.0 (GLOVE) ×2 IMPLANT
GLOVE BIOGEL PI IND STRL 8 (GLOVE) ×1 IMPLANT
GLOVE BIOGEL PI INDICATOR 6.5 (GLOVE) ×2
GLOVE BIOGEL PI INDICATOR 7.0 (GLOVE) ×4
GLOVE BIOGEL PI INDICATOR 8 (GLOVE) ×2
GOWN STRL REUS W/ TWL LRG LVL3 (GOWN DISPOSABLE) ×2 IMPLANT
GOWN STRL REUS W/ TWL XL LVL3 (GOWN DISPOSABLE) ×1 IMPLANT
GOWN STRL REUS W/TWL LRG LVL3 (GOWN DISPOSABLE) ×4
GOWN STRL REUS W/TWL XL LVL3 (GOWN DISPOSABLE) ×2
KIT BASIN OR (CUSTOM PROCEDURE TRAY) ×3 IMPLANT
KIT ROOM TURNOVER OR (KITS) ×3 IMPLANT
MARKER SKIN DUAL TIP RULER LAB (MISCELLANEOUS) ×3 IMPLANT
MESH VENTRALIGHT ST 7X9N (Mesh General) ×3 IMPLANT
NEEDLE SPNL 18GX3.5 QUINCKE PK (NEEDLE) ×3 IMPLANT
NS IRRIG 1000ML POUR BTL (IV SOLUTION) ×3 IMPLANT
PAD ARMBOARD 7.5X6 YLW CONV (MISCELLANEOUS) ×6 IMPLANT
PENCIL BUTTON HOLSTER BLD 10FT (ELECTRODE) ×3 IMPLANT
RELOAD RELIATACK 10 (MISCELLANEOUS) IMPLANT
RELOAD RELIATACK 5 (MISCELLANEOUS) IMPLANT
SCALPEL HARMONIC ACE (MISCELLANEOUS) IMPLANT
SCISSORS LAP 5X35 DISP (ENDOMECHANICALS) ×3 IMPLANT
SET IRRIG TUBING LAPAROSCOPIC (IRRIGATION / IRRIGATOR) IMPLANT
SHEARS HARMONIC ACE PLUS 36CM (ENDOMECHANICALS) ×3 IMPLANT
SLEEVE ENDOPATH XCEL 5M (ENDOMECHANICALS) ×9 IMPLANT
SPONGE GAUZE 2X2 STER 10/PKG (GAUZE/BANDAGES/DRESSINGS) ×4
STAPLER VISISTAT 35W (STAPLE) ×3 IMPLANT
SUT MON AB 4-0 PC3 18 (SUTURE) ×3 IMPLANT
SUT NOVA 1 T20/GS 25DT (SUTURE) ×3 IMPLANT
SUT NOVA NAB DX-16 0-1 5-0 T12 (SUTURE) ×9 IMPLANT
SUT VIC AB 0 CT1 27 (SUTURE)
SUT VIC AB 0 CT1 27XBRD ANBCTR (SUTURE) IMPLANT
TOWEL OR 17X24 6PK STRL BLUE (TOWEL DISPOSABLE) ×3 IMPLANT
TOWEL OR 17X26 10 PK STRL BLUE (TOWEL DISPOSABLE) IMPLANT
TRAY FOLEY CATH 16FR SILVER (SET/KITS/TRAYS/PACK) IMPLANT
TRAY LAPAROSCOPIC MC (CUSTOM PROCEDURE TRAY) ×3 IMPLANT
TROCAR XCEL NON-BLD 11X100MML (ENDOMECHANICALS) ×3 IMPLANT
TROCAR XCEL NON-BLD 5MMX100MML (ENDOMECHANICALS) ×3 IMPLANT
TUBE CONNECTING 12'X1/4 (SUCTIONS) ×1
TUBE CONNECTING 12X1/4 (SUCTIONS) ×2 IMPLANT
TUBING INSUFFLATION (TUBING) ×3 IMPLANT
YANKAUER SUCT BULB TIP NO VENT (SUCTIONS) ×3 IMPLANT

## 2015-11-02 NOTE — Op Note (Signed)
Preoperative diagnosis: Incisional hernia not incarcerated without gangrene  Postoperative diagnosis: Same  Procedure: Laparoscopic repair of incisional hernia with mesh  Surgeon: Erroll Luna M.D.  Anesthesia: Gen. with 0.25% Sensorcaine local with epinephrine  EBL: Minimal  Specimens: None  Drains: None  Indications for procedure: The patient's a 45 year old male who to previous laparotomy for perforated diverticulitis. He also had a loop ileostomy for diversion to allow healing of his anastomosis. He recovered from that but developed a bulge at his ileostomy site and upper midline incision. CT scan revealed a complex incisional hernia. I discussed options of repair with mesh including laparoscopic and open approaches. Pros and cons of each approach was a was discussed. Complications of each a long-term recurrence rates of each were discussed at great length. Infection rates are discussed with each. Risk of bleeding, infection, bowel injury, sepsis, death, organ injury, hernia recurrence, bowel obstruction, the need for further surgery, wound infection, death, 2, cardiovascular events, pulmonary events, and the need further treatments and/or procedures. He agreed to proceed.  Description of procedure: The patient was met in the holding area and questions are answered. He is taken back to the operating room placed upon the OR table. After induction of general anesthesia both arms were tucked. The abdomen was prepped and draped in sterile fashion Foley catheter was placed. Timeout was done. He received antibiotics. A 5 mm Optiview was used after sterile prep and drape the abdomen. This is placed in the department without difficulty. All layers of abdominal wall were visualized and after insertion CO2 insufflation to 15 mmHg of CO2 was done. There is no evidence of injury with placement this trocar. I placed 2 other 5 mm trochars one in the left lower quadrant and the other in the upper midline.  There were 2 separate hernias is umbilicus the second at his previous ileostomy site. There is only adhesions of omentum which are taken out hernia sac without difficulty. There was some small bowel that was tethered to the anterior abdominal wall below this this was carefully dissected down using sharp dissection without cautery. Once all the anterior abdominal wall adhesions were taken down, small bowel and colon were inspected there is no evidence of injury. A spinal needle was used and the extent of the hernias was marked out on the anterior abdominal wall. A 17 x 22 cm circular ventral light coated mesh was used. 4 tacking sutures of #1 Novafil were placed in all 4 quadrants. The mesh was then moistened. Through the old ileostomy site scar, an incision was made and dissection was carried and hernia sac. This interview don cavity and the mesh was placed to this. I then closed the fascial defect with interrupted #1 Novafil sutures. I reapproximated the subject is fat and close the skin with staples. I then replaced the laparoscope in the point the mesh with the rough side toward the abdominal wall. A suture passer was used in all 4 tacking sutures were brought out to the 4 separate quadrants. Only below the pubic symphysis, one was lateral to the right upper quadrant hernia site, one was to the left midline and one was directly superior. These were pulled up and covered the area which included the incision is well.the pubis quite nicely. All 4 sutures were tied down. Absorbable tacker tackers were used to tack the mesh circumferentially in 2 layers. This closed the defect nicely. There are some small adhesions that were taken down between the omentum in the pelvis to free up  the omentum and later this on top of about nicely. Reinspection of the internal viscera revealed no evidence of injury. Hemostasis was achieved. The ports are removed to allow CO2 to escape.  Skin staples used to close incisions. Dry  dressings applied. Abdominal binder placed. Foley catheter was removed. All final counts were correct. The patient was awoke extubated taken to recovery in satisfactory condition.                

## 2015-11-02 NOTE — Anesthesia Procedure Notes (Signed)
Procedure Name: Intubation Date/Time: 11/02/2015 11:32 AM Performed by: Brien MatesMAHONY, Claude Waldman D Pre-anesthesia Checklist: Patient identified, Emergency Drugs available, Suction available, Patient being monitored and Timeout performed Patient Re-evaluated:Patient Re-evaluated prior to inductionOxygen Delivery Method: Circle system utilized Preoxygenation: Pre-oxygenation with 100% oxygen Intubation Type: IV induction Ventilation: Mask ventilation without difficulty and Oral airway inserted - appropriate to patient size Laryngoscope Size: Glidescope (Elective) Grade View: Grade I Tube type: Oral Tube size: 7.5 mm Number of attempts: 1 Airway Equipment and Method: Stylet and Video-laryngoscopy Placement Confirmation: positive ETCO2,  ETT inserted through vocal cords under direct vision and breath sounds checked- equal and bilateral Secured at: 23 cm Tube secured with: Tape Dental Injury: Teeth and Oropharynx as per pre-operative assessment

## 2015-11-02 NOTE — Interval H&P Note (Signed)
History and Physical Interval Note:  11/02/2015 10:17 AM  Bryan [name]  has presented today for surgery, with the diagnosis of incisional hernia  The various methods of treatment have been discussed with the patient and family. After consideration of risks, benefits and other options for treatment, the patient has consented to  Procedure(s): LAPAROSCOPIC INCISIONAL HERNIA (N/A) INSERTION OF MESH (N/A) as a surgical intervention .  The patient's history has been reviewed, patient examined, no change in status, stable for surgery.  I have reviewed the patient's chart and labs.  Questions were answered to the patient's satisfaction.     Lorissa Kishbaugh A.

## 2015-11-02 NOTE — H&P (View-Only) (Signed)
Delice LeschChristopher J. Proffit 10/06/2015 2:30 PM Location: [facility] Patient #: [id number] DOB: 02/09/1970 Single / Language: [place] / Race: White Male  History of Present Illness Maisie Fus(Colleena Kurtenbach A. Ema Hebner MD; 10/06/2015 3:25 PM) Patient words: Patient returns for follow-up of his incisional hernia. He had developed an incisional hernia at his loop ileostomy site. He returns today to set up surgery for next month. He has had no change in his symptoms. He denies any significant abdominal pain but does notice the bulge in his right lower quadrant. Denies nausea or vomiting. Bowel function is normal.  The patient is a 45 year old male.   Allergies Fay Records(Ashley Beck, [place]; 10/06/2015 2:31 PM) No Known Drug Allergies 09/14/2014  Medication History Fay Records(Ashley Beck, [place]; 10/06/2015 2:31 PM) Minocycline HCl (50MG  Capsule, Oral) Active. Medications Reconciled    Vitals Fay Records(Ashley Beck CMA; 10/06/2015 2:31 PM) 10/06/2015 2:31 PM Weight: 277 lb Height: 70in Body Surface Area: 2.4 m Body Mass Index: 39.74 kg/m  Temp.: 97.55F(Temporal)  Pulse: 90 (Regular)  BP: 130/80 (Sitting, Left Arm, Standard)      Physical Exam (Davy Westmoreland A. Eliana Lueth MD; 10/06/2015 3:26 PM)  General Mental Status-Alert. General Appearance-Consistent with stated age. Hydration-Well hydrated. Voice-Normal.  Head and Neck Head-normocephalic, atraumatic with no lesions or palpable masses. Trachea-midline. Thyroid Gland Characteristics - normal size and consistency.  Chest and Lung Exam Chest and lung exam reveals -quiet, even and easy respiratory effort with no use of accessory muscles and on auscultation, normal breath sounds, no adventitious sounds and normal vocal resonance. Inspection Chest Wall - Normal. Back - normal.  Cardiovascular Cardiovascular examination reveals -normal heart sounds, regular rate and rhythm with no murmurs and normal pedal pulses bilaterally.  Abdomen Note:  Reducible right lower quadrant incisional hernia at previous loop ileostomy site. Umbilical hernia noted as well just medial to that.  Neurologic Neurologic evaluation reveals -alert and oriented x 3 with no impairment of recent or remote memory. Mental Status-Normal.    Assessment & Plan (Kalik Hoare A. Artasia Thang MD; 10/06/2015 2:59 PM)  INCISIONAL HERNIA OF ANTERIOR ABDOMINAL WALL WITHOUT OBSTRUCTION OR GANGRENE (K43.2) Impression: Small incisional hernia had previous ileostomy site extending to the umbilicus. Discussed laparoscopic and open repairs with mesh. Discussed the pros and cons of each and he has chosen laparoscopic repair after discussion. The risk of hernia repair include bleeding, infection, organ injury, bowel injury, bladder injury, nerve injury recurrent hernia, blood clots, worsening of underlying condition, chronic pain, mesh use, open surgery, death, and the need for other operattions. Pt agrees to proceed  Current Plans Pt Education - Pamphlet Given - Hernia Surgery: discussed with patient and provided information. You are being scheduled for surgery - Our schedulers will call you.  You should hear from our office's scheduling department within 5 working days about the location, date, and time of surgery. We try to make accommodations for patient's preferences in scheduling surgery, but sometimes the OR schedule or the surgeon's schedule prevents us from making those accommodations.  If you have not heard from our office [phone]) in 5 working days, call the office and ask for your surgeon's nurse.  If you have other questions about your diagnosis, plan, or surgery, call the office and ask for your surgeon's nurse.  The anatomy & physiology of the abdominal wall was discussed. The pathophysiology of hernias was discussed. Natural history risks without surgery including progeressive enlargement, pain, incarceration, & strangulation was discussed. Contributors to  complications such as smoking, obesity, diabetes, prior surgery, etc were discussed.  I feel the risks of no intervention will lead to serious problems that outweigh the operative risks; therefore, I recommended surgery to reduce and repair the hernia. I explained laparoscopic techniques with possible need for an open approach. I noted the probable use of mesh to patch and/or buttress the hernia repair  Risks such as bleeding, infection, abscess, need for further treatment, heart attack, death, and other risks were discussed. I noted a good likelihood this will help address the problem. Goals of post-operative recovery were discussed as well. Possibility that this will not correct all symptoms was explained. I stressed the importance of low-impact activity, aggressive pain control, avoiding constipation, & not pushing through pain to minimize risk of post-operative chronic pain or injury. Possibility of reherniation especially with smoking, obesity, diabetes, immunosuppression, and other health conditions was discussed. We will work to minimize complications.  An educational handout further explaining the pathology & treatment options was given as well. Questions were answered. The patient expresses understanding & wishes to proceed with surgery.  The anatomy & physiology of the abdominal wall was discussed. The pathophysiology of hernias was discussed. Natural history risks without surgery including progeressive enlargement, pain, incarceration, & strangulation was discussed. Contributors to complications such as smoking, obesity, diabetes, prior surgery, etc were discussed.  I feel the risks of no intervention will lead to serious problems that outweigh the operative risks; therefore, I recommended surgery to reduce and repair the hernia. I explained laparoscopic techniques with possible need for an open approach. I noted the probable use of mesh to patch and/or buttress the hernia repair  Risks such  as bleeding, infection, abscess, need for further treatment, heart attack, death, and other risks were discussed. I noted a good likelihood this will help address the problem. Goals of post-operative recovery were discussed as well. Possibility that this will not correct all symptoms was explained. I stressed the importance of low-impact activity, aggressive pain control, avoiding constipation, & not pushing through pain to minimize risk of post-operative chronic pain or injury. Possibility of reherniation especially with smoking, obesity, diabetes, immunosuppression, and other health conditions was discussed. We will work to minimize complications.  An educational handout further explaining the pathology & treatment options was given as well. Questions were answered. The patient expresses understanding & wishes to proceed with surgery.

## 2015-11-02 NOTE — Anesthesia Postprocedure Evaluation (Signed)
Anesthesia Post Note  Patient: Bryan [name]  Procedure(s) Performed: Procedure(s) (LRB): LAPAROSCOPIC INCISIONAL HERNIA (N/A) INSERTION OF MESH (N/A)  Patient location during evaluation: PACU Anesthesia Type: General Level of consciousness: awake and alert Pain management: pain level controlled Vital Signs Assessment: post-procedure vital signs reviewed and stable Respiratory status: spontaneous breathing, nonlabored ventilation, respiratory function stable and patient connected to nasal cannula oxygen Cardiovascular status: blood pressure returned to baseline and stable Postop Assessment: no signs of nausea or vomiting Anesthetic complications: no    Last Vitals:  Vitals:   11/02/15 1500 11/02/15 1507  BP:  138/87  Pulse: 84 78  Resp: 18 11  Temp:      Last Pain:  Vitals:   11/02/15 1454  TempSrc:   PainSc: 0-No pain                 Bryan [name], Bryan [name]

## 2015-11-02 NOTE — Transfer of Care (Signed)
Immediate Anesthesia Transfer of Care Note  Patient: Bryan LeschChristopher J Adorno  Procedure(s) Performed: Procedure(s): LAPAROSCOPIC INCISIONAL HERNIA (N/A) INSERTION OF MESH (N/A)  Patient Location: PACU  Anesthesia Type:General  Level of Consciousness: awake  Airway & Oxygen Therapy: Patient Spontanous Breathing and Patient connected to face mask oxygen  Post-op Assessment: Report given to RN and Post -op Vital signs reviewed and stable  Post vital signs: Reviewed and stable  Last Vitals:  Vitals:   11/02/15 0857  BP: (!) 157/97  Pulse: (!) 5  Resp: 18  Temp: 37.1 C    Last Pain:  Vitals:   11/02/15 0857  TempSrc: Oral         Complications: No apparent anesthesia complications

## 2015-11-02 NOTE — Progress Notes (Signed)
Pt admitted to 6N05 via bed from PACU.  Pt AAO X4.  Pt on RA.  Pt has several abd lap sites with gauze and transparent dressing with abd binder.  Pt has 20G to lt hand with fluids infusing.  SCDs in place.  Pt due to void.  Report rcvd from Ly, RN.  Pt has no complaints at the moment.  Will continue to monitor.

## 2015-11-02 NOTE — Anesthesia Preprocedure Evaluation (Signed)
Anesthesia Evaluation  Patient identified by MRN, date of birth, ID band Patient awake    Reviewed: Allergy & Precautions, NPO status , Patient's Chart, lab work & pertinent test results  Airway Mallampati: II  TM Distance: <3 FB Neck ROM: full  Mouth opening: Limited Mouth Opening  Dental  (+) Teeth Intact, Dental Advidsory Given   Pulmonary shortness of breath,    breath sounds clear to auscultation       Cardiovascular negative cardio ROS   Rhythm:regular Rate:Normal     Neuro/Psych    GI/Hepatic S/p colectomy.   Endo/Other  Morbid obesityobese  Renal/GU      Musculoskeletal   Abdominal   Peds  Hematology   Anesthesia Other Findings   Reproductive/Obstetrics                             Anesthesia Physical  Anesthesia Plan  ASA: III  Anesthesia Plan: General   Post-op Pain Management:    Induction: Intravenous  Airway Management Planned: Oral ETT  Additional Equipment:   Intra-op Plan:   Post-operative Plan: Extubation in OR  Informed Consent: I have reviewed the patients History and Physical, chart, labs and discussed the procedure including the risks, benefits and alternatives for the proposed anesthesia with the patient or authorized representative who has indicated his/her understanding and acceptance.   Dental Advisory Given  Plan Discussed with: CRNA, Anesthesiologist and Surgeon  Anesthesia Plan Comments:         Anesthesia Quick Evaluation

## 2015-11-03 ENCOUNTER — Encounter (HOSPITAL_COMMUNITY): Payer: Self-pay | Admitting: Surgery

## 2015-11-03 DIAGNOSIS — K432 Incisional hernia without obstruction or gangrene: Secondary | ICD-10-CM | POA: Diagnosis not present

## 2015-11-03 NOTE — Progress Notes (Signed)
1 Day Post-Op  Subjective: Doing well no flatus yet  Tolerating diet pain is a  4  Objective: Vital signs in last 24 hours: Temp:  [97.6 F (36.4 C)-98.7 F (37.1 C)] 98.1 F (36.7 C) (10/18 0500) Pulse Rate:  [5-89] 73 (10/18 0500) Resp:  [11-18] 17 (10/18 0500) BP: (123-157)/(72-97) 123/80 (10/18 0500) SpO2:  [95 %-100 %] 100 % (10/18 0500) Weight:  [122.9 kg (270 lb 15.1 oz)-122.9 kg (271 lb)] 122.9 kg (270 lb 15.1 oz) (10/17 1636) Last BM Date: 11/02/15  Intake/Output from previous day: 10/17 0701 - 10/18 0700 In: 3996.7 [P.O.:720; I.V.:3276.7] Out: 870 [Urine:850; Blood:20] Intake/Output this shift: No intake/output data recorded.  Incision/Wound:dressings dry  Soft BS present   Sore   Lab Results:   Recent Labs  11/02/15 1922  WBC 14.4*  HGB 13.2  HCT 39.9  PLT 244   BMET  Recent Labs  11/02/15 1922  CREATININE 1.66*   PT/INR No results for input(s): LABPROT, INR in the last 72 hours. ABG No results for input(s): PHART, HCO3 in the last 72 hours.  Invalid input(s): PCO2, PO2  Studies/Results: No results found.  Anti-infectives: Anti-infectives    Start     Dose/Rate Route Frequency Ordered Stop   11/02/15 1930  ceFAZolin (ANCEF) IVPB 2g/100 mL premix     2 g 200 mL/hr over 30 Minutes Intravenous Every 8 hours 11/02/15 1653 11/02/15 2126   11/02/15 1000  ceFAZolin (ANCEF) 3 g in dextrose 5 % 50 mL IVPB     3 g 130 mL/hr over 30 Minutes Intravenous To ShortStay Surgical 11/01/15 1023 11/02/15 1133      Assessment/Plan: s/p Procedure(s): LAPAROSCOPIC INCISIONAL HERNIA (N/A) INSERTION OF MESH (N/A) Plan for discharge tomorrow  OOB    SL IV   Ambulate   LOS: 1 day    Elchanan Bob A. 11/03/2015

## 2015-11-04 DIAGNOSIS — K432 Incisional hernia without obstruction or gangrene: Secondary | ICD-10-CM | POA: Diagnosis not present

## 2015-11-04 MED ORDER — ONDANSETRON 4 MG PO TBDP: 4.0000 mg | ORAL_TABLET | Freq: Four times a day (QID) | ORAL | 0 refills | Status: DC | PRN

## 2015-11-04 MED ORDER — OXYCODONE HCL 5 MG PO TABS: 5.0000 mg | ORAL_TABLET | ORAL | 0 refills | Status: DC | PRN

## 2015-11-04 MED ORDER — SIMETHICONE 80 MG PO CHEW: 40.0000 mg | CHEWABLE_TABLET | Freq: Four times a day (QID) | ORAL | 0 refills | Status: DC | PRN

## 2015-11-04 MED ORDER — POLYETHYLENE GLYCOL 3350 17 G PO PACK: 17.0000 g | PACK | Freq: Every day | ORAL | 0 refills | Status: DC

## 2015-11-04 NOTE — Progress Notes (Signed)
D/c instructions reviewed with pt, copy of instructions and scripts given to pt. Pt d/c'd via wheelchair with belonging with family, escorted by hospital volunteer.

## 2015-11-04 NOTE — Progress Notes (Signed)
2 Days Post-Op  Subjective: Doing well   Objective: Vital signs in last 24 hours: Temp:  [98 F (36.7 C)-100.1 F (37.8 C)] 98 F (36.7 C) (10/19 0625) Pulse Rate:  [85-102] 85 (10/19 0625) Resp:  [17-19] 19 (10/19 0625) BP: (111-159)/(69-89) 111/69 (10/19 0625) SpO2:  [96 %-98 %] 97 % (10/19 0625) Last BM Date: 11/02/15  Intake/Output from previous day: 10/18 0701 - 10/19 0700 In: 1680 [P.O.:1680] Out: 1100 [Urine:1100] Intake/Output this shift: No intake/output data recorded.  Incision/Wound:dssg dry  Soft  BS present  Min soreness   Lab Results:   Recent Labs  11/02/15 1922  WBC 14.4*  HGB 13.2  HCT 39.9  PLT 244   BMET  Recent Labs  11/02/15 1922  CREATININE 1.66*   PT/INR No results for input(s): LABPROT, INR in the last 72 hours. ABG No results for input(s): PHART, HCO3 in the last 72 hours.  Invalid input(s): PCO2, PO2  Studies/Results: No results found.  Anti-infectives: Anti-infectives    Start     Dose/Rate Route Frequency Ordered Stop   11/02/15 1930  ceFAZolin (ANCEF) IVPB 2g/100 mL premix     2 g 200 mL/hr over 30 Minutes Intravenous Every 8 hours 11/02/15 1653 11/02/15 2126   11/02/15 1000  ceFAZolin (ANCEF) 3 g in dextrose 5 % 50 mL IVPB     3 g 130 mL/hr over 30 Minutes Intravenous To ShortStay Surgical 11/01/15 1023 11/02/15 1133      Assessment/Plan: s/p Procedure(s): LAPAROSCOPIC INCISIONAL HERNIA (N/A) INSERTION OF MESH (N/A) Discharge  LOS: 2 days    Genoa Freyre A. 11/04/2015

## 2015-11-04 NOTE — Discharge Summary (Signed)
Physician Discharge Summary  Patient ID: Bryan LeschChristopher J [name] MRN: [id number] DOB/AGE: 47/09/1970 47 y.o.  Admit date: 11/02/2015 Discharge date: 11/04/2015  Admission Diagnoses:see below   Discharge Diagnoses:  Active Problems:   Incisional hernia without obstruction or gangrene   Incisional hernia   Discharged Condition: good  Hospital Course: pt did well.  He had good pain control and tolerating diet.  Wounds clean and vitals stable.  Bowel function resumed.    Consults: None  Significant Diagnostic Studies: labs:  CBC    Component Value Date/Time   WBC 14.4 (H) 11/02/2015 1922   RBC 4.72 11/02/2015 1922   HGB 13.2 11/02/2015 1922   HCT 39.9 11/02/2015 1922   PLT 244 11/02/2015 1922   MCV 84.5 11/02/2015 1922   MCH 28.0 11/02/2015 1922   MCHC 33.1 11/02/2015 1922   RDW 14.2 11/02/2015 1922   LYMPHSABS 1.9 10/26/2015 1530   MONOABS 0.6 10/26/2015 1530   EOSABS 0.3 10/26/2015 1530   BASOSABS 0.1 10/26/2015 1530    Treatments: surgery: laparoscopic  Hernia repair with mesh   Discharge Exam: Blood pressure 111/69, pulse 85, temperature 98 F (36.7 C), temperature source Oral, resp. rate 19, height 5\' 10"  (1.778 m), weight 122.9 kg (270 lb 15.1 oz), SpO2 97 %. Incision/Wound:CDI soft nt abdomen   Disposition: 01-Home or Self Care      Signed: Jamaury Gumz A. 11/04/2015, 7:19 AM

## 2015-11-04 NOTE — Discharge Instructions (Signed)
[facility] _______[facility] ° °UMBILICAL OR INGUINAL HERNIA REPAIR: POST OP INSTRUCTIONS ° °Always review your discharge instruction sheet given to you by the facility where your surgery was performed. °IF YOU HAVE DISABILITY OR FAMILY LEAVE FORMS, YOU MUST BRING THEM TO THE OFFICE FOR PROCESSING.   °DO NOT GIVE THEM TO YOUR DOCTOR. ° °1. A  prescription for pain medication may be given to you upon discharge.  Take your pain medication as prescribed, if needed.  If narcotic pain medicine is not needed, then you may take acetaminophen (Tylenol) or ibuprofen (Advil) as needed. °2. Take your usually prescribed medications unless otherwise directed. °If you need a refill on your pain medication, please contact your pharmacy.  They will contact our office to request authorization. Prescriptions will not be filled after 5 pm or on week-ends. °3. You should follow a light diet the first 24 hours after arrival home, such as soup and crackers, etc.  Be sure to include lots of fluids daily.  Resume your normal diet the day after surgery. °4.Most patients will experience some swelling and bruising around the umbilicus or in the groin and scrotum.  Ice packs and reclining will help.  Swelling and bruising can take several days to resolve.  °6. It is common to experience some constipation if taking pain medication after surgery.  Increasing fluid intake and taking a stool softener (such as Colace) will usually help or prevent this problem from occurring.  A mild laxative (Milk of Magnesia or Miralax) should be taken according to package directions if there are no bowel movements after 48 hours. °7. Unless discharge instructions indicate otherwise, you may remove your bandages 24-48 hours after surgery, and you may shower at that time.  You may have steri-strips (small skin tapes) in place directly over the incision.  These strips should be left on the skin for 7-10 days.  If your surgeon used skin glue on the  incision, you may shower in 24 hours.  The glue will flake off over the next 2-3 weeks.  Any sutures or staples will be removed at the office during your follow-up visit. °8. ACTIVITIES:  You may resume regular (light) daily activities beginning the next day--such as daily self-care, walking, climbing stairs--gradually increasing activities as tolerated.  You may have sexual intercourse when it is comfortable.  Refrain from any heavy lifting or straining until approved by your doctor. ° °a.You may drive when you are no longer taking prescription pain medication, you can comfortably wear a seatbelt, and you can safely maneuver your car and apply brakes. °b.RETURN TO WORK:   °_____________________________________________ ° °9.You should see your doctor in the office for a follow-up appointment approximately 2-3 weeks after your surgery.  Make sure that you call for this appointment within a day or two after you arrive home to insure a convenient appointment time. °10.OTHER INSTRUCTIONS: _________________________ °   _____________________________________ ° °WHEN TO CALL YOUR DOCTOR: °1. Fever over 101.0 °2. Inability to urinate °3. Nausea and/or vomiting °4. Extreme swelling or bruising °5. Continued bleeding from incision. °6. Increased pain, redness, or drainage from the incision ° °The clinic staff is available to answer your questions during regular business hours.  Please don’t hesitate to call and ask to speak to one of the nurses for clinical concerns.  If you have a medical emergency, go to the nearest emergency room or call 911.  A surgeon from [facility] is always on call at the hospital ° ° °  [address], [address], [place], [place]  [address] ? ° [address] [address], [place], [place]   [address] °[phone] ? [phone] ? FAX [phone] °Web site: [email or web address] °

## 2015-11-10 ENCOUNTER — Encounter (HOSPITAL_COMMUNITY): Payer: Self-pay | Admitting: Surgery

## 2017-05-25 ENCOUNTER — Emergency Department (HOSPITAL_COMMUNITY)
Admission: EM | Admit: 2017-05-25 | Discharge: 2017-05-25 | Disposition: A | Discharge status: Home / Self Care | Payer: BLUE CROSS/BLUE SHIELD | Attending: Emergency Medicine | Admitting: Emergency Medicine

## 2017-05-25 ENCOUNTER — Encounter (HOSPITAL_COMMUNITY): Payer: Self-pay | Admitting: Emergency Medicine

## 2017-05-25 ENCOUNTER — Other Ambulatory Visit: Payer: Self-pay

## 2017-05-25 ENCOUNTER — Emergency Department (HOSPITAL_COMMUNITY): Payer: BLUE CROSS/BLUE SHIELD

## 2017-05-25 DIAGNOSIS — N201 Calculus of ureter: Secondary | ICD-10-CM | POA: Insufficient documentation

## 2017-05-25 DIAGNOSIS — Z79899 Other long term (current) drug therapy: Secondary | ICD-10-CM | POA: Diagnosis not present

## 2017-05-25 DIAGNOSIS — R1031 Right lower quadrant pain: Secondary | ICD-10-CM | POA: Diagnosis present

## 2017-05-25 LAB — CBC WITH DIFFERENTIAL/PLATELET
BASOS PCT: 0 %
Basophils Absolute: 0 10*3/uL (ref 0.0–0.1)
EOS ABS: 0.3 10*3/uL (ref 0.0–0.7)
Eosinophils Relative: 3 %
HCT: 37.8 % — ABNORMAL LOW (ref 39.0–52.0)
HEMOGLOBIN: 12.5 g/dL — AB (ref 13.0–17.0)
LYMPHS ABS: 1.8 10*3/uL (ref 0.7–4.0)
Lymphocytes Relative: 20 %
MCH: 28.3 pg (ref 26.0–34.0)
MCHC: 33.1 g/dL (ref 30.0–36.0)
MCV: 85.5 fL (ref 78.0–100.0)
Monocytes Absolute: 0.4 10*3/uL (ref 0.1–1.0)
Monocytes Relative: 5 %
NEUTROS PCT: 72 %
Neutro Abs: 6.4 10*3/uL (ref 1.7–7.7)
Platelets: 243 10*3/uL (ref 150–400)
RBC: 4.42 MIL/uL (ref 4.22–5.81)
RDW: 13.4 % (ref 11.5–15.5)
WBC: 9 10*3/uL (ref 4.0–10.5)

## 2017-05-25 LAB — COMPREHENSIVE METABOLIC PANEL
ALBUMIN: 3.7 g/dL (ref 3.5–5.0)
ALT: 19 U/L (ref 17–63)
AST: 19 U/L (ref 15–41)
Alkaline Phosphatase: 55 U/L (ref 38–126)
Anion gap: 7 (ref 5–15)
BUN: 19 mg/dL (ref 6–20)
CALCIUM: 8.4 mg/dL — AB (ref 8.9–10.3)
CHLORIDE: 108 mmol/L (ref 101–111)
CO2: 25 mmol/L (ref 22–32)
CREATININE: 1.94 mg/dL — AB (ref 0.61–1.24)
GFR calc non Af Amer: 39 mL/min — ABNORMAL LOW (ref >60)
GFR, EST AFRICAN AMERICAN: 45 mL/min — AB (ref >60)
GLUCOSE: 98 mg/dL (ref 65–99)
Potassium: 4.2 mmol/L (ref 3.5–5.1)
SODIUM: 140 mmol/L (ref 135–145)
Total Bilirubin: 0.7 mg/dL (ref 0.3–1.2)
Total Protein: 7.1 g/dL (ref 6.5–8.1)

## 2017-05-25 LAB — URINALYSIS, ROUTINE W REFLEX MICROSCOPIC
Bacteria, UA: NONE SEEN
Bilirubin Urine: NEGATIVE
GLUCOSE, UA: NEGATIVE mg/dL
KETONES UR: NEGATIVE mg/dL
Leukocytes, UA: NEGATIVE
Nitrite: NEGATIVE
PH: 5 (ref 5.0–8.0)
Protein, ur: 30 mg/dL — AB
SPECIFIC GRAVITY, URINE: 1.023 (ref 1.005–1.030)

## 2017-05-25 LAB — LIPASE, BLOOD: Lipase: 52 U/L — ABNORMAL HIGH (ref 11–51)

## 2017-05-25 MED ORDER — SODIUM CHLORIDE 0.9 % IV BOLUS
500.0000 mL | Freq: Once | INTRAVENOUS | Status: AC
  Administered 2017-05-25: 500 mL via INTRAVENOUS

## 2017-05-25 MED ORDER — OXYCODONE-ACETAMINOPHEN 5-325 MG PO TABS
1.0000 | ORAL_TABLET | Freq: Once | ORAL | Status: AC
  Administered 2017-05-25: 1 via ORAL
  Filled 2017-05-25: qty 1

## 2017-05-25 MED ORDER — HYDROMORPHONE HCL 2 MG/ML IJ SOLN
0.5000 mg | Freq: Once | INTRAMUSCULAR | Status: AC
  Administered 2017-05-25: 0.5 mg via INTRAVENOUS
  Filled 2017-05-25: qty 1

## 2017-05-25 MED ORDER — OXYCODONE-ACETAMINOPHEN 5-325 MG PO TABS: 1.0000 | ORAL_TABLET | ORAL | 0 refills | Status: DC | PRN

## 2017-05-25 MED ORDER — ONDANSETRON 4 MG PO TBDP: 4.0000 mg | ORAL_TABLET | Freq: Three times a day (TID) | ORAL | 0 refills | Status: DC | PRN

## 2017-05-25 MED ORDER — TAMSULOSIN HCL 0.4 MG PO CAPS: 0.4000 mg | ORAL_CAPSULE | Freq: Every day | ORAL | 0 refills | Status: DC

## 2017-05-25 MED ORDER — ONDANSETRON HCL 4 MG/2ML IJ SOLN
4.0000 mg | Freq: Once | INTRAMUSCULAR | Status: AC
  Administered 2017-05-25: 4 mg via INTRAVENOUS
  Filled 2017-05-25: qty 2

## 2017-05-25 MED ORDER — OXYCODONE-ACETAMINOPHEN 5-325 MG PO TABS
1.0000 | ORAL_TABLET | ORAL | Status: DC | PRN
  Administered 2017-05-25: 1 via ORAL
  Filled 2017-05-25: qty 1

## 2017-05-25 NOTE — ED Provider Notes (Signed)
[facility] EMERGENCY DEPARTMENT Provider Note   CSN: [id number] Arrival date & time: 05/25/17  4098     History   Chief Complaint Chief Complaint  Patient presents with  . Abdominal Pain    HPI Bryan [name] is a 47 y.o. male.  HPI 47 year old Caucasian male past medical history significant for nephrolithiasis, diverticulitis and prior appendectomy presents to the emergency department today for evaluation of ongoing right flank pain, right lower quadrant abdominal pain and hematuria.  Patient states that his symptoms started approximately 1 week ago.  Was seen by an urgent care who prescribed him ciprofloxacin for possible urinary tract infection.  Patient states he has 2 days of this left.  Patient states that hematuria has significantly decreased however does note some still.  However patient states he has ongoing right lower quadrant abdominal pain.  Patient states the pain has been constant however the intensity is intermittent.  Radiates from right flank to right lower quadrant.  Denies any testicular pain or swelling.  Patient does have a history of nephrolithiasis that was diagnosed last year but is unsure if he ever passed a stone.  Did follow-up with urology at that time who recommended observation.  She denies any change in his bowel habits including melena or hematochezia.  Patient has not been take any pain medications for his symptoms.  Nothing makes better.  He does report intermittent episodes of nausea and vomiting with the pain.  Pt denies any fever, chill, ha, vision changes, lightheadedness, dizziness, congestion, neck pain, cp, sob, cough,  change in bowel habits, melena, hematochezia, lower extremity paresthesias.   Past Medical History:  Diagnosis Date  . Diverticulitis of colon with perforation 07/30/2014   acute diverticulititis with microperforation  . Dyspnea    with exertion  . Nephrolithiasis   . Pneumonia 1990  . Thigh hematoma  2013    Patient Active Problem List   Diagnosis Date Noted  . Incisional hernia without obstruction or gangrene 11/02/2015  . Incisional hernia 11/02/2015  . Ileostomy in place [facility]) 12/31/2014  . Diverticulitis 09/02/2014  . Diverticulitis of colon with perforation 08/21/2014  . Acute diverticulitis 07/30/2014  . Traumatic hematoma of thigh 03/19/2012    Past Surgical History:  Procedure Laterality Date  . APPENDECTOMY  1981  . COLON SURGERY    . ILEOSTOMY CLOSURE  12/31/2014  . ILEOSTOMY CLOSURE N/A 12/31/2014   Procedure: ILEOSTOMY CLOSURE;  Surgeon: Harriette Bouillon, MD;  Location: [facility] OR;  Service: General;  Laterality: N/A;  . INCISIONAL HERNIA REPAIR  11/02/2015  . INCISIONAL HERNIA REPAIR N/A 11/02/2015   Procedure: LAPAROSCOPIC INCISIONAL HERNIA;  Surgeon: Harriette Bouillon, MD;  Location: [facility] OR;  Service: General;  Laterality: N/A;  . INSERTION OF MESH N/A 11/02/2015   Procedure: INSERTION OF MESH;  Surgeon: Harriette Bouillon, MD;  Location: [facility] OR;  Service: General;  Laterality: N/A;  . LAPAROSCOPIC PARTIAL COLECTOMY N/A 08/26/2014   Procedure: LAPAROSCOPIC ASSISTED PARTIAL COLECTOMY WITH OSTOMY;  Surgeon: Harriette Bouillon, MD;  Location: [facility] OR;  Service: General;  Laterality: N/A;        Home Medications    Prior to Admission medications   Medication Sig Start Date End Date Taking? Authorizing Provider  acetaminophen (TYLENOL) 325 MG tablet Take 2 tablets (650 mg total) by mouth every 6 (six) hours as needed for mild pain (or temp > 100). Patient not taking: Reported on 10/22/2015 08/31/14   Nonie Hoyer, PA-C  acetaminophen (TYLENOL) 500 MG tablet Take  1,000 mg by mouth every 6 (six) hours as needed for headache.    [provider]  minocycline (MINOCIN,DYNACIN) 50 MG capsule Take 50 mg by mouth 2 (two) times daily. 12/11/14   [provider]  ondansetron (ZOFRAN-ODT) 4 MG disintegrating tablet Take 1 tablet (4 mg total) by mouth every 6 (six) hours as  needed for nausea. Patient not taking: Reported on 10/22/2015 01/06/15   Harriette Bouillon, MD  ondansetron (ZOFRAN-ODT) 4 MG disintegrating tablet Take 1 tablet (4 mg total) by mouth every 6 (six) hours as needed for nausea. 11/04/15   Cornett, Maisie Fus, MD  oxyCODONE (OXY IR/ROXICODONE) 5 MG immediate release tablet Take 1-2 tablets (5-10 mg total) by mouth every 4 (four) hours as needed for moderate pain. 11/04/15   Cornett, Maisie Fus, MD  polyethylene glycol (MIRALAX / GLYCOLAX) packet Take 17 g by mouth daily. 11/04/15   Cornett, Maisie Fus, MD  simethicone (MYLICON) 80 MG chewable tablet Chew 0.5 tablets (40 mg total) by mouth every 6 (six) hours as needed for flatulence (bloating). 11/04/15   Harriette Bouillon, MD    Family History Family History  Problem Relation Age of Onset  . Cancer Father        lung    Social History Social History   Tobacco Use  . Smoking status: Never Smoker  . Smokeless tobacco: Never Used  Substance Use Topics  . Alcohol use: No  . Drug use: No     Allergies   Adhesive [tape] and No known allergies   Review of Systems Review of Systems  All other systems reviewed and are negative.    Physical Exam Updated Vital Signs BP (!) 122/96 (BP Location: Right Arm)   Pulse 84   Temp 98.9 F (37.2 C) (Oral)   Resp 20   Ht  (1.778 m)   Wt 131.5 kg (290 lb)   SpO2 96%   BMI 41.61 kg/m   Physical Exam  Constitutional: He is oriented to person, place, and time. He appears well-developed and well-nourished.  Non-toxic appearance. No distress.  HENT:  Head: Normocephalic and atraumatic.  Mouth/Throat: Oropharynx is clear and moist.  Eyes: Conjunctivae are normal. Right eye exhibits no discharge. Left eye exhibits no discharge.  Neck: Normal range of motion. Neck supple.  Cardiovascular: Normal rate, regular rhythm, normal heart sounds and intact distal pulses. Exam reveals no gallop and no friction rub.  No murmur heard. Pulmonary/Chest: Effort  normal and breath sounds normal. No respiratory distress. He exhibits no tenderness.  Abdominal: Soft. Normal appearance and bowel sounds are normal. He exhibits no distension. There is tenderness in the right lower quadrant. There is CVA tenderness (right). There is no rigidity, no rebound, no guarding, no tenderness at McBurney's point and negative Murphy's sign.  Musculoskeletal: Normal range of motion. He exhibits no tenderness.  Lymphadenopathy:    He has no cervical adenopathy.  Neurological: He is alert and oriented to person, place, and time.  Skin: Skin is warm and dry. Capillary refill takes less than 2 seconds. No rash noted.  Psychiatric: His behavior is normal. Judgment and thought content normal.  Nursing note and vitals reviewed.    ED Treatments / Results  Labs (all labs ordered are listed, but only abnormal results are displayed) Labs Reviewed  URINALYSIS, ROUTINE W REFLEX MICROSCOPIC - Abnormal; Notable for the following components:      Result Value   Hgb urine dipstick MODERATE (*)    Protein, ur 30 (*)  All other components within normal limits  COMPREHENSIVE METABOLIC PANEL - Abnormal; Notable for the following components:   Creatinine, Ser 1.94 (*)    Calcium 8.4 (*)    GFR calc non Af Amer 39 (*)    GFR calc Af Amer 45 (*)    All other components within normal limits  LIPASE, BLOOD - Abnormal; Notable for the following components:   Lipase 52 (*)    All other components within normal limits  CBC WITH DIFFERENTIAL/PLATELET - Abnormal; Notable for the following components:   Hemoglobin 12.5 (*)    HCT 37.8 (*)    All other components within normal limits  URINE CULTURE    EKG None  Radiology Ct Renal Stone Study  Result Date: 05/25/2017 CLINICAL DATA:  Right-sided flank pain with nausea vomiting for 1 week. EXAM: CT ABDOMEN AND PELVIS WITHOUT CONTRAST TECHNIQUE: Multidetector CT imaging of the abdomen and pelvis was performed following the standard  protocol without IV contrast. COMPARISON:  07/16/2015 FINDINGS: Lower chest: No acute abnormality. Hepatobiliary: No focal liver abnormality is seen. No gallstones, gallbladder wall thickening, or biliary dilatation. Pancreas: Unremarkable. No pancreatic ductal dilatation or surrounding inflammatory changes. Spleen: Normal in size without focal abnormality. Adrenals/Urinary Tract: Normal adrenal glands. Mild left perinephric stranding, otherwise normal appearance of the left kidney, left ureter and urinary bladder. Two adjacent calculi within the proximal right ureter, the larger of which measures 6 mm, with associated right proximal hydroureter and right hydronephrosis. Mild right perinephric fat stranding. Stomach/Bowel: Stomach is within normal limits. Post appendectomy, partial left colectomy and ileostomy takedown. No evidence of bowel wall thickening, distention, or inflammatory changes. Vascular/Lymphatic: No significant vascular findings are present. No enlarged abdominal or pelvic lymph nodes. Reproductive: Prostate is unremarkable. Other: Small fat containing hernia in the right paramedian anterior abdominal wall, at the site of prior ileostomy. Adhesion of nondilated small bowel loops to the anterior abdominal wall at the prior ileostomy site is also noted. Musculoskeletal: No acute or significant osseous findings. IMPRESSION: Right obstructive uropathy caused by 2 adjacent calculi within the proximal right ureter. Associated moderate right hydronephrosis and proximal hydroureter with inflammatory perinephric and periureteral fat stranding. Electronically Signed   By: Ted Mcalpine M.D.   On: 05/25/2017 18:50    Procedures Procedures (including critical care time)  Medications Ordered in ED Medications  oxyCODONE-acetaminophen (PERCOCET/ROXICET) 5-325 MG per tablet 1 tablet (1 tablet Oral Given 05/25/17 1049)  oxyCODONE-acetaminophen (PERCOCET/ROXICET) 5-325 MG per tablet 1 tablet (has no  administration in time range)  HYDROmorphone (DILAUDID) injection 0.5 mg (0.5 mg Intravenous Given 05/25/17 1921)  sodium chloride 0.9 % bolus 500 mL (0 mLs Intravenous Stopped 05/25/17 2043)  ondansetron (ZOFRAN) injection 4 mg (4 mg Intravenous Given 05/25/17 1921)     Initial Impression / Assessment and Plan / ED Course  I have reviewed the triage vital signs and the nursing notes.  Pertinent labs & imaging results that were available during my care of the patient were reviewed by me and considered in my medical decision making (see chart for details).     Patient presents to the ED with complaints of right flank pain and hematuria.  Known nephrolithiasis.  Patient was treated with ciprofloxacin last week by urgent care for possible urinary tract infection.  Has 2 additional doses.    Patient overall well-appearing and nontoxic.  Vital signs reassuring.  He is afebrile, no tachycardia or hypotension noted.  Patient does not meet Sirs or sepsis criteria.  Lab work shows  no leukocytosis.  Kidney function is 1.9 however baseline appears to be 1.4-1.6.  Otherwise lab work is reassuring.  UA shows no signs of infection.  CT scan does show  right obstructive uropathy caused by 2 adjacent calculi within the proximal right ureter. Associated moderate right hydronephrosis and proximal hydroureter with inflammatory perinephric and periureteral fat stranding.  Since pain has been very well controlled in the ED.  He is able to urinate.  Tolerating p.o. fluids without any intractable vomiting.  Creatinine is only mildly increased from his baseline.  Given the patient's pain has been tolerable in the ED feel patient can be treated in outpatient setting with follow-up with his urologist on Monday.  I discussed this with my attending who was agreeable with the above plan.  No indication for urological intervention at this time..  Did instruct patient to try to avoid NSAIDs given his elevated kidney  function.  Will send home with pain medication, Zofran and Flomax.  Pt is hemodynamically stable, in NAD, & able to ambulate in the ED. Evaluation does not show pathology that would require ongoing emergent intervention or inpatient treatment. I explained the diagnosis to the patient. Pain has been managed & has no complaints prior to dc. Pt is comfortable with above plan and is stable for discharge at this time. All questions were answered prior to disposition. Strict return precautions for f/u to the ED were discussed. Encouraged follow up with PCP.      Final Clinical Impressions(s) / ED Diagnoses   Final diagnoses:  Ureterolithiasis    ED Discharge Orders        Ordered    oxyCODONE-acetaminophen (PERCOCET/ROXICET) 5-325 MG tablet  Every 4 hours PRN     05/25/17 2050    tamsulosin (FLOMAX) 0.4 MG CAPS capsule  Daily after breakfast     05/25/17 2050    ondansetron (ZOFRAN ODT) 4 MG disintegrating tablet  Every 8 hours PRN     05/25/17 2050       Rise Mu, PA-C 05/25/17 2100    Nira Conn, MD 05/26/17 0110

## 2017-05-25 NOTE — Discharge Instructions (Addendum)
You have been diagnosed with kidney stones. Your kidney function is slightly eleavated. Drink plenty of fluids and this needs to be rechecked next week. Avoid using any NSAIDS such as ibuprofen, motrin, aleve as previously discussed.   Drink plenty of fluids to help you pass the stone.  Use your pain medication as directed and only as needed for severe pain. Taking flomax as directed will also help to pass the stone. Use Zofran for nausea as directed.  Follow up with the urology clinic listed in regards to your hospital visit.   Return to the ED immediately if you develop fever that persists > 101, uncontrolled pain or vomiting, or other concerns.   Do not drink alcohol, drive or participate in any other potentially dangerous activities while taking opiate pain medication as it may make you sleepy. Do not take this medication with any other sedating medications, either prescription or over-the-counter. If you were prescribed Percocet or Vicodin, do not take these with acetaminophen (Tylenol) as it is already contained within these medications.   This medication is an opiate (or narcotic) pain medication and can be habit forming.  Use it as little as possible to achieve adequate pain control.  Do not use or use it with extreme caution if you have a history of opiate abuse or dependence. This medication is intended for your use only - do not give any to anyone else and keep it in a secure place where nobody else, especially children, have access to it. It will also cause or worsen constipation, so you may want to consider taking an over-the-counter stool softener while you are taking this medication.

## 2017-05-25 NOTE — ED Notes (Signed)
Pt complaining of pain, is requesting pain meds.

## 2017-05-25 NOTE — ED Triage Notes (Signed)
Patient to ED c/o RLQ abdominal pain since last Friday, states it worsened last night. Saw PCP and was given ciprofloxacin for UTI - sts he has 2 pills left. Patient reports hx kidney stone a few years ago that he doesn't know if he ever passed. N/V x 1 last Friday, none since. Denies fevers.

## 2017-05-27 LAB — URINE CULTURE: Culture: NO GROWTH

## 2017-06-06 ENCOUNTER — Other Ambulatory Visit: Payer: Self-pay | Admitting: Urology

## 2017-06-07 ENCOUNTER — Other Ambulatory Visit: Payer: Self-pay

## 2017-06-07 ENCOUNTER — Encounter (HOSPITAL_BASED_OUTPATIENT_CLINIC_OR_DEPARTMENT_OTHER): Payer: Self-pay | Admitting: *Deleted

## 2017-06-07 NOTE — H&P (Signed)
Urology Preoperative H&P   Chief Complaint: Right flank pain  History of Present Illness: Bryan [name] is a 47 y.o. male with two 5 mm right mid ureteral stones seen on CTSS from 06/06/17.  He has a one month history of right flank pain and has been followed by Anne Fu, NP.  Today, he reports persistent right flank pain, but denies N/V/F/C, dysuria or hematuria.  UA from 06/06/17 did not show signs of a UTI.     Past Medical History:  Diagnosis Date  . History of diverticulitis of colon 07/30/2014   07-30-2014  acute diverticulitis;  08-21-2014 recurrent diverticulitis w/ microperforation, 08-26-2014 s/p partial colectomy (post-op intra-abdominal abscesses and complication wound healing)  . Hypertension   . Right ureteral stone     Past Surgical History:  Procedure Laterality Date  . APPENDECTOMY  1981  . ILEOSTOMY CLOSURE N/A 12/31/2014   Procedure: ILEOSTOMY CLOSURE;  Surgeon: Harriette Bouillon, MD;  Location: [facility] OR;  Service: General;  Laterality: N/A;  . INCISIONAL HERNIA REPAIR N/A 11/02/2015   Procedure: LAPAROSCOPIC INCISIONAL HERNIA;  Surgeon: Harriette Bouillon, MD;  Location: [facility] OR;  Service: General;  Laterality: N/A;  . INSERTION OF MESH N/A 11/02/2015   Procedure: INSERTION OF MESH;  Surgeon: Harriette Bouillon, MD;  Location: [facility] OR;  Service: General;  Laterality: N/A;  . LAPAROSCOPIC PARTIAL COLECTOMY N/A 08/26/2014   Procedure: LAPAROSCOPIC ASSISTED PARTIAL COLECTOMY WITH OSTOMY;  Surgeon: Harriette Bouillon, MD;  Location: [facility] OR;  Service: General;  Laterality: N/A;    Allergies:  Allergies  Allergen Reactions  . Adhesive [Tape] Hives    NO PLASTIC tape, please!!    Family History  Problem Relation Age of Onset  . Cancer Father        lung    Social History:  reports that he has never smoked. He has never used smokeless tobacco. He reports that he does not drink alcohol or use drugs.  ROS: A complete review of systems was performed.  All systems are negative  except for pertinent findings as noted.  Physical Exam:  Vital signs in last 24 hours: Weight:  [129.3 kg (285 lb)] 129.3 kg (285 lb) (05/23 1610) Constitutional:  Alert and oriented, No acute distress Cardiovascular: Regular rate and rhythm, No JVD Respiratory: Normal respiratory effort, Lungs clear bilaterally GI: Abdomen is soft, nontender, nondistended, no abdominal masses GU: right CVA tenderness Lymphatic: No lymphadenopathy Neurologic: Grossly intact, no focal deficits Psychiatric: Normal mood and affect  Laboratory Data:  No results for input(s): WBC, HGB, HCT, PLT in the last 72 hours.  No results for input(s): NA, K, CL, GLUCOSE, BUN, CALCIUM, CREATININE in the last 72 hours.  Invalid input(s): CO3   No results found for this or any previous visit (from the past 24 hour(s)). No results found for this or any previous visit (from the past 240 hour(s)).  Renal Function: No results for input(s): CREATININE in the last 168 hours. Estimated Creatinine Clearance: 62.2 mL/min (A) (by C-G formula based on SCr of 1.94 mg/dL (H)).  Radiologic Imaging: No results found.  I independently reviewed the above imaging studies.  Assessment and Plan Bryan [name] is a 47 y.o. male with two 5 mm right midureteral stones causing right hydronephrosis and AKI  The risks, benefits and alternatives of cystoscopy with RIGHT ureteroscopy, laser lithotripsy and ureteral stent placement was discussed the patient.  Risks included, but are not limited to: bleeding, urinary tract infection, ureteral injury/avulsion, ureteral stricture formation, retained stone fragments,  the possibility that multiple surgeries may be required to treat the stone(s), MI, stroke, PE and the inherent risks of general anesthesia.  The patient voices understanding and wishes to proceed.      Rhoderick Moody, MD 06/07/2017, 5:43 PM  [facility] Pager: [phone]

## 2017-06-07 NOTE — Progress Notes (Signed)
SPOKE W/ PT VIA PHONE FOR PRE-OP INTERVIEW.  NPO AFTER MN W/ EXCEPTION CLEAR LIQUIDS UNTIL 0745 (NO CREAM/ MILK PRODUCTS).  ARRIVE AT 1145.  NEEDS EKG.  CURRENT LAB RESULTS, DATED 05-25-2017, IN CHART AND Epic.  WILL TAKE FLOMAX AM DOS W/ SIPS OF WATER AND IF NEEDED TAKE PERCOCET/ ZOFRAN.

## 2017-06-08 ENCOUNTER — Ambulatory Visit (HOSPITAL_BASED_OUTPATIENT_CLINIC_OR_DEPARTMENT_OTHER)
Admission: RE | Admit: 2017-06-08 | Discharge: 2017-06-08 | Disposition: A | Discharge status: Home / Self Care | Payer: BLUE CROSS/BLUE SHIELD | Source: Ambulatory Visit | Attending: Urology | Admitting: Urology

## 2017-06-08 ENCOUNTER — Encounter (HOSPITAL_BASED_OUTPATIENT_CLINIC_OR_DEPARTMENT_OTHER): Payer: Self-pay | Admitting: Certified Registered"

## 2017-06-08 ENCOUNTER — Ambulatory Visit (HOSPITAL_BASED_OUTPATIENT_CLINIC_OR_DEPARTMENT_OTHER): Payer: BLUE CROSS/BLUE SHIELD | Admitting: Certified Registered"

## 2017-06-08 ENCOUNTER — Other Ambulatory Visit: Payer: Self-pay

## 2017-06-08 ENCOUNTER — Encounter (HOSPITAL_BASED_OUTPATIENT_CLINIC_OR_DEPARTMENT_OTHER)
Admission: RE | Disposition: A | Discharge status: Home / Self Care | Payer: Self-pay | Source: Ambulatory Visit | Attending: Urology

## 2017-06-08 DIAGNOSIS — N132 Hydronephrosis with renal and ureteral calculous obstruction: Secondary | ICD-10-CM | POA: Insufficient documentation

## 2017-06-08 DIAGNOSIS — Z888 Allergy status to other drugs, medicaments and biological substances status: Secondary | ICD-10-CM | POA: Insufficient documentation

## 2017-06-08 DIAGNOSIS — I10 Essential (primary) hypertension: Secondary | ICD-10-CM | POA: Insufficient documentation

## 2017-06-08 DIAGNOSIS — N179 Acute kidney failure, unspecified: Secondary | ICD-10-CM | POA: Diagnosis not present

## 2017-06-08 HISTORY — DX: Calculus of ureter: N20.1

## 2017-06-08 HISTORY — PX: CYSTOSCOPY/URETEROSCOPY/HOLMIUM LASER/STENT PLACEMENT: SHX6546

## 2017-06-08 HISTORY — DX: Essential (primary) hypertension: I10

## 2017-06-08 HISTORY — DX: Personal history of other diseases of the digestive system: Z87.19

## 2017-06-08 SURGERY — CYSTOSCOPY/URETEROSCOPY/HOLMIUM LASER/STENT PLACEMENT
Anesthesia: General | Laterality: Right

## 2017-06-08 MED ORDER — OXYCODONE HCL 5 MG PO TABS
ORAL_TABLET | ORAL | Status: AC
  Filled 2017-06-08: qty 1

## 2017-06-08 MED ORDER — PROMETHAZINE HCL 25 MG/ML IJ SOLN
6.2500 mg | INTRAMUSCULAR | Status: DC | PRN
  Filled 2017-06-08: qty 1

## 2017-06-08 MED ORDER — MEPERIDINE HCL 25 MG/ML IJ SOLN
6.2500 mg | INTRAMUSCULAR | Status: DC | PRN
  Filled 2017-06-08: qty 1

## 2017-06-08 MED ORDER — ONDANSETRON HCL 4 MG/2ML IJ SOLN
INTRAMUSCULAR | Status: AC
  Filled 2017-06-08: qty 2

## 2017-06-08 MED ORDER — DEXAMETHASONE SODIUM PHOSPHATE 10 MG/ML IJ SOLN
INTRAMUSCULAR | Status: AC
  Filled 2017-06-08: qty 1

## 2017-06-08 MED ORDER — HYDROMORPHONE HCL 1 MG/ML IJ SOLN
INTRAMUSCULAR | Status: AC
  Filled 2017-06-08: qty 1

## 2017-06-08 MED ORDER — KETOROLAC TROMETHAMINE 30 MG/ML IJ SOLN
INTRAMUSCULAR | Status: DC | PRN
  Administered 2017-06-08: 30 mg via INTRAVENOUS

## 2017-06-08 MED ORDER — FUROSEMIDE 10 MG/ML IJ SOLN
INTRAMUSCULAR | Status: DC | PRN
  Administered 2017-06-08: 20 mg via INTRAMUSCULAR

## 2017-06-08 MED ORDER — PROPOFOL 10 MG/ML IV BOLUS
INTRAVENOUS | Status: DC | PRN
  Administered 2017-06-08: 200 mg via INTRAVENOUS

## 2017-06-08 MED ORDER — FUROSEMIDE 10 MG/ML IJ SOLN
INTRAMUSCULAR | Status: AC
  Filled 2017-06-08: qty 2

## 2017-06-08 MED ORDER — FENTANYL CITRATE (PF) 100 MCG/2ML IJ SOLN
INTRAMUSCULAR | Status: DC | PRN
  Administered 2017-06-08 (×3): 50 ug via INTRAVENOUS

## 2017-06-08 MED ORDER — HYDROMORPHONE HCL 1 MG/ML IJ SOLN
0.2500 mg | INTRAMUSCULAR | Status: DC | PRN
  Administered 2017-06-08: 0.25 mg via INTRAVENOUS
  Filled 2017-06-08: qty 0.5

## 2017-06-08 MED ORDER — PROPOFOL 10 MG/ML IV BOLUS
INTRAVENOUS | Status: AC
  Filled 2017-06-08: qty 20

## 2017-06-08 MED ORDER — PHENYLEPHRINE 40 MCG/ML (10ML) SYRINGE FOR IV PUSH (FOR BLOOD PRESSURE SUPPORT)
PREFILLED_SYRINGE | INTRAVENOUS | Status: DC | PRN
Start: 2017-06-08 — End: 2017-06-08
  Administered 2017-06-08 (×2): 80 ug via INTRAVENOUS

## 2017-06-08 MED ORDER — LIDOCAINE 2% (20 MG/ML) 5 ML SYRINGE
INTRAMUSCULAR | Status: AC
  Filled 2017-06-08: qty 5

## 2017-06-08 MED ORDER — DEXAMETHASONE SODIUM PHOSPHATE 10 MG/ML IJ SOLN
INTRAMUSCULAR | Status: DC | PRN
  Administered 2017-06-08: 10 mg via INTRAVENOUS

## 2017-06-08 MED ORDER — CEFAZOLIN SODIUM-DEXTROSE 2-4 GM/100ML-% IV SOLN
INTRAVENOUS | Status: AC
  Filled 2017-06-08: qty 100

## 2017-06-08 MED ORDER — IOHEXOL 300 MG/ML  SOLN
INTRAMUSCULAR | Status: DC | PRN
  Administered 2017-06-08: 6 mL

## 2017-06-08 MED ORDER — LACTATED RINGERS IV SOLN
INTRAVENOUS | Status: DC
  Administered 2017-06-08: 13:00:00 via INTRAVENOUS
  Filled 2017-06-08: qty 1000

## 2017-06-08 MED ORDER — ONDANSETRON HCL 4 MG/2ML IJ SOLN
INTRAMUSCULAR | Status: DC | PRN
  Administered 2017-06-08: 4 mg via INTRAVENOUS

## 2017-06-08 MED ORDER — SODIUM CHLORIDE 0.9 % IR SOLN
Status: DC | PRN
  Administered 2017-06-08: 3000 mL

## 2017-06-08 MED ORDER — BELLADONNA ALKALOIDS-OPIUM 16.2-60 MG RE SUPP
RECTAL | Status: AC
Start: 2017-06-08 — End: ?
  Filled 2017-06-08: qty 1

## 2017-06-08 MED ORDER — PHENAZOPYRIDINE HCL 200 MG PO TABS: 200.0000 mg | ORAL_TABLET | Freq: Three times a day (TID) | ORAL | 0 refills | Status: DC | PRN

## 2017-06-08 MED ORDER — MIDAZOLAM HCL 2 MG/2ML IJ SOLN
INTRAMUSCULAR | Status: AC
  Filled 2017-06-08: qty 2

## 2017-06-08 MED ORDER — OXYCODONE HCL 5 MG/5ML PO SOLN
5.0000 mg | Freq: Once | ORAL | Status: AC | PRN
  Filled 2017-06-08: qty 5

## 2017-06-08 MED ORDER — FENTANYL CITRATE (PF) 100 MCG/2ML IJ SOLN
INTRAMUSCULAR | Status: AC
  Filled 2017-06-08: qty 2

## 2017-06-08 MED ORDER — MIDAZOLAM HCL 5 MG/5ML IJ SOLN
INTRAMUSCULAR | Status: DC | PRN
  Administered 2017-06-08: 2 mg via INTRAVENOUS

## 2017-06-08 MED ORDER — OXYCODONE HCL 5 MG PO TABS
5.0000 mg | ORAL_TABLET | Freq: Once | ORAL | Status: AC | PRN
  Administered 2017-06-08: 5 mg via ORAL
  Filled 2017-06-08: qty 1

## 2017-06-08 MED ORDER — PHENYLEPHRINE 40 MCG/ML (10ML) SYRINGE FOR IV PUSH (FOR BLOOD PRESSURE SUPPORT)
PREFILLED_SYRINGE | INTRAVENOUS | Status: AC
  Filled 2017-06-08: qty 10

## 2017-06-08 MED ORDER — KETOROLAC TROMETHAMINE 30 MG/ML IJ SOLN
INTRAMUSCULAR | Status: AC
  Filled 2017-06-08: qty 1

## 2017-06-08 MED ORDER — LIDOCAINE 2% (20 MG/ML) 5 ML SYRINGE
INTRAMUSCULAR | Status: DC | PRN
  Administered 2017-06-08: 80 mg via INTRAVENOUS

## 2017-06-08 MED ORDER — CEFAZOLIN SODIUM-DEXTROSE 2-4 GM/100ML-% IV SOLN
2.0000 g | Freq: Once | INTRAVENOUS | Status: AC
  Administered 2017-06-08: 2 g via INTRAVENOUS
  Filled 2017-06-08: qty 100

## 2017-06-08 MED ORDER — BELLADONNA ALKALOIDS-OPIUM 16.2-60 MG RE SUPP
RECTAL | Status: DC | PRN
  Administered 2017-06-08: 1 via RECTAL

## 2017-06-08 MED ORDER — CEPHALEXIN 500 MG PO CAPS: 500.0000 mg | ORAL_CAPSULE | Freq: Three times a day (TID) | ORAL | 0 refills | Status: AC

## 2017-06-08 MED FILL — CEPHALEXIN 500 MG CAPSULE: 500 | 5 days supply | Qty: 15 | Fill #0

## 2017-06-08 MED FILL — PHENAZOPYRIDINE 200 MG TAB: 200 | 10 days supply | Qty: 30 | Fill #0

## 2017-06-08 SURGICAL SUPPLY — 28 items
BAG DRAIN URO-CYSTO SKYTR STRL (DRAIN) ×3 IMPLANT
BASKET STONE 1.7 NGAGE (UROLOGICAL SUPPLIES) IMPLANT
BASKET ZERO TIP NITINOL 2.4FR (BASKET) ×3 IMPLANT
BENZOIN TINCTURE PRP APPL 2/3 (GAUZE/BANDAGES/DRESSINGS) IMPLANT
CATH URET 5FR 28IN OPEN ENDED (CATHETERS) ×3 IMPLANT
CLOSURE WOUND 1/2 X4 (GAUZE/BANDAGES/DRESSINGS)
CLOTH BEACON ORANGE TIMEOUT ST (SAFETY) ×3 IMPLANT
FIBER LASER FLEXIVA 365 (UROLOGICAL SUPPLIES) IMPLANT
FIBER LASER TRAC TIP (UROLOGICAL SUPPLIES) ×3 IMPLANT
GLOVE BIO SURGEON STRL SZ 6.5 (GLOVE) ×2 IMPLANT
GLOVE BIO SURGEON STRL SZ7.5 (GLOVE) ×3 IMPLANT
GLOVE BIO SURGEONS STRL SZ 6.5 (GLOVE) ×1
GLOVE BIOGEL PI IND STRL 6.5 (GLOVE) ×1 IMPLANT
GLOVE BIOGEL PI INDICATOR 6.5 (GLOVE) ×2
GOWN STRL REUS W/TWL LRG LVL3 (GOWN DISPOSABLE) ×3 IMPLANT
GOWN STRL REUS W/TWL XL LVL3 (GOWN DISPOSABLE) ×3 IMPLANT
GUIDEWIRE ZIPWRE .038 STRAIGHT (WIRE) ×3 IMPLANT
IV NS IRRIG 3000ML ARTHROMATIC (IV SOLUTION) ×3 IMPLANT
KIT TURNOVER CYSTO (KITS) ×3 IMPLANT
MANIFOLD NEPTUNE II (INSTRUMENTS) ×3 IMPLANT
NS IRRIG 500ML POUR BTL (IV SOLUTION) ×3 IMPLANT
PACK CYSTO (CUSTOM PROCEDURE TRAY) ×3 IMPLANT
STENT URET 6FRX26 CONTOUR (STENTS) ×3 IMPLANT
STRIP CLOSURE SKIN 1/2X4 (GAUZE/BANDAGES/DRESSINGS) IMPLANT
SYRINGE 10CC LL (SYRINGE) ×3 IMPLANT
TUBE CONNECTING 12'X1/4 (SUCTIONS) ×1
TUBE CONNECTING 12X1/4 (SUCTIONS) ×2 IMPLANT
TUBING UROLOGY SET (TUBING) ×3 IMPLANT

## 2017-06-08 NOTE — Anesthesia Preprocedure Evaluation (Addendum)
Anesthesia Evaluation  Patient identified by MRN, date of birth, ID band Patient awake    Reviewed: Allergy & Precautions, NPO status , Patient's Chart, lab work & pertinent test results  Airway Mallampati: II  TM Distance: >3 FB     Dental   Pulmonary neg pulmonary ROS,    breath sounds clear to auscultation       Cardiovascular hypertension,  Rhythm:Regular Rate:Normal     Neuro/Psych    GI/Hepatic negative GI ROS, Neg liver ROS,   Endo/Other  negative endocrine ROS  Renal/GU negative Renal ROS     Musculoskeletal   Abdominal   Peds  Hematology   Anesthesia Other Findings   Reproductive/Obstetrics                             Anesthesia Physical Anesthesia Plan  ASA: III  Anesthesia Plan: General   Post-op Pain Management:    Induction: Intravenous  PONV Risk Score and Plan: Treatment may vary due to age or medical condition, Ondansetron, Dexamethasone and Midazolam  Airway Management Planned: LMA  Additional Equipment:   Intra-op Plan:   Post-operative Plan: Extubation in OR  Informed Consent: I have reviewed the patients History and Physical, chart, labs and discussed the procedure including the risks, benefits and alternatives for the proposed anesthesia with the patient or authorized representative who has indicated his/her understanding and acceptance.   Dental advisory given  Plan Discussed with: CRNA and Anesthesiologist  Anesthesia Plan Comments:         Anesthesia Quick Evaluation

## 2017-06-08 NOTE — Op Note (Signed)
Operative Note  Preoperative diagnosis:  1.  Two, 5 mm right mid ureteral stones 2.  Right hydronephrosis 3.  Acute kidney injury 4.  Right renal colic  Postoperative diagnosis: 1.  Two, 5 mm right mid ureteral stones 2.  Right hydronephrosis 3.  Acute kidney injury 4.  Right renal colic  Procedure(s): 1.  Cystoscopy 2.  Right retrograde pyelogram with intraoperative interpretation of fluoroscopic imaging 3.  Right ureteroscopy 4.  Right holmium laser lithotripsy 5.  Right JJ stent placement  Surgeon: Rhoderick Moody, MD  Assistants:  None   Anesthesia:  Gen  Complications:  None  EBL:  <5 mL  Specimens: 1. Right ureteral stone  Drains/Catheters: 1.  Right 6 French x 26 cm JJ stent w/o tether  Intraoperative findings:   1. Obstructing right mid-ureteral stones migrated into the right renal pelvis following RPG 2. Solitary right collecting system with no filling defects within the ureter, but there was dilation dilation of the right renal pelvis seen on retrograde pyelogram  Indication:  Bryan [name] is a 47 y.o. male with right flank pain and CT evidence of two, 5 mm right mid-ureteral stones resulting in right sided hydronephrosis.  He has been consented for the above procedures, voices understanding and wishes to proceed  Description of procedure: After informed consent was obtained, the patient was brought to the operating room and general LMA anesthesia was administered. The patient was then placed in the dorsolithotomy position and prepped and draped in usual sterile fashion. A timeout was performed. A 23 French rigid cystoscope was then inserted into the urethral meatus and advanced into the bladder under direct vision. A complete bladder survey revealed no intravesical pathology.  A 5 French open-ended ureteral catheter was then inserted into the right ureteral orifice and a right retrograde pyelogram was obtained, with the findings listed above.  A  Glidewire was then advanced through the lumen of the ureteral catheter and up to the right renal pelvis, under fluoroscopic guidance.  The rigid cystoscope was then exchanged for a semirigid ureteroscope, which was advanced into the bladder and up the right ureter, but his stones migrated up to the right renal pelvis.  The semirigid ureteroscope was then exchanged for a flexible ureteroscope, which was advanced over the wire and into the right renal pelvis.  Full inspection of the right renal pelvis identified his kidney stones within a midpole calyx.  There was no other pathology identified within the right renal pelvis.   A 200 m holmium laser was then used to dust the stone into numerous smaller pieces.  The flexible ureteroscope was then removed under direct vision, leaving the wire in place.  A 6 French x 26 cm right JJ stent was then placed over the wire and into good position within the right collecting system, confirming placement via fluoroscopy.  The patient's bladder was then drained.  He tolerated the procedure well and was transferred to the postanesthesia unit in stable condition.  Plan: Follow-up on 06/15/2017 for office cystoscopy and right JJ stent removal

## 2017-06-08 NOTE — Interval H&P Note (Signed)
History and Physical Interval Note:  06/08/2017 11:57 AM  Bryan [name]  has presented today for surgery, with the diagnosis of RIGHT URETERAL STONE  The various methods of treatment have been discussed with the patient and family. After consideration of risks, benefits and other options for treatment, the patient has consented to  Procedure(s) with comments: CYSTOSCOPY/RETROGRADE/URETEROSCOPY/HOLMIUM LASER/STENT PLACEMENT (Right) - ONLY NEEDS 45 MIN as a surgical intervention .  The patient's history has been reviewed, patient examined, no change in status, stable for surgery.  I have reviewed the patient's chart and labs.  Questions were answered to the patient's satisfaction.     Bryan [name]

## 2017-06-08 NOTE — Anesthesia Procedure Notes (Signed)
Procedure Name: LMA Insertion Date/Time: 06/08/2017 1:57 PM Performed by: Marny Lowenstein, CRNA Pre-anesthesia Checklist: Patient identified, Emergency Drugs available, Suction available, Patient being monitored and Timeout performed Patient Re-evaluated:Patient Re-evaluated prior to induction Oxygen Delivery Method: Circle system utilized Preoxygenation: Pre-oxygenation with 100% oxygen Induction Type: IV induction Ventilation: Mask ventilation without difficulty LMA: LMA with gastric port inserted LMA Size: 5.0 Number of attempts: 1 Airway Equipment and Method: Patient positioned with wedge pillow Placement Confirmation: positive ETCO2,  CO2 detector and breath sounds checked- equal and bilateral Tube secured with: Tape Dental Injury: Teeth and Oropharynx as per pre-operative assessment

## 2017-06-08 NOTE — Discharge Instructions (Signed)
[facility] [phone] Post Ureteroscopy With or Without Stent Instructions  Definitions:  Ureter: The duct that transports urine from the kidney to the bladder. Stent:   A plastic hollow tube that is placed into the ureter, from the kidney to the    bladder to prevent the ureter from swelling shut.  GENERAL INSTRUCTIONS:  Despite the fact that no skin incisions were used, the area around the ureter and bladder is raw and irritated. The stent is a foreign body which will further irritate the bladder wall. This irritation is manifested by increased frequency of urination, both day and night, and by an increase in the urge to urinate. In some, the urge to urinate is present almost always. Sometimes the urge is strong enough that you may not be able to stop yourself from urinating. The only real cure is to remove the stent and then give time for the bladder wall to heal which can't be done until the danger of the ureter swelling shut has passed, which varies.  You may see some blood in your urine while the stent is in place and a few days afterwards. Do not be alarmed, even if the urine was clear for a while. Get off your feet and drink lots of fluids until clearing occurs. If you start to pass clots or don't improve, call us.  DIET: You may return to your normal diet immediately. Because of the raw surface of your bladder, alcohol, spicy foods, acid type foods and drinks with caffeine may cause irritation or frequency and should be used in moderation. To keep your urine flowing freely and to avoid constipation, drink plenty of fluids during the day ( 8-10 glasses ). Tip: Avoid cranberry juice because it is very acidic.  ACTIVITY: Your physical activity doesn't need to be restricted. However, if you are very active, you may see some blood in your urine. We suggest that you reduce your activity under these circumstances until the bleeding has stopped.  BOWELS: It is important to  keep your bowels regular during the postoperative period. Straining with bowel movements can cause bleeding. A bowel movement every other day is reasonable. Use a mild laxative if needed, such as Milk of Magnesia 2-3 tablespoons, or 2 Dulcolax tablets. Call if you continue to have problems. If you have been taking narcotics for pain, before, during or after your surgery, you may be constipated. Take a laxative if necessary.   MEDICATION: You should resume your pre-surgery medications unless told not to. In addition you will often be given an antibiotic to prevent infection. These should be taken as prescribed until the bottles are finished unless you are having an unusual reaction to one of the drugs.  PROBLEMS YOU SHOULD REPORT TO [place]:  Fevers over 100.5 Fahrenheit.  Heavy bleeding, or clots ( See above notes about blood in urine ).  Inability to urinate.  Drug reactions ( hives, rash, nausea, vomiting, diarrhea ).  Severe burning or pain with urination that is not improving.  FOLLOW-UP: You will need a follow-up appointment to monitor your progress. Call for this appointment at the number listed above. Usually the first appointment will be about three to fourteen days after your surgery.  Next dose aleve,advil or motrin after 8:30 pm as needed.   Post Anesthesia Home Care Instructions  Activity: Get plenty of rest for the remainder of the day. A responsible individual must stay with you for 24 hours following the procedure.  For the next 24 hours,  DO NOT: -Drive a car -Advertising copywriter -Drink alcoholic beverages -Take any medication unless instructed by your physician -Make any legal decisions or sign important papers.  Meals: Start with liquid foods such as gelatin or soup. Progress to regular foods as tolerated. Avoid greasy, spicy, heavy foods. If nausea and/or vomiting occur, drink only clear liquids until the nausea and/or vomiting subsides. Call your physician if vomiting  continues.  Special Instructions/Symptoms: Your throat may feel dry or sore from the anesthesia or the breathing tube placed in your throat during surgery. If this causes discomfort, gargle with warm salt water. The discomfort should disappear within 24 hours.  If you had a scopolamine patch placed behind your ear for the management of post- operative nausea and/or vomiting:  1. The medication in the patch is effective for 72 hours, after which it should be removed.  Wrap patch in a tissue and discard in the trash. Wash hands thoroughly with soap and water. 2. You may remove the patch earlier than 72 hours if you experience unpleasant side effects which may include dry mouth, dizziness or visual disturbances. 3. Avoid touching the patch. Wash your hands with soap and water after contact with the patch.   Post Anesthesia Home Care Instructions  Activity: Get plenty of rest for the remainder of the day. A responsible individual must stay with you for 24 hours following the procedure.  For the next 24 hours, DO NOT: -Drive a car -Advertising copywriter -Drink alcoholic beverages -Take any medication unless instructed by your physician -Make any legal decisions or sign important papers.  Meals: Start with liquid foods such as gelatin or soup. Progress to regular foods as tolerated. Avoid greasy, spicy, heavy foods. If nausea and/or vomiting occur, drink only clear liquids until the nausea and/or vomiting subsides. Call your physician if vomiting continues.  Special Instructions/Symptoms: Your throat may feel dry or sore from the anesthesia or the breathing tube placed in your throat during surgery. If this causes discomfort, gargle with warm salt water. The discomfort should disappear within 24 hours.  If you had a scopolamine patch placed behind your ear for the management of post- operative nausea and/or vomiting:  1. The medication in the patch is effective for 72 hours, after which it should  be removed.  Wrap patch in a tissue and discard in the trash. Wash hands thoroughly with soap and water. 2. You may remove the patch earlier than 72 hours if you experience unpleasant side effects which may include dry mouth, dizziness or visual disturbances. 3. Avoid touching the patch. Wash your hands with soap and water after contact with the patch.

## 2017-06-08 NOTE — Transfer of Care (Signed)
Immediate Anesthesia Transfer of Care Note  Patient: Bryan [name]  Procedure(s) Performed: CYSTOSCOPY/RETROGRADE/URETEROSCOPY/HOLMIUM LASER/STENT PLACEMENT (Right )  Patient Location: PACU  Anesthesia Type:General  Level of Consciousness: awake, alert  and oriented  Airway & Oxygen Therapy: Patient Spontanous Breathing and Patient connected to nasal cannula oxygen  Post-op Assessment: Report given to RN and Post -op Vital signs reviewed and stable  Post vital signs: Reviewed and stable  Last Vitals:  Vitals Value Taken Time  BP 147/90 06/08/2017  2:48 PM  Temp    Pulse 103 06/08/2017  2:50 PM  Resp 17 06/08/2017  2:50 PM  SpO2 99 % 06/08/2017  2:50 PM  Vitals shown include unvalidated device data.  Last Pain:  Vitals:   06/08/17 1142  TempSrc: Oral         Complications: No apparent anesthesia complications

## 2017-06-08 NOTE — Anesthesia Postprocedure Evaluation (Signed)
Anesthesia Post Note  Patient: Bryan [name]  Procedure(s) Performed: CYSTOSCOPY/RETROGRADE/URETEROSCOPY/HOLMIUM LASER/STENT PLACEMENT (Right )     Patient location during evaluation: PACU Anesthesia Type: General Level of consciousness: awake and alert Pain management: pain level controlled Vital Signs Assessment: post-procedure vital signs reviewed and stable Respiratory status: spontaneous breathing, nonlabored ventilation and respiratory function stable Cardiovascular status: blood pressure returned to baseline and stable Postop Assessment: no apparent nausea or vomiting Anesthetic complications: no    Last Vitals:  Vitals:   06/08/17 1545 06/08/17 1624  BP: (!) 152/95 (!) 149/98  Pulse: 83 83  Resp: 16 16  Temp:  36.8 C  SpO2: 93% 98%    Last Pain:  Vitals:   06/08/17 1615  TempSrc:   PainSc: 4                  Lowella Curb

## 2017-06-12 ENCOUNTER — Encounter (HOSPITAL_BASED_OUTPATIENT_CLINIC_OR_DEPARTMENT_OTHER): Payer: Self-pay | Admitting: Urology

## 2017-06-18 IMAGING — CR DG BE W/ CM - WO/W KUB
13 of 24 series · 13 of 24 positions shown · non-contrast
Comparison: KUB barium enema of 10/07/2014

CLINICAL DATA: History of resection of a portion of the sigmoid
colon for diverticulitis with a the small leak at the time of the
prior study, followup

[run (1 of 9)]
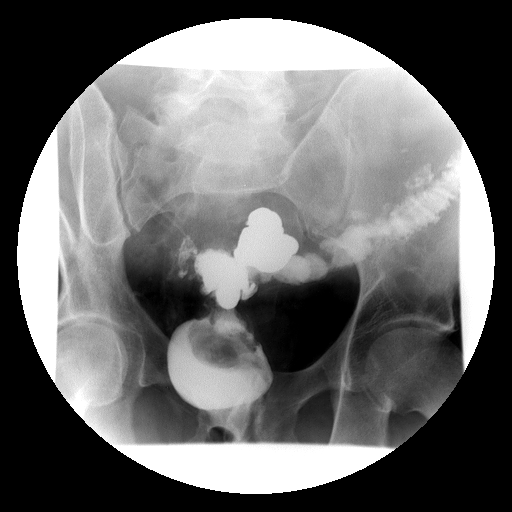

[run (2 of 9)]
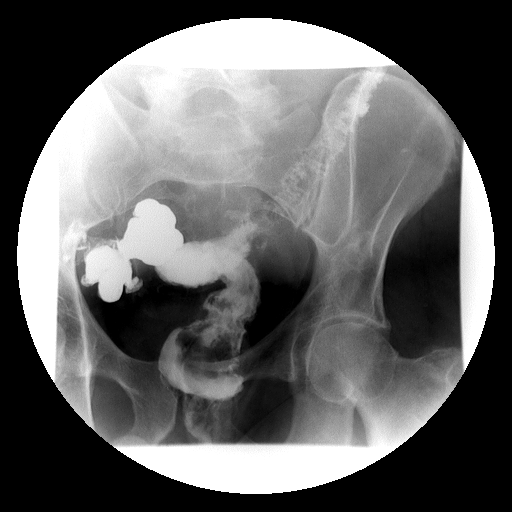

[run (3 of 9)]
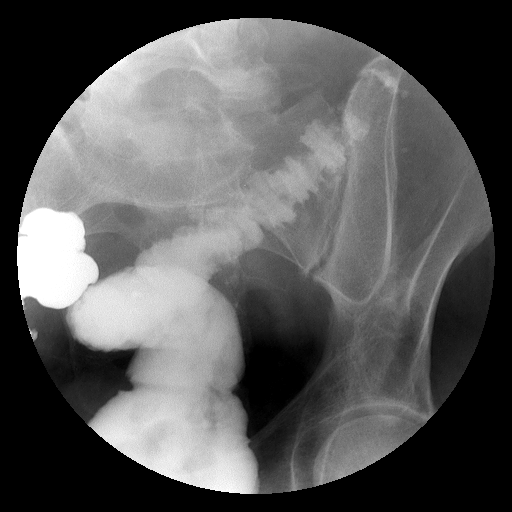

[run (4 of 9)]
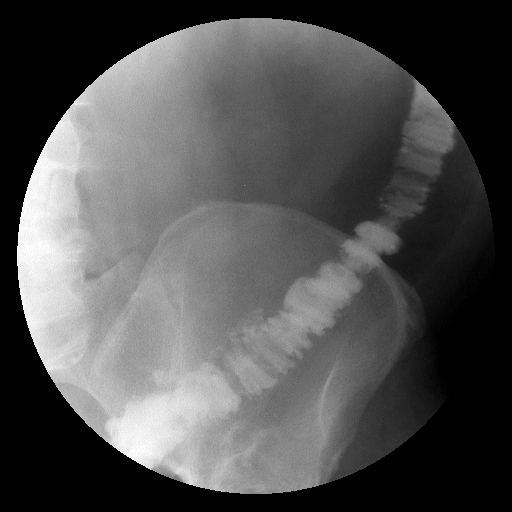

[run (5 of 9)]
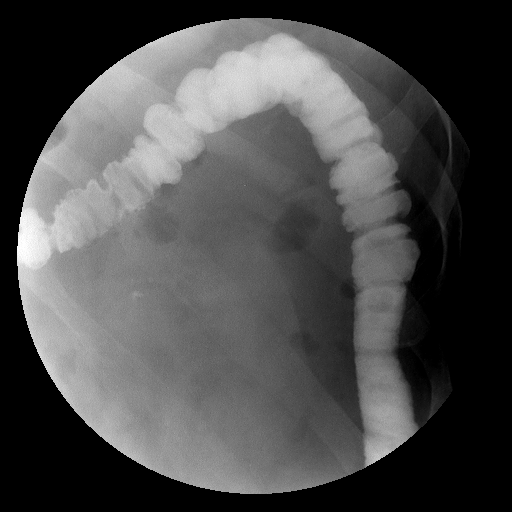

[run (6 of 9)]
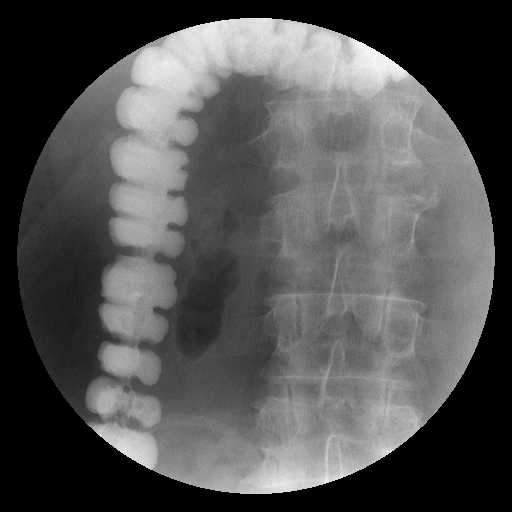

[run (7 of 9)]
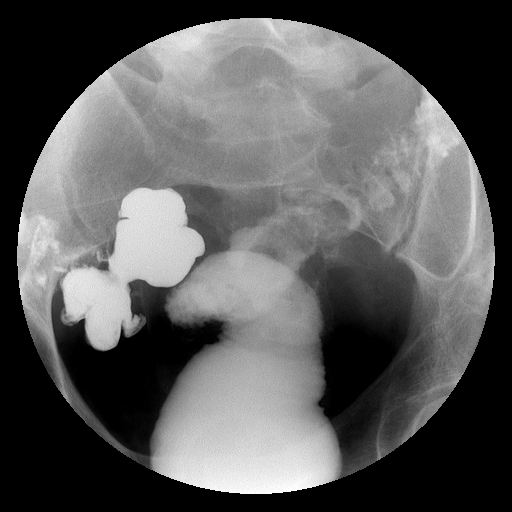

[run (8 of 9)]
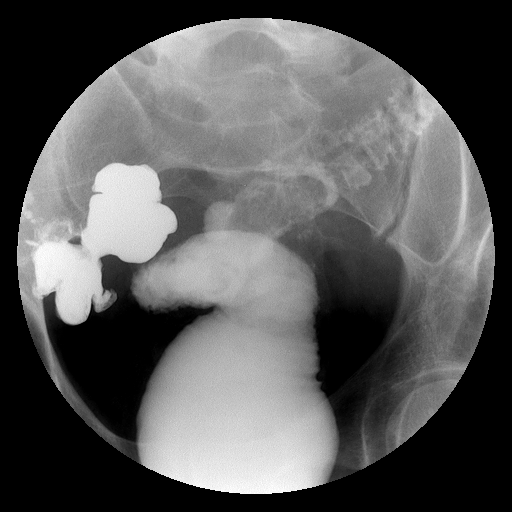

[run (9 of 9)]
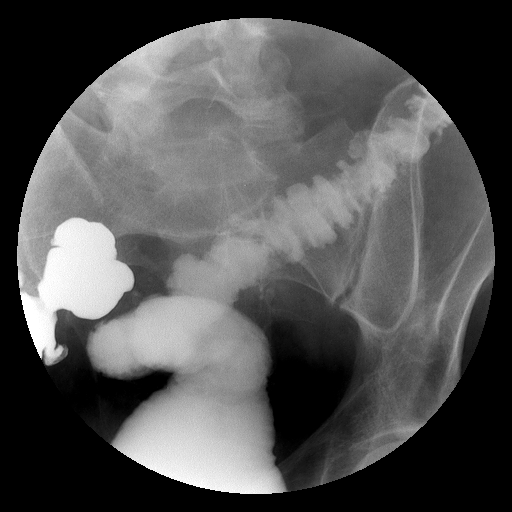

[view not recorded (1 of 4)]
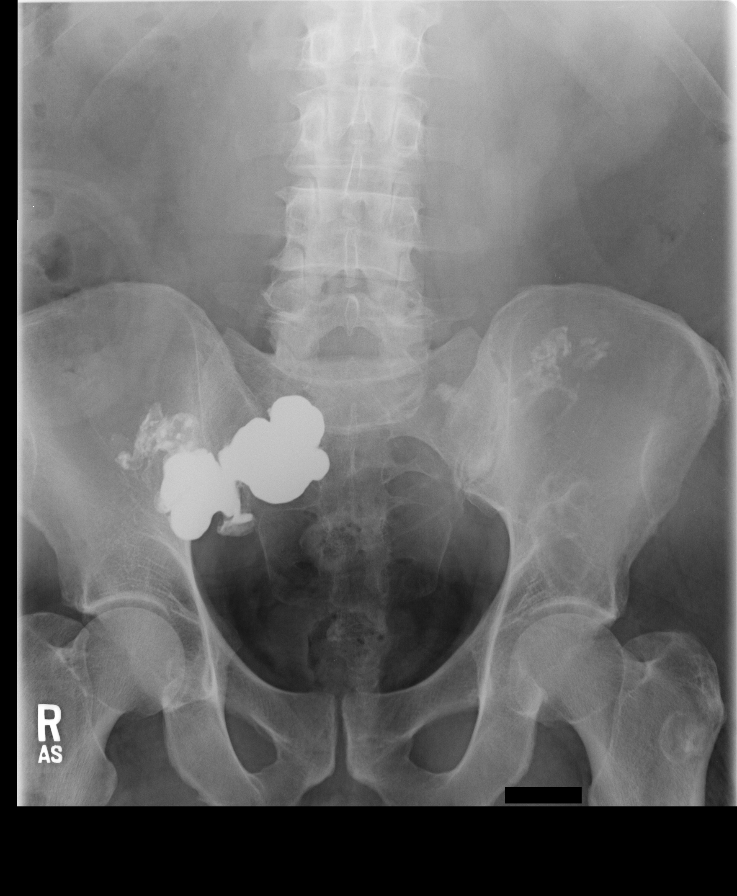

[view not recorded (2 of 4)]
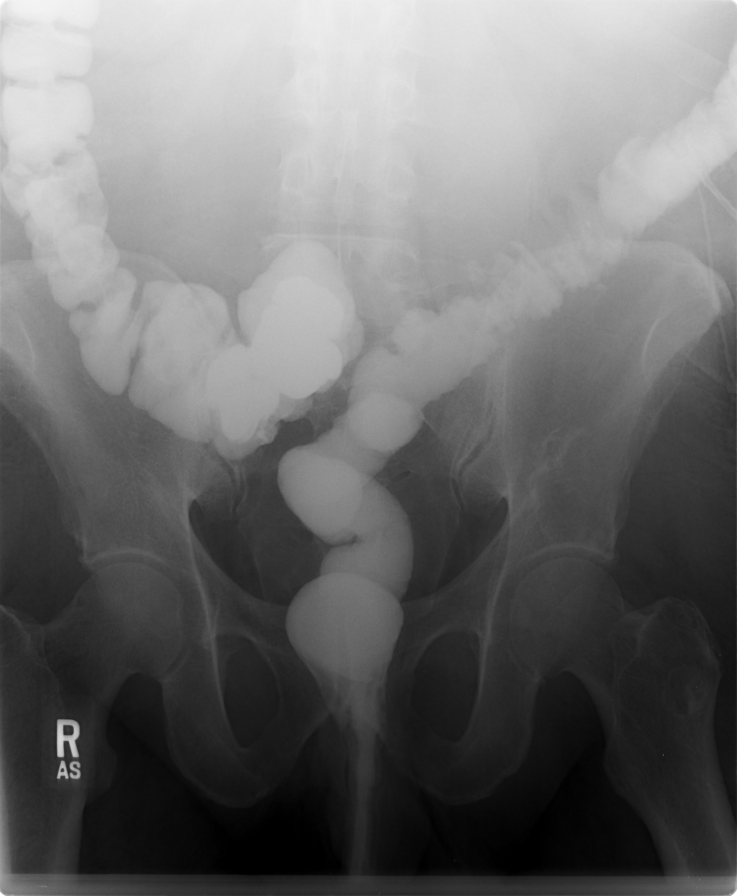

[view not recorded (3 of 4)]
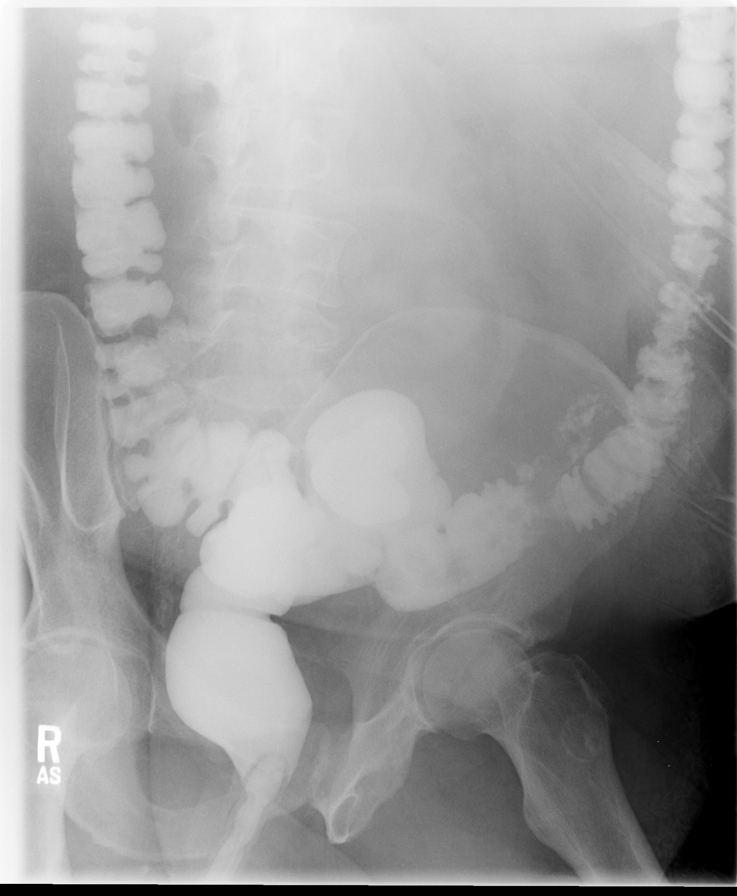

[view not recorded (4 of 4)]
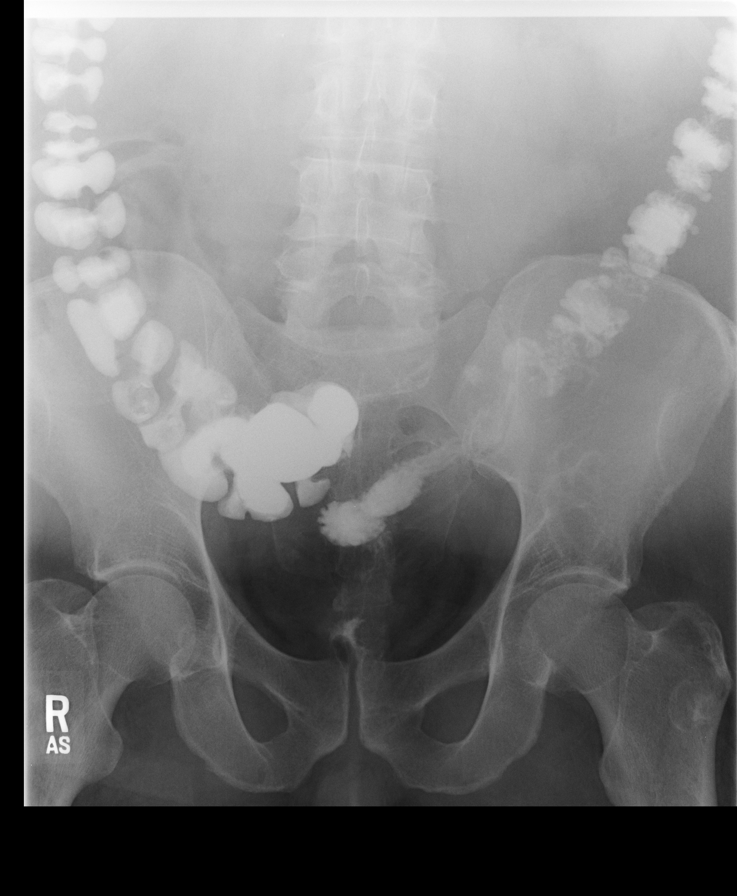

[13 of 24 positions shown; findings below may reference images not displayed]

EXAM:
BE WITH CONTRAST - WITHOUT AND WITH KUB

CONTRAST:  Water-soluble contrast

FLUOROSCOPY TIME:  Radiation Exposure Index (as provided by the
fluoroscopic device): 320 deciGy per square cm

If the device does not provide the exposure index:

Fluoroscopy Time (in minutes and seconds):  2 minutes 12 second

Number of Acquired Images:
FINDINGS: On the preliminary KUB there is some barium in the right lower
quadrant which appears to correspond to the cecum. Also there is a
small amount of linear barium in the left pelvis at the site of the
prior extravasation.

Water-soluble contrast was administered. There is mild spasm at the
site of the primary closure of the sigmoid colon previously, but no
definitive leakage of contrast is seen. Contrast adjacent to the
area of prior extravasation of barium does overlie the area being
evaluated making assessment slightly more difficult. However no
definite point of leakage of water-soluble contrast is seen. The
more proximal colon is unremarkable. On the post evacuation films no
evidence of current leakage of water-soluble contrast is seen. Again
the area of prior leakage of a small amount of barium remains and
does overlie the left lower quadrant.
IMPRESSION: No present evidence of leakage of the water-soluble contrast is
seen. Some residual barium from the prior extravasation does overlie
this region making assessment somewhat more difficult.

## 2017-09-26 ENCOUNTER — Emergency Department (HOSPITAL_COMMUNITY): Payer: BLUE CROSS/BLUE SHIELD

## 2017-09-26 ENCOUNTER — Encounter (HOSPITAL_COMMUNITY): Payer: Self-pay | Admitting: Emergency Medicine

## 2017-09-26 ENCOUNTER — Emergency Department (HOSPITAL_COMMUNITY)
Admission: EM | Admit: 2017-09-26 | Discharge: 2017-09-26 | Disposition: A | Discharge status: Home / Self Care | Payer: BLUE CROSS/BLUE SHIELD | Attending: Emergency Medicine | Admitting: Emergency Medicine

## 2017-09-26 ENCOUNTER — Other Ambulatory Visit: Payer: Self-pay

## 2017-09-26 DIAGNOSIS — Z79899 Other long term (current) drug therapy: Secondary | ICD-10-CM | POA: Insufficient documentation

## 2017-09-26 DIAGNOSIS — R1030 Lower abdominal pain, unspecified: Secondary | ICD-10-CM | POA: Insufficient documentation

## 2017-09-26 DIAGNOSIS — M5136 Other intervertebral disc degeneration, lumbar region: Secondary | ICD-10-CM | POA: Diagnosis not present

## 2017-09-26 DIAGNOSIS — I10 Essential (primary) hypertension: Secondary | ICD-10-CM | POA: Diagnosis not present

## 2017-09-26 DIAGNOSIS — M545 Low back pain: Secondary | ICD-10-CM | POA: Diagnosis present

## 2017-09-26 LAB — URINALYSIS, ROUTINE W REFLEX MICROSCOPIC
BILIRUBIN URINE: NEGATIVE
Bacteria, UA: NONE SEEN
Glucose, UA: NEGATIVE mg/dL
Ketones, ur: 5 mg/dL — AB
LEUKOCYTES UA: NEGATIVE
Nitrite: NEGATIVE
Protein, ur: 30 mg/dL — AB
SPECIFIC GRAVITY, URINE: 1.023 (ref 1.005–1.030)
pH: 7 (ref 5.0–8.0)

## 2017-09-26 MED ORDER — OXYCODONE-ACETAMINOPHEN 5-325 MG PO TABS: 1.0000 | ORAL_TABLET | Freq: Three times a day (TID) | ORAL | 0 refills | Status: DC | PRN

## 2017-09-26 MED ORDER — LIDOCAINE 5 % EX PTCH: 1.0000 | MEDICATED_PATCH | CUTANEOUS | 0 refills | Status: DC

## 2017-09-26 MED ORDER — METHOCARBAMOL 500 MG PO TABS: 500.0000 mg | ORAL_TABLET | Freq: Two times a day (BID) | ORAL | 0 refills | Status: DC

## 2017-09-26 MED ORDER — METHYLPREDNISOLONE 4 MG PO TBPK: ORAL_TABLET | ORAL | 0 refills | Status: DC

## 2017-09-26 MED ORDER — OXYCODONE-ACETAMINOPHEN 5-325 MG PO TABS
1.0000 | ORAL_TABLET | Freq: Once | ORAL | Status: AC
Start: 2017-09-26 — End: 2017-09-26
  Administered 2017-09-26: 1 via ORAL
  Filled 2017-09-26: qty 1

## 2017-09-26 NOTE — ED Notes (Signed)
Patient transported to CT 

## 2017-09-26 NOTE — ED Triage Notes (Addendum)
Back pain seen MD for it and got muscle relaxers. Been going on for several years. Denies incontinence. Numbness in buttocks. Generalized pain. Requesting CT scan or further testing than PCP.

## 2017-09-26 NOTE — ED Notes (Signed)
ED Provider at bedside. 

## 2017-09-26 NOTE — ED Notes (Signed)
Pt ambulatory to restroom to provide urine specimen.

## 2017-09-26 NOTE — ED Provider Notes (Signed)
[facility] EMERGENCY DEPARTMENT Provider Note   CSN: [id number] Arrival date & time: 09/26/17  0932     History   Chief Complaint Chief Complaint  Patient presents with  . Back Pain    HPI Bryan [name] is a 47 y.o. male with a past medical history of hypertension, nephrolithiasis, who presents to ED for evaluation of lower back pain for the past several days.  States that he has had the pain intermittently for the past 10 years.  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  He states that the pain is sharp, worse with palpation and movement.  Describes the pain as 10/10.  States that it feels "worse than a kidney stone."  Denies any dysuria or hematuria.  Denies any prior back surgeries, history of cancer, history of IV drug use, fever, loss of bowel or bladder function, numbness in arms or legs, changes in gait, injuries or falls, abdominal pain.  HPI  Past Medical History:  Diagnosis Date  . History of diverticulitis of colon 07/30/2014   07-30-2014  acute diverticulitis;  08-21-2014 recurrent diverticulitis w/ microperforation, 08-26-2014 s/p partial colectomy (post-op intra-abdominal abscesses and complication wound healing)  . Hypertension   . Right ureteral stone     Patient Active Problem List   Diagnosis Date Noted  . Incisional hernia without obstruction or gangrene 11/02/2015  . Incisional hernia 11/02/2015  . Ileostomy in place [facility]) 12/31/2014  . Diverticulitis 09/02/2014  . Diverticulitis of colon with perforation 08/21/2014  . Acute diverticulitis 07/30/2014  . Traumatic hematoma of thigh 03/19/2012    Past Surgical History:  Procedure Laterality Date  . APPENDECTOMY  1981  . CYSTOSCOPY/URETEROSCOPY/HOLMIUM LASER/STENT PLACEMENT Right 06/08/2017   Procedure: CYSTOSCOPY/RETROGRADE/URETEROSCOPY/HOLMIUM LASER/STENT PLACEMENT;  Surgeon: Rene Paci, MD;  Location: [facility];  Service: Urology;  Laterality: Right;  ONLY NEEDS 45 MIN  . ILEOSTOMY CLOSURE N/A 12/31/2014   Procedure: ILEOSTOMY CLOSURE;  Surgeon: Harriette Bouillon, MD;  Location: [facility] OR;  Service: General;  Laterality: N/A;  . INCISIONAL HERNIA REPAIR N/A 11/02/2015   Procedure: LAPAROSCOPIC INCISIONAL HERNIA;  Surgeon: Harriette Bouillon, MD;  Location: [facility] OR;  Service: General;  Laterality: N/A;  . INSERTION OF MESH N/A 11/02/2015   Procedure: INSERTION OF MESH;  Surgeon: Harriette Bouillon, MD;  Location: [facility] OR;  Service: General;  Laterality: N/A;  . LAPAROSCOPIC PARTIAL COLECTOMY N/A 08/26/2014   Procedure: LAPAROSCOPIC ASSISTED PARTIAL COLECTOMY WITH OSTOMY;  Surgeon: Harriette Bouillon, MD;  Location: [facility] OR;  Service: General;  Laterality: N/A;        Home Medications    Prior to Admission medications   Medication Sig Start Date End Date Taking? Authorizing Provider  acetaminophen (TYLENOL) 325 MG tablet Take 2 tablets (650 mg total) by mouth every 6 (six) hours as needed for mild pain (or temp > 100). 08/31/14   Nonie Hoyer, PA-C  clindamycin (CLEOCIN T) 1 % external solution Apply 1 application topically daily as needed (for acne on scalp when not taking Minocycline).  05/15/17   [provider]  cyclobenzaprine (FLEXERIL) 10 MG tablet Take 10 mg by mouth 3 (three) times daily as needed for muscle spasms. 04/26/17   [provider]  hydrocortisone 2.5 % cream Apply 1 application topically daily as needed (for Eczema on face).  05/02/17   [provider]  lidocaine (LIDODERM) 5 % Place 1 patch onto the skin daily. Remove &  Discard patch within 12 hours or as directed by MD 09/26/17   Dietrich Pates, PA-C  lisinopril (PRINIVIL,ZESTRIL) 40 MG tablet Take 40 mg by mouth every morning.  01/23/17   [provider]  meloxicam (MOBIC) 15 MG tablet Take 15 mg by mouth daily as needed (for tennis elbow).  03/12/17   [provider]  methocarbamol (ROBAXIN) 500 MG  tablet Take 1 tablet (500 mg total) by mouth 2 (two) times daily. 09/26/17   Shandell Giovanni, PA-C  methylPREDNISolone (MEDROL DOSEPAK) 4 MG TBPK tablet Taper over 6 days. 09/26/17   Brigitt Mcclish, PA-C  minocycline (MINOCIN,DYNACIN) 50 MG capsule Take 50 mg by mouth every morning.     [provider]  ondansetron (ZOFRAN ODT) 4 MG disintegrating tablet Take 1 tablet (4 mg total) by mouth every 8 (eight) hours as needed for nausea or vomiting. 05/25/17   Leaphart, Lynann Beaver, PA-C  oxyCODONE-acetaminophen (PERCOCET/ROXICET) 5-325 MG tablet Take 1 tablet by mouth every 8 (eight) hours as needed for severe pain. 09/26/17   Kili Gracy, PA-C  phenazopyridine (PYRIDIUM) 200 MG tablet Take 1 tablet (200 mg total) by mouth 3 (three) times daily as needed (for pain with urination). 06/08/17 06/08/18  Rene Paci, MD  tamsulosin (FLOMAX) 0.4 MG CAPS capsule Take 1 capsule (0.4 mg total) by mouth daily after breakfast. Patient taking differently: Take 0.4 mg by mouth daily after breakfast.  05/25/17   Leaphart, Lynann Beaver, PA-C    Family History Family History  Problem Relation Age of Onset  . Cancer Father        lung    Social History Social History   Tobacco Use  . Smoking status: Never Smoker  . Smokeless tobacco: Never Used  Substance Use Topics  . Alcohol use: No  . Drug use: No     Allergies   Adhesive [tape]   Review of Systems Review of Systems  Constitutional: Negative for appetite change, chills and fever.  HENT: Negative for ear pain, rhinorrhea, sneezing and sore throat.   Eyes: Negative for photophobia and visual disturbance.  Respiratory: Negative for cough, chest tightness, shortness of breath and wheezing.   Cardiovascular: Negative for chest pain and palpitations.  Gastrointestinal: Negative for abdominal pain, blood in stool, constipation, diarrhea, nausea and vomiting.  Genitourinary: Negative for dysuria, hematuria and urgency.  Musculoskeletal:  Positive for back pain and myalgias.  Skin: Negative for rash.  Neurological: Negative for dizziness, weakness and light-headedness.     Physical Exam Updated Vital Signs BP (!) 143/100 (BP Location: Right Arm)   Pulse 89   Temp 98.2 F (36.8 C) (Oral)   Resp 18   SpO2 97%   Physical Exam  Constitutional: He appears well-developed and well-nourished. No distress.  HENT:  Head: Normocephalic and atraumatic.  Nose: Nose normal.  Eyes: Conjunctivae and EOM are normal. Left eye exhibits no discharge. No scleral icterus.  Neck: Normal range of motion. Neck supple.  Cardiovascular: Normal rate, regular rhythm, normal heart sounds and intact distal pulses. Exam reveals no gallop and no friction rub.  No murmur heard. Pulmonary/Chest: Effort normal and breath sounds normal. No respiratory distress.  Abdominal: Soft. Bowel sounds are normal. He exhibits no distension. There is no tenderness. There is no guarding.  Musculoskeletal: Normal range of motion. He exhibits no edema.       Back:  Tenderness to palpation of the lumbar spine at the midline paraspinal musculature as noted in the image.  No midline spinal tenderness  present in thoracic or cervical spine. No step-off palpated. No visible bruising, edema or temperature change noted. No objective signs of numbness present. No saddle anesthesia. 2+ DP pulses bilaterally. Sensation intact to light touch. Strength 5/5 in bilateral lower extremities.  Neurological: He is alert. He exhibits normal muscle tone. Coordination normal.  Reflex Scores:      Patellar reflexes are 2+ on the right side and 2+ on the left side. Skin: Skin is warm and dry. No rash noted.  Psychiatric: He has a normal mood and affect.  Nursing note and vitals reviewed.    ED Treatments / Results  Labs (all labs ordered are listed, but only abnormal results are displayed) Labs Reviewed  URINALYSIS, ROUTINE W REFLEX MICROSCOPIC - Abnormal; Notable for the following  components:      Result Value   Hgb urine dipstick MODERATE (*)    Ketones, ur 5 (*)    Protein, ur 30 (*)    All other components within normal limits    EKG None  Radiology Ct Renal Stone Study  Result Date: 09/26/2017 CLINICAL DATA:  Chronic back pain and flank pain. Current history of urinary tract calculi. Surgical history includes appendectomy in 1981, partial colectomy with ileostomy and subsequent ileostomy closure in 2016, incisional hernia repair with mesh in 2017, and RIGHT ureteral stone extraction in May, 2019. EXAM: CT ABDOMEN AND PELVIS WITHOUT CONTRAST TECHNIQUE: Multidetector CT imaging of the abdomen and pelvis was performed following the standard protocol without IV contrast. COMPARISON:  06/06/2017, 05/25/2017, 07/16/2015 and earlier. FINDINGS: Lower chest: Respiratory motion blurred images of the lung bases. Heart size normal. Visualized lung bases clear. Hepatobiliary: Normal unenhanced appearance of the liver. Gallbladder normal in appearance without calcified gallstones. No biliary ductal dilation. Pancreas: Normal unenhanced appearance. Spleen: Normal unenhanced appearance. Adrenals/Urinary Tract: Normal appearing adrenal glands. Non-obstructing small (2 mm) calculus in a posterior mid calyx of the RIGHT kidney. No evidence of LEFT renal calculi. No ureteral calculi on either side with interval removal of the previously identified RIGHT ureteral calculi. Within the limits of the unenhanced technique, no focal parenchymal abnormality involving either kidney. No hydronephrosis. Normal appearing decompressed urinary bladder. Stomach/Bowel: Stomach normal in appearance for the degree of distention. Ileoileal anastomosis in RIGHT UPPER pelvis without complicating features apart from the fact that the bowel may be tethered to the ANTERIOR abdominal wall related to scar in this location. Normal-appearing small bowel throughout. Colocolic anastomosis in the sigmoid region without  complicating features. Scattered sigmoid colon diverticula without evidence of acute diverticulosis. Remainder of the colon normal in appearance with expected stool burden. Surgically absent appendix. Vascular/Lymphatic: Mild aortoiliac atherosclerosis without evidence of aneurysm. No pathologic lymphadenopathy. Reproductive: Prostate gland and seminal vesicles normal in size and appearance for age. Other: Prior incisional hernia repair in the RIGHT periumbilical location with no evidence of recurrence. Musculoskeletal: Multilevel degenerative disc disease and spondylosis throughout the lumbar spine with evidence of a desiccated disc extrusion at L4-5 and severe multifactorial spinal stenosis at this level. 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 IMPRESSION: 1. No acute abnormalities involving the abdomen or pelvis. 2. Desiccated disc extrusion at L4-5 with severe multifactorial spinal stenosis at this level. 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 3. Non-obstructing 2 mm calculus in a mid calyx of the RIGHT kidney. No obstructing ureteral calculi on either side. No LEFT urinary tract calculi. 4. Scattered sigmoid colon  diverticula without evidence of acute diverticulitis. 5.  Aortic Atherosclerosis, mild.  (ICD10-170.0) Electronically Signed   By: Hulan Saas M.D.   On: 09/26/2017 11:51    Procedures Procedures (including critical care time)  Medications Ordered in ED Medications  oxyCODONE-acetaminophen (PERCOCET/ROXICET) 5-325 MG per tablet 1 tablet (1 tablet Oral Given 09/26/17 1039)     Initial Impression / Assessment and Plan / ED Course  I have reviewed the triage vital signs and the nursing notes.  Pertinent labs & imaging results that were available during my care of the patient were reviewed by me and considered in my medical decision making  (see chart for details).     Patient denies any concerning symptoms suggestive of cauda equina requiring urgent imaging at this time such as loss of sensation in the lower extremities, lower extremity weakness, loss of bowel or bladder control, saddle anesthesia, urinary retention, fever/chills, IVDU. Exam demonstrated no  weakness on exam today. No preceding injury or trauma to suggest acute fracture. Doubt pelvic or urinary pathology for patient's acute back pain, as patient denies urinary symptoms, has no evidence of infection on UA, has no CVA tenderness, history/pain not consistent with nephrolithiasis. Doubt AAA as cause of patient's back pain as patient lacksmajor risk factors, had no abdominal TTP, and has symmetric and intact distal pulses. CT renal stone study shows degenerative disc disease spinal stenosis present.  No evidence of obstructive stone.  Patient given strict return precautions for any symptoms indicating worsening neurologic function in the lower extremities, and will refer to neurosurgery.  Portions of this note were generated with Scientist, clinical (histocompatibility and immunogenetics). Dictation errors may occur despite best attempts at proofreading.  Final Clinical Impressions(s) / ED Diagnoses   Final diagnoses:  Degenerative disc disease, lumbar    ED Discharge Orders         Ordered    methylPREDNISolone (MEDROL DOSEPAK) 4 MG TBPK tablet     09/26/17 1216    methocarbamol (ROBAXIN) 500 MG tablet  2 times daily     09/26/17 1216    oxyCODONE-acetaminophen (PERCOCET/ROXICET) 5-325 MG tablet  Every 8 hours PRN     09/26/17 1216    lidocaine (LIDODERM) 5 %  Every 24 hours     09/26/17 1216           Dietrich Pates, PA-C 09/26/17 1219    Virgina Norfolk, DO 09/26/17 2125

## 2017-09-26 NOTE — Discharge Instructions (Addendum)
Follow-up with the neurosurgeon listed below for further evaluation. Return to ED for worsening symptoms, severe back pain, numbness in your groin area or numbness down your legs, losing control of your bowels or bladder.

## 2017-10-08 ENCOUNTER — Other Ambulatory Visit: Payer: Self-pay | Admitting: Dermatology

## 2017-10-22 ENCOUNTER — Emergency Department (HOSPITAL_COMMUNITY): Payer: BLUE CROSS/BLUE SHIELD

## 2017-10-22 ENCOUNTER — Other Ambulatory Visit: Payer: Self-pay

## 2017-10-22 ENCOUNTER — Encounter (HOSPITAL_COMMUNITY): Payer: Self-pay

## 2017-10-22 ENCOUNTER — Observation Stay (HOSPITAL_COMMUNITY)
Admission: EM | Admit: 2017-10-22 | Discharge: 2017-10-23 | Disposition: A | Discharge status: Home / Self Care | Payer: BLUE CROSS/BLUE SHIELD | Attending: Oncology | Admitting: Oncology

## 2017-10-22 DIAGNOSIS — Z6839 Body mass index (BMI) 39.0-39.9, adult: Secondary | ICD-10-CM | POA: Insufficient documentation

## 2017-10-22 DIAGNOSIS — Z833 Family history of diabetes mellitus: Secondary | ICD-10-CM | POA: Diagnosis not present

## 2017-10-22 DIAGNOSIS — T428X1A Poisoning by antiparkinsonism drugs and other central muscle-tone depressants, accidental (unintentional), initial encounter: Principal | ICD-10-CM | POA: Insufficient documentation

## 2017-10-22 DIAGNOSIS — Z79899 Other long term (current) drug therapy: Secondary | ICD-10-CM

## 2017-10-22 DIAGNOSIS — F22 Delusional disorders: Secondary | ICD-10-CM

## 2017-10-22 DIAGNOSIS — R4182 Altered mental status, unspecified: Secondary | ICD-10-CM | POA: Diagnosis not present

## 2017-10-22 DIAGNOSIS — R441 Visual hallucinations: Secondary | ICD-10-CM

## 2017-10-22 DIAGNOSIS — Z87442 Personal history of urinary calculi: Secondary | ICD-10-CM | POA: Diagnosis not present

## 2017-10-22 DIAGNOSIS — M545 Low back pain: Secondary | ICD-10-CM

## 2017-10-22 DIAGNOSIS — Z888 Allergy status to other drugs, medicaments and biological substances status: Secondary | ICD-10-CM | POA: Insufficient documentation

## 2017-10-22 DIAGNOSIS — R4781 Slurred speech: Secondary | ICD-10-CM | POA: Diagnosis not present

## 2017-10-22 DIAGNOSIS — Z79891 Long term (current) use of opiate analgesic: Secondary | ICD-10-CM

## 2017-10-22 DIAGNOSIS — Z9049 Acquired absence of other specified parts of digestive tract: Secondary | ICD-10-CM | POA: Diagnosis not present

## 2017-10-22 DIAGNOSIS — Z8249 Family history of ischemic heart disease and other diseases of the circulatory system: Secondary | ICD-10-CM

## 2017-10-22 DIAGNOSIS — I10 Essential (primary) hypertension: Secondary | ICD-10-CM

## 2017-10-22 DIAGNOSIS — Z91048 Other nonmedicinal substance allergy status: Secondary | ICD-10-CM

## 2017-10-22 DIAGNOSIS — Y92009 Unspecified place in unspecified non-institutional (private) residence as the place of occurrence of the external cause: Secondary | ICD-10-CM | POA: Insufficient documentation

## 2017-10-22 DIAGNOSIS — T50901A Poisoning by unspecified drugs, medicaments and biological substances, accidental (unintentional), initial encounter: Secondary | ICD-10-CM | POA: Diagnosis present

## 2017-10-22 DIAGNOSIS — E663 Overweight: Secondary | ICD-10-CM | POA: Diagnosis not present

## 2017-10-22 DIAGNOSIS — G8929 Other chronic pain: Secondary | ICD-10-CM | POA: Diagnosis not present

## 2017-10-22 LAB — URINALYSIS, COMPLETE (UACMP) WITH MICROSCOPIC
BACTERIA UA: NONE SEEN
BILIRUBIN URINE: NEGATIVE
Glucose, UA: NEGATIVE mg/dL
Ketones, ur: NEGATIVE mg/dL
LEUKOCYTES UA: NEGATIVE
NITRITE: NEGATIVE
PH: 5 (ref 5.0–8.0)
Protein, ur: NEGATIVE mg/dL
SPECIFIC GRAVITY, URINE: 1.021 (ref 1.005–1.030)

## 2017-10-22 LAB — CBC
HCT: 35.9 % — ABNORMAL LOW (ref 39.0–52.0)
Hemoglobin: 13 g/dL (ref 13.0–17.0)
MCH: 33.9 pg (ref 26.0–34.0)
MCHC: 36.2 g/dL — ABNORMAL HIGH (ref 30.0–36.0)
MCV: 93.7 fL (ref 78.0–100.0)
PLATELETS: 199 10*3/uL (ref 150–400)
RBC: 3.83 MIL/uL — ABNORMAL LOW (ref 4.22–5.81)
RDW: 15.9 % — ABNORMAL HIGH (ref 11.5–15.5)
WBC: 7.4 10*3/uL (ref 4.0–10.5)

## 2017-10-22 LAB — COMPREHENSIVE METABOLIC PANEL
ALT: 23 U/L (ref 0–44)
ANION GAP: 7 (ref 5–15)
AST: 42 U/L — ABNORMAL HIGH (ref 15–41)
Albumin: 3.8 g/dL (ref 3.5–5.0)
Alkaline Phosphatase: 58 U/L (ref 38–126)
BUN: 35 mg/dL — ABNORMAL HIGH (ref 6–20)
CHLORIDE: 109 mmol/L (ref 98–111)
CO2: 25 mmol/L (ref 22–32)
Calcium: 9 mg/dL (ref 8.9–10.3)
Creatinine, Ser: 1.63 mg/dL — ABNORMAL HIGH (ref 0.61–1.24)
GFR calc Af Amer: 56 mL/min — ABNORMAL LOW (ref >60)
GFR, EST NON AFRICAN AMERICAN: 48 mL/min — AB (ref >60)
Glucose, Bld: 101 mg/dL — ABNORMAL HIGH (ref 70–99)
Potassium: 4.5 mmol/L (ref 3.5–5.1)
SODIUM: 141 mmol/L (ref 135–145)
Total Bilirubin: 1.1 mg/dL (ref 0.3–1.2)
Total Protein: 6.6 g/dL (ref 6.5–8.1)

## 2017-10-22 LAB — CBC WITH DIFFERENTIAL/PLATELET
ABS IMMATURE GRANULOCYTES: 0 10*3/uL (ref 0.0–0.1)
Basophils Absolute: 0.1 10*3/uL (ref 0.0–0.1)
Basophils Relative: 1 %
EOS ABS: 0.4 10*3/uL (ref 0.0–0.7)
Eosinophils Relative: 5 %
HEMATOCRIT: 39.8 % (ref 39.0–52.0)
Hemoglobin: 13.3 g/dL (ref 13.0–17.0)
IMMATURE GRANULOCYTES: 0 %
LYMPHS ABS: 1.6 10*3/uL (ref 0.7–4.0)
Lymphocytes Relative: 19 %
MCH: 30.8 pg (ref 26.0–34.0)
MCHC: 33.4 g/dL (ref 30.0–36.0)
MCV: 92.1 fL (ref 78.0–100.0)
Monocytes Absolute: 0.7 10*3/uL (ref 0.1–1.0)
Monocytes Relative: 8 %
NEUTROS ABS: 5.9 10*3/uL (ref 1.7–7.7)
NEUTROS PCT: 67 %
Platelets: 196 10*3/uL (ref 150–400)
RBC: 4.32 MIL/uL (ref 4.22–5.81)
RDW: 15.3 % (ref 11.5–15.5)
WBC: 8.8 10*3/uL (ref 4.0–10.5)

## 2017-10-22 LAB — RAPID URINE DRUG SCREEN, HOSP PERFORMED
Amphetamines: NOT DETECTED
BARBITURATES: NOT DETECTED
Benzodiazepines: POSITIVE — AB
Cocaine: NOT DETECTED
OPIATES: POSITIVE — AB
TETRAHYDROCANNABINOL: NOT DETECTED

## 2017-10-22 LAB — CREATININE, SERUM
CREATININE: 1.43 mg/dL — AB (ref 0.61–1.24)
GFR, EST NON AFRICAN AMERICAN: 56 mL/min — AB (ref >60)

## 2017-10-22 LAB — CBG MONITORING, ED: GLUCOSE-CAPILLARY: 80 mg/dL (ref 70–99)

## 2017-10-22 LAB — ETHANOL: Alcohol, Ethyl (B): <10 mg/dL (ref <10)

## 2017-10-22 MED ORDER — POLYETHYLENE GLYCOL 3350 17 G PO PACK: 17.0000 g | PACK | Freq: Every day | ORAL | Status: DC | PRN

## 2017-10-22 MED ORDER — LIDOCAINE 5 % EX PTCH
1.0000 | MEDICATED_PATCH | CUTANEOUS | Status: DC
  Administered 2017-10-22 – 2017-10-23 (×2): 1 via TRANSDERMAL
  Filled 2017-10-22 (×2): qty 1

## 2017-10-22 MED ORDER — ACETAMINOPHEN 650 MG RE SUPP: 650.0000 mg | Freq: Four times a day (QID) | RECTAL | Status: DC | PRN

## 2017-10-22 MED ORDER — LISINOPRIL 40 MG PO TABS
40.0000 mg | ORAL_TABLET | Freq: Every day | ORAL | Status: DC
  Administered 2017-10-22: 40 mg via ORAL
  Filled 2017-10-22: qty 1

## 2017-10-22 MED ORDER — ACETAMINOPHEN 325 MG PO TABS: 650.0000 mg | ORAL_TABLET | Freq: Four times a day (QID) | ORAL | Status: DC | PRN

## 2017-10-22 MED ORDER — SODIUM CHLORIDE 0.9 % IV BOLUS
1000.0000 mL | Freq: Once | INTRAVENOUS | Status: AC
  Administered 2017-10-22: 1000 mL via INTRAVENOUS

## 2017-10-22 MED ORDER — SODIUM CHLORIDE 0.9 % IV SOLN
INTRAVENOUS | Status: DC
  Administered 2017-10-22 – 2017-10-23 (×3): via INTRAVENOUS

## 2017-10-22 MED ORDER — KETOROLAC TROMETHAMINE 30 MG/ML IJ SOLN
30.0000 mg | Freq: Four times a day (QID) | INTRAMUSCULAR | Status: DC | PRN
  Administered 2017-10-22 (×2): 30 mg via INTRAVENOUS
  Filled 2017-10-22 (×2): qty 1

## 2017-10-22 MED ORDER — ENOXAPARIN SODIUM 40 MG/0.4ML ~~LOC~~ SOLN
40.0000 mg | SUBCUTANEOUS | Status: DC
  Filled 2017-10-22: qty 0.4

## 2017-10-22 NOTE — Progress Notes (Signed)
Spoke with Angelique Blonder from [employer], gave an update on patient.

## 2017-10-22 NOTE — ED Notes (Signed)
Pt. Requesting his wallet. Belongings returned to pt. Wallet not with belongings. Pt. States he thinks he had it with him and said he might have lost it in CT. This nurse went to CT and there was no wallet.

## 2017-10-22 NOTE — ED Notes (Addendum)
Pt. Belongings inventoried. Pt had a shirt, shorts, and a cell phone. Belongings placed in locker 2

## 2017-10-22 NOTE — ED Notes (Signed)
Tatiana-SR (HH breakfast Tray Ordered) called @ 0823-per Whitney, RN-called by Marylene Land

## 2017-10-22 NOTE — ED Triage Notes (Addendum)
Pt coming from home with reports of hallucinations. Pt. Called PD with reports of someone moving his car. When police got there, the car had not been moved. Pt. Also reported seeing people with guns on his porch. Pt. Was then seen by PD on his porch alone with a loaded gun. A/O X2. Pt. Just says he is having a little back pain. Per EMS a bottle of zanaflax  filled on 9/19 is empty.   CBG 101

## 2017-10-22 NOTE — H&P (Signed)
Date: 10/22/2017               Patient Name:  Bryan [name] MRN: [id number]  DOB: November 13, 1970 Age / Sex: 47 y.o., male   PCP: Barbie Banner, MD         Medical Service: Internal Medicine Teaching Service         Attending Physician: Dr. Levert Feinstein, MD    First Contact: Dr. Avie Arenas Pager: [phone]  Second Contact: Dr. Mikey Bussing Pager: [phone]       After Hours (After 5p/  First Contact Pager: [phone]  weekends / holidays): Second Contact Pager: [phone]   Chief Complaint: Altered mental status with hallucinations.  History of Present Illness: Bryan [name] is a 47 y.o.  gentleman with PMHx significant for hypertension, nephrolithiasis and chronic back pain brought to ED via EMS with altered mental status and hallucinations.  Patient called police last night stating that someone is moving his car, when police arrived patient was in his porch with gone stating that there are multiple people's with guarding trying to kill him.  He was quite disoriented so please call the EMS who brought the patient to ED for further evaluation.  Patient had an history of chronic back pain and multiple muscle relaxants include Flexeril and Robaxin was found in his back, there was an empty bottle of Zanaflex which was filled on 10/04/17 for 60 tablets.  During interview patient was having little slurred speech otherwise alert and oriented x3, according to him he was taking 3 to 4 pills of tizanidine together daily for the past 2 to 3 days because of his worsening lower back pain.  Occasionally he combines it with Flexeril, unable to explain whether he did took it yesterday or not.  He was complaining of 10 out of 10 lower back pain, non radiating, denies any tingling numbness or focal weakness, recently had his MRI done at neurology office, had an appointment tomorrow at 2 PM to discuss results. Patient denies any suicidal thoughts. Patient was denying any ongoing hallucinations at  the time of interview. Sister was present at bedside, according to her he is still not back to his baseline. They have noticed some behavioral changes and delusions over the past couple of days. Patient denies any headaches, blurry vision, recent illnesses, chest pain, palpitations or exertional dyspnea. He denies any change in his appetite or bowel habits. He denies any urinary symptoms, and history of nephrolithiasis with lithotripsy done in May 2019.  ED course.  On initial presentation patient was little combative, AXO x2, vitals stable, labs within normal limit, UDS positive for opioids and benzos, CT head normal, appears disoriented most likely secondary to drug overdose.  Poison control was called who advised just monitoring, most common side effect is oversedation and hallucinations.  Meds:  Current Meds  Medication Sig  . cyclobenzaprine (FLEXERIL) 10 MG tablet Take 10 mg by mouth 3 (three) times daily as needed for muscle spasms.  Marland Kitchen gabapentin (NEURONTIN) 100 MG capsule Take 100 mg by mouth 3 (three) times daily as needed (for burning pain).  Marland Kitchen lisinopril (PRINIVIL,ZESTRIL) 40 MG tablet Take 40 mg by mouth daily.   . meloxicam (MOBIC) 15 MG tablet Take 15 mg by mouth daily as needed (for tennis elbow).   . minocycline (MINOCIN,DYNACIN) 50 MG capsule Take 50 mg by mouth 2 (two) times daily.   . nabumetone (RELAFEN) 750 MG tablet Take 750 mg by mouth 2 (two) times daily.  Marland Kitchen  tiZANidine (ZANAFLEX) 4 MG capsule Take 4 mg by mouth 2 (two) times daily.     Allergies: Allergies as of 10/22/2017 - Review Complete 10/22/2017  Allergen Reaction Noted  . Adhesive [tape] Hives 11/02/2015   Past Medical History:  Diagnosis Date  . History of diverticulitis of colon 07/30/2014   07-30-2014  acute diverticulitis;  08-21-2014 recurrent diverticulitis w/ microperforation, 08-26-2014 s/p partial colectomy (post-op intra-abdominal abscesses and complication wound healing)  . Hypertension   .  Right ureteral stone     Family History: Family members with hypertension and diabetes.  Father died of lung cancer.  Social History: Denies smoking and illicit drug use.  Drinks alcohol occasionally.  Review of Systems: A complete ROS was negative except as per HPI.   Physical Exam: Blood pressure (!) 144/104, pulse 87, temperature 98.3 F (36.8 C), resp. rate 18, height 5\' 10"  (1.778 m), weight 113.4 kg, SpO2 99 %. Vitals:   10/22/17 0256 10/22/17 0258 10/22/17 0813  BP: (!) 124/54  (!) 144/104  Pulse: 96  87  Resp: 18    Temp: 98.3 F (36.8 C)  98.3 F (36.8 C)  TempSrc: Oral    SpO2: 95%  99%  Weight:  113.4 kg   Height:  5\' 10"  (1.778 m)    General: Vital signs reviewed.  Patient is well-developed and well-nourished, in no acute distress and cooperative with exam.  Head: Normocephalic and atraumatic. Eyes: EOMI, conjunctivae normal, no scleral icterus.  Neck: Supple, trachea midline, normal ROM, no JVD, masses, thyromegaly, or carotid bruit present.  Cardiovascular: RRR, S1 normal, S2 normal, no murmurs, gallops, or rubs. Pulmonary/Chest: Clear to auscultation bilaterally, no wheezes, rales, or rhonchi. Abdominal: Soft, non-tender, non-distended, BS +. Extremities: No lower extremity edema bilaterally,  pulses symmetric and intact bilaterally. No cyanosis or clubbing. Neurological: A&O x3, slurred speech, strength is normal and symmetric bilaterally, cranial nerve II-XII are grossly intact, no focal motor deficit, sensory intact to light touch bilaterally.  Skin: Warm, dry and intact. No rashes or erythema. Psychiatric: Normal mood and affect. speech and behavior is normal. Cognition and memory are normal.  EKG: personally reviewed my interpretation is normal sinus rhythm.  Assessment & Plan by Problem: Bryan [name] is a 47 y.o.  gentleman with PMHx significant for hypertension, nephrolithiasis and chronic back pain brought to ED via EMS with altered mental  status and hallucinations.  Drug overdose.  Most likely tizanidine along with Flexeril. Tizanidine overdose can cause oversedation, hallucinations and hypertension. Can also cause hepatic dysfunction-current CMP is within normal limit. -Repeat CMP in the morning. -Observation.  Hypertension.  Initially normotensive, now hypertensive. -Continue home dose of lisinopril 40 mg daily.  Lower back pain.  Patient has a chronic lower back pain.  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 Sees a neurologist, recently had an MRI done and had an appointment tomorrow at 2 PM to discuss results. We will avoid any opioids or muscle relaxant at this time. -Toradol 30 mg every 6 hourly as needed. -Lidocaine patch.  CODE STATUS.  Full. DVT prophylaxis.  Lovenox Diet.  Heart healthy  Dispo: Admit patient to Observation with expected length of stay less than 2 midnights.  SignedArnetha Courser, MD 10/22/2017, 9:55 AM  Pager: [phone]

## 2017-10-22 NOTE — ED Notes (Addendum)
Posion control called. Was told to observe pt. For 4 to 6 hours. Symptoms of overdose for zanaflax is drowsiness and hypotension. Will continue to monitor.

## 2017-10-22 NOTE — ED Provider Notes (Signed)
[facility] EMERGENCY DEPARTMENT Provider Note  CSN: [id number] Arrival date & time: 10/22/17 0251  Chief Complaint(s) No chief complaint on file. Triage Note 0255 Pt coming from home with reports of hallucinations. Pt. Called PD with reports of someone moving his car. When police got there, the car had not been moved. Pt. Also reported seeing people with guns on his porch. Pt. Was then seen by PD on his porch alone with a loaded gun. A/O X4. Pt. Just says he is having a little back pain. Per EMS a bottle of muscle relaxant filled on 9/19 is empty.   CBG 101    HPI Bryan [name] is a 47 y.o. male   HPI  Remainder of history, ROS, and physical exam limited due to patient's condition (AMS). Additional information was obtained from EMS.   Level V Caveat.   Past Medical History Past Medical History:  Diagnosis Date  . History of diverticulitis of colon 07/30/2014   07-30-2014  acute diverticulitis;  08-21-2014 recurrent diverticulitis w/ microperforation, 08-26-2014 s/p partial colectomy (post-op intra-abdominal abscesses and complication wound healing)  . Hypertension   . Right ureteral stone    Patient Active Problem List   Diagnosis Date Noted  . Incisional hernia without obstruction or gangrene 11/02/2015  . Incisional hernia 11/02/2015  . Ileostomy in place [facility]) 12/31/2014  . Diverticulitis 09/02/2014  . Diverticulitis of colon with perforation 08/21/2014  . Acute diverticulitis 07/30/2014  . Traumatic hematoma of thigh 03/19/2012   Home Medication(s) Prior to Admission medications   Medication Sig Start Date End Date Taking? Authorizing Provider  cyclobenzaprine (FLEXERIL) 10 MG tablet Take 10 mg by mouth 3 (three) times daily as needed for muscle spasms. 04/26/17  Yes [provider]  gabapentin (NEURONTIN) 100 MG capsule Take 100 mg by mouth 3 (three) times daily as needed (for burning pain).   Yes [provider]    lisinopril (PRINIVIL,ZESTRIL) 40 MG tablet Take 40 mg by mouth daily.  01/23/17  Yes [provider]  meloxicam (MOBIC) 15 MG tablet Take 15 mg by mouth daily as needed (for tennis elbow).  03/12/17  Yes [provider]  minocycline (MINOCIN,DYNACIN) 50 MG capsule Take 50 mg by mouth 2 (two) times daily.    Yes [provider]  nabumetone (RELAFEN) 750 MG tablet Take 750 mg by mouth 2 (two) times daily.   Yes [provider]  tiZANidine (ZANAFLEX) 4 MG capsule Take 4 mg by mouth 2 (two) times daily. 10/04/17  Yes [provider]  acetaminophen (TYLENOL) 325 MG tablet Take 2 tablets (650 mg total) by mouth every 6 (six) hours as needed for mild pain (or temp > 100). Patient not taking: Reported on 10/22/2017 08/31/14   Nonie Hoyer, PA-C  lidocaine (LIDODERM) 5 % Place 1 patch onto the skin daily. Remove & Discard patch within 12 hours or as directed by MD Patient not taking: Reported on 10/22/2017 09/26/17   Dietrich Pates, PA-C  methocarbamol (ROBAXIN) 500 MG tablet Take 1 tablet (500 mg total) by mouth 2 (two) times daily. Patient not taking: Reported on 10/22/2017 09/26/17   Dietrich Pates, PA-C  methylPREDNISolone (MEDROL DOSEPAK) 4 MG TBPK tablet Taper over 6 days. Patient not taking: Reported on 10/22/2017 09/26/17   Dietrich Pates, PA-C  ondansetron (ZOFRAN ODT) 4 MG disintegrating tablet Take 1 tablet (4 mg total) by mouth every 8 (eight) hours as needed for nausea or vomiting. Patient not taking: Reported on 10/22/2017 05/25/17  Rise Mu, PA-C  oxyCODONE-acetaminophen (PERCOCET/ROXICET) 5-325 MG tablet Take 1 tablet by mouth every 8 (eight) hours as needed for severe pain. Patient not taking: Reported on 10/22/2017 09/26/17   Dietrich Pates, PA-C  phenazopyridine (PYRIDIUM) 200 MG tablet Take 1 tablet (200 mg total) by mouth 3 (three) times daily as needed (for pain with urination). Patient not taking: Reported on 10/22/2017 06/08/17 06/08/18  Rene Paci, MD  tamsulosin [facility]) 0.4 MG CAPS capsule Take 1 capsule (0.4 mg total) by mouth daily after breakfast. Patient not taking: Reported on 10/22/2017 05/25/17   Wallace Keller                                                                                                                                    Past Surgical History Past Surgical History:  Procedure Laterality Date  . APPENDECTOMY  1981  . CYSTOSCOPY/URETEROSCOPY/HOLMIUM LASER/STENT PLACEMENT Right 06/08/2017   Procedure: CYSTOSCOPY/RETROGRADE/URETEROSCOPY/HOLMIUM LASER/STENT PLACEMENT;  Surgeon: Rene Paci, MD;  Location: [facility];  Service: Urology;  Laterality: Right;  ONLY NEEDS 45 MIN  . ILEOSTOMY CLOSURE N/A 12/31/2014   Procedure: ILEOSTOMY CLOSURE;  Surgeon: Harriette Bouillon, MD;  Location: [facility] OR;  Service: General;  Laterality: N/A;  . INCISIONAL HERNIA REPAIR N/A 11/02/2015   Procedure: LAPAROSCOPIC INCISIONAL HERNIA;  Surgeon: Harriette Bouillon, MD;  Location: [facility] OR;  Service: General;  Laterality: N/A;  . INSERTION OF MESH N/A 11/02/2015   Procedure: INSERTION OF MESH;  Surgeon: Harriette Bouillon, MD;  Location: [facility] OR;  Service: General;  Laterality: N/A;  . LAPAROSCOPIC PARTIAL COLECTOMY N/A 08/26/2014   Procedure: LAPAROSCOPIC ASSISTED PARTIAL COLECTOMY WITH OSTOMY;  Surgeon: Harriette Bouillon, MD;  Location: [facility] OR;  Service: General;  Laterality: N/A;   Family History Family History  Problem Relation Age of Onset  . Cancer Father        lung    Social History Social History   Tobacco Use  . Smoking status: Never Smoker  . Smokeless tobacco: Never Used  Substance Use Topics  . Alcohol use: No  . Drug use: No   Allergies Adhesive [tape]  Review of Systems Review of Systems  Unable to perform ROS: Mental status change    Physical Exam Vital Signs  I have reviewed the triage vital signs BP (!) 124/54 (BP Location: Right Arm)   Pulse 96   Temp 98.3 F  (36.8 C) (Oral)   Resp 18   Ht 5\' 10"  (1.778 m)   Wt 113.4 kg   SpO2 95%   BMI 35.87 kg/m   Physical Exam  Constitutional: He appears well-developed and well-nourished. No distress.  HENT:  Head: Normocephalic and atraumatic.  Nose: Nose normal.  Eyes: Pupils are equal, round, and reactive to light. Conjunctivae and EOM are normal. Right eye exhibits no discharge. Left eye exhibits no discharge. No scleral icterus.  Neck: Normal range of motion. Neck  supple.  Cardiovascular: Normal rate and regular rhythm. Exam reveals no gallop and no friction rub.  No murmur heard. Pulmonary/Chest: Effort normal and breath sounds normal. No stridor. No respiratory distress. He has no rales.  Abdominal: Soft. He exhibits no distension. There is no tenderness.  Musculoskeletal: He exhibits no edema or tenderness.  Neurological: He is alert. He is disoriented ( Oriented to person and place only; not time or situation.).  Skin: Skin is warm and dry. No rash noted. He is not diaphoretic. No erythema.  Psychiatric: He has a normal mood and affect.  Vitals reviewed.   ED Results and Treatments Labs (all labs ordered are listed, but only abnormal results are displayed) Labs Reviewed  COMPREHENSIVE METABOLIC PANEL - Abnormal; Notable for the following components:      Result Value   Glucose, Bld 101 (*)    BUN 35 (*)    Creatinine, Ser 1.63 (*)    AST 42 (*)    GFR calc non Af Amer 48 (*)    GFR calc Af Amer 56 (*)    All other components within normal limits  URINALYSIS, COMPLETE (UACMP) WITH MICROSCOPIC - Abnormal; Notable for the following components:   Hgb urine dipstick MODERATE (*)    All other components within normal limits  CBC WITH DIFFERENTIAL/PLATELET  ETHANOL  RAPID URINE DRUG SCREEN, HOSP PERFORMED  CBG MONITORING, ED                                                                                                                         EKG  EKG  Interpretation  Date/Time:  Monday October 22 2017 05:20:41 EDT Ventricular Rate:  86 PR Interval:  140 QRS Duration: 76 QT Interval:  390 QTC Calculation: 466 R Axis:   50 Text Interpretation:  Normal sinus rhythm Normal ECG No significant change since last tracing Confirmed by Drema Pry [phone]) on 10/22/2017 5:24:40 AM Also confirmed by Drema Pry [phone]), editor Elita Quick ([id number])  on 10/22/2017 7:04:27 AM      Radiology Ct Head Wo Contrast  Result Date: 10/22/2017 CLINICAL DATA:  Altered mental status of unclear cause EXAM: CT HEAD WITHOUT CONTRAST TECHNIQUE: Contiguous axial images were obtained from the base of the skull through the vertex without intravenous contrast. COMPARISON:  None. FINDINGS: Brain: No evidence of acute infarction, hemorrhage, hydrocephalus, extra-axial collection or mass lesion/mass effect. Vascular: No hyperdense vessel or unexpected calcification. Skull: Normal. Negative for fracture or focal lesion. Sinuses/Orbits: Negative IMPRESSION: Negative head CT Electronically Signed   By: Marnee Spring M.D.   On: 10/22/2017 04:25   Pertinent labs & imaging results that were available during my care of the patient were reviewed by me and considered in my medical decision making (see chart for details).  Medications Ordered in ED Medications  sodium chloride 0.9 % bolus 1,000 mL (1,000 mLs Intravenous New Bag/Given 10/22/17 0528)    And  0.9 %  sodium chloride infusion (has no administration in  time range)                                                                                                                                    Procedures Procedures  (including critical care time)  Medical Decision Making / ED Course I have reviewed the nursing notes for this encounter and the patient's prior records (if available in EHR or on provided paperwork).    Altered mental status likely from Polypharm (multiple muscle relaxers).  Patient is  afebrile with no obvious signs of infectious process.  Doubt meningitis.  No electrolyte derangements.  CT head unremarkable.  Patient will be admitted to medicine for continued monitoring and observation.  Final Clinical Impression(s) / ED Diagnoses Final diagnoses:  Delusion (HCC)  Polypharmacy      This chart was dictated using voice recognition software.  Despite best efforts to proofread,  errors can occur which can change the documentation meaning.   Nira Conn, MD 10/22/17 [phone]

## 2017-10-23 DIAGNOSIS — R4182 Altered mental status, unspecified: Secondary | ICD-10-CM | POA: Diagnosis not present

## 2017-10-23 DIAGNOSIS — R441 Visual hallucinations: Secondary | ICD-10-CM | POA: Diagnosis not present

## 2017-10-23 DIAGNOSIS — T50901A Poisoning by unspecified drugs, medicaments and biological substances, accidental (unintentional), initial encounter: Secondary | ICD-10-CM

## 2017-10-23 DIAGNOSIS — I10 Essential (primary) hypertension: Secondary | ICD-10-CM | POA: Diagnosis not present

## 2017-10-23 LAB — COMPREHENSIVE METABOLIC PANEL
ALBUMIN: 3 g/dL — AB (ref 3.5–5.0)
ALK PHOS: 51 U/L (ref 38–126)
ALT: 16 U/L (ref 0–44)
AST: 24 U/L (ref 15–41)
Anion gap: 5 (ref 5–15)
BUN: 19 mg/dL (ref 6–20)
CALCIUM: 8.1 mg/dL — AB (ref 8.9–10.3)
CHLORIDE: 110 mmol/L (ref 98–111)
CO2: 25 mmol/L (ref 22–32)
Creatinine, Ser: 1.27 mg/dL — ABNORMAL HIGH (ref 0.61–1.24)
GFR calc Af Amer: >60 mL/min (ref >60)
GFR calc non Af Amer: >60 mL/min (ref >60)
GLUCOSE: 98 mg/dL (ref 70–99)
Potassium: 4 mmol/L (ref 3.5–5.1)
SODIUM: 140 mmol/L (ref 135–145)
Total Bilirubin: 1 mg/dL (ref 0.3–1.2)
Total Protein: 5.8 g/dL — ABNORMAL LOW (ref 6.5–8.1)

## 2017-10-23 LAB — GLUCOSE, CAPILLARY: Glucose-Capillary: 92 mg/dL (ref 70–99)

## 2017-10-23 LAB — HIV ANTIBODY (ROUTINE TESTING W REFLEX): HIV Screen 4th Generation wRfx: NONREACTIVE

## 2017-10-23 NOTE — Progress Notes (Signed)
Patient discharged to home. Patient AVS reviewed and signed. Patient capable re-verbalizing medications and follow-up appointments. IV removed. Patient belongings sent with patient. Patient educated to return to the ED in the event of SOB, chest pain or dizziness.   Saren Corkern B. RN 

## 2017-10-23 NOTE — Progress Notes (Addendum)
   Subjective: No overnight events. Bryan [name] is alert and oriented x3 this morning. He states that he was brought to the hospital yesterday because people thought he was hallucinating. He denies confusion or AVH this morning. He reports the toradol has been helpful for his back pain. He has no new symptoms or concerns this morning. He has a neurosurgical f/u at 2pm today to discuss the results of his back MRI. His sister reports that she found a bottle of Xanax at his house that she does not believe was prescribed to him. Bryan [name] admitted to taking a Valium last week, but denied Xanax.  Objective:  Vital signs in last 24 hours: Vitals:   10/22/17 1008 10/22/17 1656 10/22/17 2208 10/23/17 0556  BP: (!) 139/91 (!) 146/93 (!) 147/94 121/78  Pulse: 89 88 94 85  Resp: 18 18 20 18   Temp: 98 F (36.7 C) 98.2 F (36.8 C) 98.8 F (37.1 C) 98.4 F (36.9 C)  TempSrc: Oral Oral Oral Oral  SpO2: 100% 98% 97% 95%  Weight:      Height:       Constitutional: Well-developed, well-nourished, and in no distress.  Eyes: Pupils are dilated to about 8mm. PEERL.EOM are normal.  Cardiovascular: Normal rate and regular rhythm. No murmurs, rubs, or gallops. Pulmonary/Chest: Effort normal. Clear to auscultation bilaterally. No wheezes, rales, or rhonchi. Neuro: Alert and oriented x3. Answers questions appropriately. Follows commands. Skin: Warm and dry. No rashes or wounds.  Assessment/Plan:  Active Problems:   Drug overdose  Bryan Loving Lightseyis a 47 y.o.gentleman with medical historysignificant for hypertension, nephrolithiasis, and chronic back pain who was brought to the ED via EMS with altered mental status and hallucinations likely secondary to inadvertent medication overdose.  Inadvertent medication overdose - Today the patient is alert and oriented x3 without AVH. Tizanidine overdose can cause hepatic dysfunction, hypotension, and hallucinations. Tizanidine withdrawal can cause  rebound hypertension, tachycardia, and hypertonia. The patient has not exhibited any of these symptoms, his vitals are stable, and his hepatic panel is normal. His hallucinations may have also occurred in the setting of benzodiazepine toxicity or withdrawal, but he is currently stable. He is safe for discharge home. Advised to avoid taking any muscle relaxants or benzodiazepines until he can follow-up with his PCP and neurosurgeon.  Plan - Discharge home today  Chronic Back Pain - Currently well-controlled with NSAIDs. Patient plans to f/u with neurosurgery today about his back. Plan - D/c muscle relaxants - Continue home Percocet, NSAIDs, and lidocaine patch for chronic back pain  HTN - Was holding home lisinopril in case of hypotension. He has been in the 140s systolic. Plan - Resume lisinopril on discharge  Dispo: Anticipated discharge today.  Bryan [name], Cathleen Corti, MD 10/23/2017, 6:19 AM Pager: [phone]

## 2017-10-23 NOTE — Discharge Summary (Addendum)
Name: Bryan [name] MRN: [id number] DOB: Feb 16, 1970 47 y.o. PCP: Barbie Banner, MD  Date of Admission: 10/22/2017  2:51 AM Date of Discharge:  Attending Physician: Levert Feinstein, MD  Discharge Diagnosis: 1. Inadvertent medication overdose 2. Chronic back pain 3. Hypertension  Discharge Medications: Allergies as of 10/23/2017      Reactions   Adhesive [tape] Hives   NO PLASTIC tape, please!!      Medication List    STOP taking these medications   cyclobenzaprine 10 MG tablet Commonly known as:  FLEXERIL   methocarbamol 500 MG tablet Commonly known as:  ROBAXIN   methylPREDNISolone 4 MG Tbpk tablet Commonly known as:  MEDROL DOSEPAK   ondansetron 4 MG disintegrating tablet Commonly known as:  ZOFRAN-ODT   phenazopyridine 200 MG tablet Commonly known as:  PYRIDIUM   tiZANidine 4 MG capsule Commonly known as:  ZANAFLEX     TAKE these medications   acetaminophen 325 MG tablet Commonly known as:  TYLENOL Take 2 tablets (650 mg total) by mouth every 6 (six) hours as needed for mild pain (or temp > 100).   gabapentin 100 MG capsule Commonly known as:  NEURONTIN Take 100 mg by mouth 3 (three) times daily as needed (for burning pain).   lidocaine 5 % Commonly known as:  LIDODERM Place 1 patch onto the skin daily. Remove & Discard patch within 12 hours or as directed by MD   lisinopril 40 MG tablet Commonly known as:  PRINIVIL,ZESTRIL Take 40 mg by mouth daily.   meloxicam 15 MG tablet Commonly known as:  MOBIC Take 15 mg by mouth daily as needed (for tennis elbow).   minocycline 50 MG capsule Commonly known as:  MINOCIN,DYNACIN Take 50 mg by mouth 2 (two) times daily.   nabumetone 750 MG tablet Commonly known as:  RELAFEN Take 750 mg by mouth 2 (two) times daily.   oxyCODONE-acetaminophen 5-325 MG tablet Commonly known as:  PERCOCET/ROXICET Take 1 tablet by mouth every 8 (eight) hours as needed for severe pain.   tamsulosin 0.4 MG Caps  capsule Commonly known as:  FLOMAX Take 1 capsule (0.4 mg total) by mouth daily after breakfast.       Disposition and follow-up:   Bryan [name] was discharged from [facility] in Good condition.  At the hospital follow up visit please address:  1.  Inadvertent medication overdose: Patient taking multiple medications for chronic back pain including tizanidine, flexeril, robaxin, percocet, nsaids, lidocaine patch, and possibly benzodiazepines. Tizanidine overdose or medication interactions led to a period of hallucinations and combativeness. Discontinued muscle relaxants during admission. Please re-evaluate appropriate pain regimen.   2.  Labs / imaging needed at time of follow-up: none  3.  Pending labs/ test needing follow-up: none  Follow-up Appointments: Follow-up Information    Barbie Banner, MD Follow up in 1 week(s).   Specialty:  Family Medicine Contact information: [address] [place] [address]           Hospital Course by problem list: Bryan [name] a 47 y.o. gentleman with medical history significant for hypertension, nephrolithiasis, and chronic back pain who was brought to the ED via EMS with altered mental status and hallucinations likely secondary to inadvertent medication overdose.  1. Inadvertent medication overdose: Patient presented after calling the cops about neighbors surrounding his house and trying to rob him. Police found him with a loaded gun on his porch and no other people. The  patient was initially combative and police were concerned for altered mentation and possible hallucinations, so he was brought to the ED. An empty bottle of Zanaflex (60 4mg  tablets filled 10/04/17) was found in the house. The patient reported taking 3-4 tizanidine at a time over the past few days for worsening chronic back pain. He has prescriptions for flexeril and robaxin as well, but he reported he hadrun out of those and hadn't  taken any in the days leading up to hospitalization. He also recently began taking percocet and a medrol dose pack for his back pain. He also admitted to using Valium recently. The patient quickly improved to normal mentation during his admission. His hallucinations and agitation were attributed to an inadvertent tizanidine overdose vs polypharmacy medication interactions. He was observed overnight for tizanidine overdose symptoms including hypotension, hepatic dysfunction, and hallucinations, as well as tizanidine withdrawal symptoms, including hypertension, tachycardia, and hypertonia. He denied SI/HI/AVH. He developed none of these symptoms and was medically appropriate for discharge home.   2. Chronic back pain: His back pain was well-controlled with toradol and lidocaine patches during the admission. Upon discharge, he was advised to discontinue the muscle relaxants and avoid benzodiazepines. He was discharged on his home percocet, nsaids, and lidocaine patches. He was advised to f/u with neurosurgery on 10/8 for his back pain as previously scheduled.   3. Hypertension: His home lisinopril was held while observing for hypotension. He remained in the 140s systolic during this admission. Resumed lisinopril upon discharge.  Discharge Vitals:   BP 136/72 (BP Location: Left Arm)   Pulse 80   Temp 98.2 F (36.8 C) (Oral)   Resp 19   Ht 5\' 10"  (1.778 m)   Wt 125 kg   SpO2 96%   BMI 39.53 kg/m   Pertinent Labs, Studies, and Procedures:  CMP Latest Ref Rng & Units 10/23/2017 10/22/2017 10/22/2017  Glucose 70 - 99 mg/dL 98 - 401(U)  BUN 6 - 20 mg/dL 19 - 27(O)  Creatinine 0.61 - 1.24 mg/dL 5.36(U) 4.40(H) 4.74(Q)  Sodium 135 - 145 mmol/L 140 - 141  Potassium 3.5 - 5.1 mmol/L 4.0 - 4.5  Chloride 98 - 111 mmol/L 110 - 109  CO2 22 - 32 mmol/L 25 - 25  Calcium 8.9 - 10.3 mg/dL 8.1(L) - 9.0  Total Protein 6.5 - 8.1 g/dL 5.9(D) - 6.6  Total Bilirubin 0.3 - 1.2 mg/dL 1.0 - 1.1  Alkaline Phos 38 - 126  U/L 51 - 58  AST 15 - 41 U/L 24 - 42(H)  ALT 0 - 44 U/L 16 - 23   CBC Latest Ref Rng & Units 10/22/2017 10/22/2017 05/25/2017  WBC 4.0 - 10.5 K/uL 7.4 8.8 9.0  Hemoglobin 13.0 - 17.0 g/dL 63.8 75.6 12.5(L)  Hematocrit 39.0 - 52.0 % 35.9(L) 39.8 37.8(L)  Platelets 150 - 400 K/uL 199 196 243   Head CT: negative  Discharge Instructions: Discharge Instructions    Discharge instructions   Complete by:  As directed    It was a pleasure taking care of you, Mr. Lavalley!  1. You were admitted to the hospital for a medication overdose/medication interactions. Your lab work and vital signs have been normal, and you are safe for discharge home.   2. Please stop taking muscle relaxants for your back pain until you can follow-up with your PCP to discuss your pain regimen. We believe the muscle relaxants caused your symptoms. Please also avoid any medications that were not prescribed to you, including Xanax and  Valium, as they can also cause such symptoms.  3. You can take percocet, tylenol, meloxicam, nabumetone, or lidocaine patches for your back pain until you have a change to see your PCP. Do not take percocet and tylenol together as they both contain acetaminophen.   4. Please follow-up with the neurosurgeon today and your PCP within one week. Please see a doctor sooner if you experience any confusion.  Feel free to call our clinic at [phone] if you have any questions.  Thanks! Dr. Avie Arenas      Signed: Dorrell, Cathleen Corti, MD 10/23/2017, 11:22 AM   Pager: [phone]

## 2017-11-27 ENCOUNTER — Encounter: Payer: Self-pay | Admitting: Neurology

## 2017-11-27 ENCOUNTER — Ambulatory Visit (INDEPENDENT_AMBULATORY_CARE_PROVIDER_SITE_OTHER): Payer: BLUE CROSS/BLUE SHIELD | Admitting: Neurology

## 2017-11-27 VITALS — BP 160/104 | HR 81 | Ht 70.0 in | Wt 287.0 lb

## 2017-11-27 DIAGNOSIS — Z9189 Other specified personal risk factors, not elsewhere classified: Secondary | ICD-10-CM

## 2017-11-27 DIAGNOSIS — G4719 Other hypersomnia: Secondary | ICD-10-CM

## 2017-11-27 DIAGNOSIS — G479 Sleep disorder, unspecified: Secondary | ICD-10-CM | POA: Diagnosis not present

## 2017-11-27 DIAGNOSIS — R0683 Snoring: Secondary | ICD-10-CM | POA: Diagnosis not present

## 2017-11-27 DIAGNOSIS — Z6841 Body Mass Index (BMI) 40.0 and over, adult: Secondary | ICD-10-CM

## 2017-11-27 DIAGNOSIS — R030 Elevated blood-pressure reading, without diagnosis of hypertension: Secondary | ICD-10-CM

## 2017-11-27 DIAGNOSIS — G478 Other sleep disorders: Secondary | ICD-10-CM

## 2017-11-27 NOTE — Progress Notes (Signed)
Subjective:    Patient ID: Bryan [name] is a 47 y.o. male.  HPI     Bryan Foley, MD, PhD [facility] [address] [address] [place], [place] [address]  Dear Dr. Ollen Bowl,  I saw your patient, Bryan [name], upon your kind request in my sleep clinic today for initial consultation of his sleep disorder, in particular, concern for underlying obstructive sleep apnea. The patient is unaccompanied today. As you know, Bryan [name] is a 47 year old right-handed gentleman with an underlying medical history of chronic back pain, with recent hospitalization secondary to polypharmacy (possible overuse of muscle relaxants, leading to hallucinations and delusions), history of diverticulitis, hypertension and morbid obesity with a BMI of over 40, who reports snoring and excessive daytime somnolence. I reviewed your office note from 11/05/2017. He was noted to have some lower oxygen saturations during his L-spine injection recently. His Epworth sleepiness score is 9 out of 24 today, fatigue score is 33 out of 63. He is single, lives with his mother and his brother, he has no children. He does not smoke or utilize alcohol and does not drink caffeine on a regular basis. 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 He has currently no prescription for narcotic pain meds. He has a bedtime between midnight and 1 AM currently but generally when he is working he tries to be in bed at 11:30. He has a TV in the bedroom and turns it off before falling asleep. Rise time is typically 5:30 AM for work. He has been waking up earlier than this and also has multiple nighttime awakenings with no apparent reason. He denies recurrent morning headaches or nocturia on a night to night basis. He is not aware of any family history of OSA. He has been able to lose weight in the recent past, up to 40 pounds, but since he has been less active in the past couple of  months he has gained weight back.  His Past Medical History Is Significant For: Past Medical History:  Diagnosis Date  . History of diverticulitis of colon 07/30/2014   07-30-2014  acute diverticulitis;  08-21-2014 recurrent diverticulitis w/ microperforation, 08-26-2014 s/p partial colectomy (post-op intra-abdominal abscesses and complication wound healing)  . Hypertension   . Right ureteral stone     His Past Surgical History Is Significant For: Past Surgical History:  Procedure Laterality Date  . APPENDECTOMY  1981  . CYSTOSCOPY/URETEROSCOPY/HOLMIUM LASER/STENT PLACEMENT Right 06/08/2017   Procedure: CYSTOSCOPY/RETROGRADE/URETEROSCOPY/HOLMIUM LASER/STENT PLACEMENT;  Surgeon: Rene Paci, MD;  Location: [facility];  Service: Urology;  Laterality: Right;  ONLY NEEDS 45 MIN  . ILEOSTOMY CLOSURE N/A 12/31/2014   Procedure: ILEOSTOMY CLOSURE;  Surgeon: Harriette Bouillon, MD;  Location: [facility] OR;  Service: General;  Laterality: N/A;  . INCISIONAL HERNIA REPAIR N/A 11/02/2015   Procedure: LAPAROSCOPIC INCISIONAL HERNIA;  Surgeon: Harriette Bouillon, MD;  Location: [facility] OR;  Service: General;  Laterality: N/A;  . INSERTION OF MESH N/A 11/02/2015   Procedure: INSERTION OF MESH;  Surgeon: Harriette Bouillon, MD;  Location: [facility] OR;  Service: General;  Laterality: N/A;  . LAPAROSCOPIC PARTIAL COLECTOMY N/A 08/26/2014   Procedure: LAPAROSCOPIC ASSISTED PARTIAL COLECTOMY WITH OSTOMY;  Surgeon: Harriette Bouillon, MD;  Location: [facility] OR;  Service: General;  Laterality: N/A;    His Family History Is Significant For: Family History  Problem Relation Age of Onset  . Cancer Father        lung  His Social History Is Significant For: Social History   Socioeconomic History  . Marital status: Single    Spouse name: Not on file  . Number of children: Not on file  . Years of education: Not on file  . Highest education level: Not on file  Occupational History  . Not on file  Social Needs   . Financial resource strain: Not on file  . Food insecurity:    Worry: Not on file    Inability: Not on file  . Transportation needs:    Medical: Not on file    Non-medical: Not on file  Tobacco Use  . Smoking status: Never Smoker  . Smokeless tobacco: Never Used  Substance and Sexual Activity  . Alcohol use: No  . Drug use: No  . Sexual activity: Yes  Lifestyle  . Physical activity:    Days per week: Not on file    Minutes per session: Not on file  . Stress: Not on file  Relationships  . Social connections:    Talks on phone: Not on file    Gets together: Not on file    Attends religious service: Not on file    Active member of club or organization: Not on file    Attends meetings of clubs or organizations: Not on file    Relationship status: Not on file  Other Topics Concern  . Not on file  Social History Narrative  . Not on file    His Allergies Are:  Allergies  Allergen Reactions  . Adhesive [Tape] Hives    NO PLASTIC tape, please!!  :   His Current Medications Are:  Outpatient Encounter Medications as of 11/27/2017  Medication Sig  . acetaminophen (TYLENOL) 325 MG tablet Take 2 tablets (650 mg total) by mouth every 6 (six) hours as needed for mild pain (or temp > 100).  . gabapentin (NEURONTIN) 100 MG capsule Take 100 mg by mouth 3 (three) times daily as needed (for burning pain).  Marland Kitchen lisinopril (PRINIVIL,ZESTRIL) 40 MG tablet Take 40 mg by mouth daily.   . meloxicam (MOBIC) 15 MG tablet Take 15 mg by mouth daily as needed (for tennis elbow).   . minocycline (MINOCIN,DYNACIN) 50 MG capsule Take 50 mg by mouth 2 (two) times daily.   . nabumetone (RELAFEN) 750 MG tablet Take 750 mg by mouth 2 (two) times daily.  . tamsulosin (FLOMAX) 0.4 MG CAPS capsule Take 1 capsule (0.4 mg total) by mouth daily after breakfast.  . [DISCONTINUED] lidocaine (LIDODERM) 5 % Place 1 patch onto the skin daily. Remove & Discard patch within 12 hours or as directed by MD (Patient not  taking: Reported on 10/22/2017)  . [DISCONTINUED] oxyCODONE-acetaminophen (PERCOCET/ROXICET) 5-325 MG tablet Take 1 tablet by mouth every 8 (eight) hours as needed for severe pain. (Patient not taking: Reported on 10/22/2017)   No facility-administered encounter medications on file as of 11/27/2017.   :  Review of Systems:  Out of a complete 14 point review of systems, all are reviewed and negative with the exception of these symptoms as listed below:  Review of Systems  Neurological:       Pt presents today to discuss his sleep. Pt has never had a sleep study but does endorse snoring.  Epworth Sleepiness Scale 0= would never doze 1= slight chance of dozing 2= moderate chance of dozing 3= high chance of dozing  Sitting and reading: 2 Watching TV: 3 Sitting inactive in a public place (ex. Theater or  meeting): 0 As a passenger in a car for an hour without a break: 0 Lying down to rest in the afternoon: 3 Sitting and talking to someone: 0 Sitting quietly after lunch (no alcohol): 1 In a car, while stopped in traffic: 0 Total: 9     Objective:  Neurological Exam  Physical Exam Physical Examination:   Vitals:   11/27/17 1105  BP: (!) 184/122  Pulse: 81   Repeat blood pressure was 160/104.  General Examination: The patient is a very pleasant 47 y.o. male in no acute distress. He appears well-developed and well-nourished and well groomed.   HEENT: Normocephalic, atraumatic, pupils are equal, round and reactive to light and accommodation. Extraocular tracking is good without limitation to gaze excursion or nystagmus noted. Normal smooth pursuit is noted. Hearing is grossly intact. Face is symmetric with normal facial animation and normal facial sensation. Speech is clear with no dysarthria noted. There is no hypophonia. There is no lip, neck/head, jaw or voice tremor. Neck is supple with full range of passive and active motion. There are no carotid bruits on auscultation.  Oropharynx exam reveals: moderate mouth dryness, adequate dental hygiene and marked airway crowding, due to smaller airway entry, thicker soft palate, large uvula, tonsils are 1-2+ bilaterally. Mallampati is class III. Neck circumference is 20-3/4 inches. He has a minimal overbite. Tongue protrudes centrally and palate elevates symmetrically.  Chest: Clear to auscultation without wheezing, rhonchi or crackles noted.  Heart: S1+S2+0, regular and normal without murmurs, rubs or gallops noted.   Abdomen: Soft, non-tender and non-distended with normal bowel sounds appreciated on auscultation.  Extremities: There is no pitting edema in the distal lower extremities bilaterally. Pedal pulses are intact.  Skin: Warm and dry without trophic changes noted.  Musculoskeletal: exam reveals no obvious joint deformities, tenderness or joint swelling or erythema. Low back pain reported, non-radiating.  Neurologically:  Mental status: The patient is awake, alert and oriented in all 4 spheres. His immediate and remote memory, attention, language skills and fund of knowledge are appropriate. There is no evidence of aphasia, agnosia, apraxia or anomia. Speech is clear with normal prosody and enunciation. Thought process is linear. Mood is normal and affect is normal.  Cranial nerves II - XII are as described above under HEENT exam. In addition: shoulder shrug is normal with equal shoulder height noted. Motor exam: Normal bulk, strength and tone is noted. There is no drift, tremor or rebound. Romberg is negative. Reflexes are 1+ throughout. Fine motor skills and coordination: grossly intact with normal finger taps, normal hand movements, normal rapid alternating patting, normal foot taps and normal foot agility.  Cerebellar testing: No dysmetria or intention tremor on finger to nose testing. Heel to shin is unremarkable bilaterally. There is no truncal or gait ataxia.  Sensory exam: intact to light touch in the upper  and lower extremities.  Gait, station and balance: He stands Somewhat slowly, no abnormal posture noted. Walks slightly slowly, otherwise no limp.  Assessment and Plan:  In summary, Bryan LeschChristopher J [name] is a very pleasant 47 y.o.-year old male with an underlying medical history of chronic back pain, with recent hospitalization secondary to polypharmacy (possible overuse of muscle relaxants, leading to hallucinations and delusions), history of diverticulitis, hypertension and morbid obesity with a BMI of over 40, whose history and physical exam are concerning for obstructive sleep apnea (OSA). His risk for sleep apnea is increased secondary to morbid obesity, crowded airway, allergic circumference and witnessed desaturations during of recent procedure.  I had a long chat with the patient about my findings and the diagnosis of OSA, its prognosis and treatment options. We talked about medical treatments, surgical interventions and non-pharmacological approaches. I explained in particular the risks and ramifications of untreated moderate to severe OSA, especially with respect to developing cardiovascular disease down the Road, including congestive heart failure, difficult to treat hypertension, cardiac arrhythmias, or stroke. Even type 2 diabetes has, in part, been linked to untreated OSA. Symptoms of untreated OSA include daytime sleepiness, memory problems, mood irritability and mood disorder such as depression and anxiety, lack of energy, as well as recurrent headaches, especially morning headaches. We talked about trying to maintain a healthy lifestyle in general, as well as the importance of weight control. I encouraged the patient to eat healthy, exercise daily and keep well hydrated, to keep a scheduled bedtime and wake time routine, to not skip any meals and eat healthy snacks in between meals. I advised the patient not to drive when feeling sleepy. I recommended the following at this time: sleep study  with potential positive airway pressure titration. (We will score hypopneas at 3%).   I explained the sleep test procedure to the patient and also outlined possible surgical and non-surgical treatment options of OSA, including the use of a custom-made dental device (which would require a referral to a specialist dentist or oral surgeon), upper airway surgical options, such as pillar implants, radiofrequency surgery, tongue base surgery, and UPPP (which would involve a referral to an ENT surgeon). Rarely, jaw surgery such as mandibular advancement may be considered.  I also explained the CPAP treatment option to the patient, who indicated that he would be willing to try CPAP if the need arises. I explained the importance of being compliant with PAP treatment, not only for insurance purposes but primarily to improve His symptoms, and for the patient's long term health benefit, including to reduce His cardiovascular risks. I answered all his questions today and the patient was in agreement. I would like to see him back after the sleep study is completed and encouraged him to call with any interim questions, concerns, problems or updates.   Thank you very much for allowing me to participate in the care of this nice patient. If I can be of any further assistance to you please do not hesitate to call me at [phone].  Sincerely,   Bryan Foley, MD, PhD

## 2017-11-27 NOTE — Patient Instructions (Addendum)
Thank you for choosing [facility] for your sleep related care! It was nice to meet you today! I appreciate that you entrust me with your sleep related healthcare concerns. I hope, I was able to address at least some of your concerns today, and that I can help you feel reassured and also get better.    Here is what we discussed today and what we came up with as our plan for you:    Based on your symptoms and your exam I believe you are at risk for obstructive sleep apnea (aka OSA), and I think we should proceed with a sleep study to determine whether you do or do not have OSA and how severe it is. Even, if you have mild OSA, I may want you to consider treatment with CPAP, as treatment of even borderline or mild sleep apnea can result and improvement of symptoms such as sleep disruption, daytime sleepiness, nighttime bathroom breaks, restless leg symptoms, improvement of headache syndromes, even improved mood disorder.   Please remember, the long-term risks and ramifications of untreated moderate to severe obstructive sleep apnea are: increased Cardiovascular disease, including congestive heart failure, stroke, difficult to control hypertension, treatment resistant obesity, arrhythmias, especially irregular heartbeat commonly known as A. Fib. (atrial fibrillation); even type 2 diabetes has been linked to untreated OSA.   Sleep apnea can cause disruption of sleep and sleep deprivation in most cases, which, in turn, can cause recurrent headaches, problems with memory, mood, concentration, focus, and vigilance. Most people with untreated sleep apnea report excessive daytime sleepiness, which can affect their ability to drive. Please do not drive if you feel sleepy. Patients with sleep apnea developed difficulty initiating and maintaining sleep (aka insomnia).   Having sleep apnea may increase your risk for other sleep disorders, including involuntary behaviors sleep such as sleep terrors,  sleep talking, sleepwalking.    Having sleep apnea can also increase your risk for restless leg syndrome and leg movements at night.   Please note that untreated obstructive sleep apnea may carry additional perioperative morbidity. Patients with significant obstructive sleep apnea (typically, in the moderate to severe degree) should receive, if possible, perioperative PAP (positive airway pressure) therapy and the surgeons and particularly the anesthesiologists should be informed of the diagnosis and the severity of the sleep disordered breathing.   I will likely see you back after your sleep study to go over the test results and where to go from there. We will call you after your sleep study to advise about the results (most likely, you will hear from WalcottKristen, my nurse) and to set up an appointment at the time, as necessary.    Our sleep lab administrative assistant will call you to schedule your sleep study and give you further instructions, regarding the check in process for the sleep study, arrival time, what to bring, when you can expect to leave after the study, etc., and to answer any other logistical questions you may have. If you don't hear back from her by about 2 weeks from now, please feel free to call her direct line at [phone] or you can call our general clinic number, or email us through My Chart.  o

## 2017-11-29 ENCOUNTER — Other Ambulatory Visit: Payer: Self-pay | Admitting: Dermatology

## 2017-12-06 ENCOUNTER — Telehealth: Payer: Self-pay | Admitting: Neurology

## 2017-12-06 NOTE — Telephone Encounter (Signed)
Pt called on Monday 11/18 to get scheduled for his in lab sleep study. I go the pt scheduled. He did not have any questions or concerns. On 11/21 pt called the phone room to cancel his sleep study. Pt stated he is unable to sleep in a strange bed.

## 2018-05-24 ENCOUNTER — Ambulatory Visit: Payer: PRIVATE HEALTH INSURANCE | Attending: Anesthesiology

## 2018-05-24 ENCOUNTER — Other Ambulatory Visit: Payer: Self-pay

## 2018-05-24 DIAGNOSIS — M47816 Spondylosis without myelopathy or radiculopathy, lumbar region: Secondary | ICD-10-CM | POA: Insufficient documentation

## 2018-05-24 NOTE — Therapy (Signed)
[facility] [address] [place], [place], [address] Phone: [phone]   Fax:  [phone]  Patient Details  Name: Bryan [name] MRN: [id number] Date of Birth: October 16, 1970 Referring Provider:  Odette Fraction, MD  Encounter Date: 05/24/2018      [name] arrived for his FCE  Testing and his blood pressure was 226/137 and this level exceeded the [employer] protocol limit of blood pressure max of  150/100. He was asked to return to his primary care MD for assessment and clearance to return for the FCE.  We will do this as soon as possible.   [name] 05/24/2018, 9:12 AM  [facility] [address] [place], [place], [address] Phone: [phone]   Fax:  [phone]

## 2018-05-31 ENCOUNTER — Telehealth: Payer: Self-pay | Admitting: Physical Therapy

## 2018-05-31 NOTE — Telephone Encounter (Signed)
Spoke to Bryan [name] and he has MD note for clearance to resume FCE.  We scheduled for 06/03/18 at 8 AM.

## 2018-06-03 ENCOUNTER — Other Ambulatory Visit: Payer: Self-pay

## 2018-06-03 ENCOUNTER — Ambulatory Visit: Payer: PRIVATE HEALTH INSURANCE

## 2018-06-03 VITALS — BP 170/98 | HR 77

## 2018-06-03 DIAGNOSIS — M47816 Spondylosis without myelopathy or radiculopathy, lumbar region: Secondary | ICD-10-CM

## 2018-06-03 NOTE — Therapy (Signed)
[place], [place], [address] Phone: [phone]   Fax:  [phone]  Physical Therapy Evaluation/ FCE  Patient Details  Name: Bryan [name] MRN: [id number] Date of Birth: 1970/11/18 Referring Provider (PT): Clydell Hakim, MD   Encounter Date: 06/03/2018  PT End of Session - 06/03/18 0804    Visit Number  1    Number of Visits  1    Authorization Type  [employer]    PT Start Time  0758    PT Stop Time  1100    PT Time Calculation (min)  182 min    Activity Tolerance  Patient tolerated treatment well    Behavior During Therapy  [facility] for tasks assessed/performed       Past Medical History:  Diagnosis Date  . History of diverticulitis of colon 07/30/2014   07-30-2014  acute diverticulitis;  08-21-2014 recurrent diverticulitis w/ microperforation, 08-26-2014 s/p partial colectomy (post-op intra-abdominal abscesses and complication wound healing)  . Hypertension   . Right ureteral stone     Past Surgical History:  Procedure Laterality Date  . APPENDECTOMY  1981  . CYSTOSCOPY/URETEROSCOPY/HOLMIUM LASER/STENT PLACEMENT Right 06/08/2017   Procedure: CYSTOSCOPY/RETROGRADE/URETEROSCOPY/HOLMIUM LASER/STENT PLACEMENT;  Surgeon: Ceasar Mons, MD;  Location: [facility];  Service: Urology;  Laterality: Right;  ONLY NEEDS 45 MIN  . ILEOSTOMY CLOSURE N/A 12/31/2014   Procedure: ILEOSTOMY CLOSURE;  Surgeon: Erroll Luna, MD;  Location: [place];  Service: General;  Laterality: N/A;  . INCISIONAL HERNIA REPAIR N/A 11/02/2015   Procedure: LAPAROSCOPIC INCISIONAL HERNIA;  Surgeon: Erroll Luna, MD;  Location: [place];  Service: General;  Laterality: N/A;  . INSERTION OF MESH N/A 11/02/2015   Procedure: INSERTION OF MESH;  Surgeon: Erroll Luna, MD;  Location: [place];  Service: General;  Laterality: N/A;  . LAPAROSCOPIC PARTIAL COLECTOMY N/A 08/26/2014   Procedure: LAPAROSCOPIC ASSISTED  PARTIAL COLECTOMY WITH OSTOMY;  Surgeon: Erroll Luna, MD;  Location: [place];  Service: General;  Laterality: N/A;    Vitals:   06/03/18 0814  BP: (!) 170/98  Pulse: 77     Subjective Assessment - 06/03/18 0802    Subjective  Medication working now. New meds I don't know what the names are.     Currently in Pain?  No/denies         [facility] PT Assessment - 06/03/18 0001      Assessment   Medical Diagnosis  lumbar spondylosis    Referring Provider (PT)  Clydell Hakim, MD    Next MD Visit  None    Prior Therapy  Yes      Precautions   Precautions  None      Restrictions   Weight Bearing Restrictions  No      Cognition   Overall Cognitive Status  Within Functional Limits for tasks assessed      Ambulation/Gait   Gait Comments  WNL        During the FCE Mr Bernet's BP was variable and elevated for the whole testing. He had only 1 BP below 150/100  Of  146/104.          Objective measurements completed on examination: See above findings.              PT Education - 06/03/18 1145    Education Details  Reviewed FCE and results . Sugggested e monitor BP after activity at home to see if BP level is high with activity at home.  Person(s) Educated  Patient    Methods  Explanation    Comprehension  Verbalized understanding                  Plan - 06/03/18 0805    Clinical Impression Statement  Mr Maclin completed the FCe process without pain but with continued increased BP.  He may have some anxiety with the testing  but I have asked him to check his BP after activity at hoem to assess any significnat increase at home. The FCE testing was limited due to high BP so we addressed specific tasks he did on the job and did not include activity he said he never did at work.  Physically he appears to be able to return to work.      PT Frequency  One time visit    PT Next Visit Plan  Follow up with PCP as needed and with Dr Maryjean Ka as needed to return  to work .  FCE report will be faxed to Dr Vickki Hearing and Agree with Plan of Care  Patient       Patient will benefit from skilled therapeutic intervention in order to improve the following deficits and impairments:     Visit Diagnosis: Spondylosis of lumbar region without myelopathy or radiculopathy     Problem List Patient Active Problem List   Diagnosis Date Noted  . Drug overdose 10/22/2017  . Incisional hernia without obstruction or gangrene 11/02/2015  . Incisional hernia 11/02/2015  . Ileostomy in place [facility]) 12/31/2014  . Diverticulitis 09/02/2014  . Diverticulitis of colon with perforation 08/21/2014  . Acute diverticulitis 07/30/2014  . Traumatic hematoma of thigh 03/19/2012    Darrel Hoover  PT 06/03/2018, 11:51 AM  [facility] [address] [place], [place], [address] Phone: [phone]   Fax:  [phone]  Name: OCIE TINO MRN: [id number] Date of Birth: [birth year] PHYSICAL THERAPY DISCHARGE SUMMARY  Visits from Start of Care: FCE only  Current functional level related to goals / functional outcomes: See FCE report in EPIC   Remaining deficits: See FCE report in EPIC   Education / Equipment: Monitoring BP at home  Plan: Patient agrees to discharge.  Patient goals were not met. Patient is being discharged due to                                                     ?????    Completing FCE

## 2018-06-14 ENCOUNTER — Telehealth: Payer: Self-pay | Admitting: Physical Therapy

## 2018-06-14 NOTE — Telephone Encounter (Signed)
Left message on Mr Bryan [name]'s phone the the FCE report addressed specifically what activity we did and since the activities done were related to work that should suffice for return to work.  I will call him next week to see if he has any more questions.

## 2018-10-24 ENCOUNTER — Ambulatory Visit (INDEPENDENT_AMBULATORY_CARE_PROVIDER_SITE_OTHER): Payer: BC Managed Care – PPO | Admitting: Critical Care Medicine

## 2018-10-24 ENCOUNTER — Other Ambulatory Visit: Payer: Self-pay

## 2018-10-24 ENCOUNTER — Encounter: Payer: Self-pay | Admitting: Critical Care Medicine

## 2018-10-24 VITALS — BP 136/80 | HR 79 | Temp 97.3°F | Ht 70.0 in | Wt 291.6 lb

## 2018-10-24 DIAGNOSIS — J3089 Other allergic rhinitis: Secondary | ICD-10-CM | POA: Diagnosis not present

## 2018-10-24 DIAGNOSIS — R05 Cough: Secondary | ICD-10-CM | POA: Diagnosis not present

## 2018-10-24 DIAGNOSIS — R053 Chronic cough: Secondary | ICD-10-CM

## 2018-10-24 MED ORDER — FLUTICASONE PROPIONATE 50 MCG/ACT NA SUSP: 2.0000 | Freq: Every day | NASAL | 2 refills | Status: DC

## 2018-10-24 NOTE — Patient Instructions (Addendum)
Thank you for visiting Dr. Carlis Abbott at [facility]. We recommend the following:  Take Claritin every day.  Meds ordered this encounter  Medications  . fluticasone (FLONASE) 50 MCG/ACT nasal spray    Sig: Place 2 sprays into both nostrils daily.    Dispense:  16 g    Refill:  2    Take both medications every day until follow up with me. Please talk to your primary care provider about switching your lisinopril to another BP medication.  Return in about 3 months (around 01/24/2019).    Please do your part to reduce the spread of COVID-19.

## 2018-10-24 NOTE — Progress Notes (Signed)
   Subjective:    Patient ID: Bryan [name], male    DOB: 02/17/1970, 48 y.o.   MRN: [id number]  HPI    Review of Systems  Constitutional: Negative for fever and unexpected weight change.  HENT: Positive for congestion and sneezing. Negative for dental problem, ear pain, nosebleeds, postnasal drip, rhinorrhea, sinus pressure, sore throat and trouble swallowing.   Eyes: Negative for redness and itching.  Respiratory: Positive for cough. Negative for chest tightness, shortness of breath and wheezing.   Cardiovascular: Negative for palpitations and leg swelling.  Gastrointestinal: Negative for nausea and vomiting.  Genitourinary: Negative for dysuria.  Musculoskeletal: Negative for joint swelling.  Skin: Negative for rash.  Allergic/Immunologic: Negative.  Negative for environmental allergies, food allergies and immunocompromised state.  Neurological: Negative for headaches.  Hematological: Does not bruise/bleed easily.  Psychiatric/Behavioral: Negative for dysphoric mood. The patient is not nervous/anxious.        Objective:   Physical Exam        Assessment & Plan:

## 2018-10-24 NOTE — Progress Notes (Signed)
Synopsis: Referred in October 2020 for chronic cough by Bryan Banner, MD  Subjective:   PATIENT ID: Bryan [name], MRN: [id number]  Chief Complaint  Patient presents with  . Consult    Congestion for the past 7 months. Productive cough with clear phlegm. Has been on rounds of prednisone and abx with no relief. Increased fatigue after coughing spells.     Bryan [name] is a 48 year old gentleman referred for evaluation of chronic cough.  His cough began in early March, has been intermittent with periods of resolution, but over time has worsened.  He has never had this problem before this year.  He has tried treating this with multiple over-the-counter meds including Claritin, Vicks, Mucinex, and he was prescribed prednisone recently.  With Claritin and prednisone his symptoms resolved for a few days, but recurred after taking it.  He has nasal itching, sore throat, nasal congestion and drainage, postnasal drip, occasional sneezing, but denies a history of allergies.  His symptoms are worse with eating and at night when he tries to go to bed.  He has heartburn associated with eating spicy foods, usually less than once a month.  He denies water brash.  He denies a personal or family history of asthma.  He denies fevers, chills, sweats, reduced appetite, unplanned weight loss, chest pain, leg edema.  He was tested and found to be negative for COVID a few weeks ago by his primary care provider.  He received his flu shot at [facility] about a month ago.  He has never smoked or vape.  He takes lisinopril for hypertension, and has been on this medication for several years.  When he started this medication he had chronic cough, which resolved over time.  He works in a [employer], but is not exposed to chemicals.  His symptoms are not worse at work, and when he was off for a prolonged period this summer due to a back injury, his symptoms did not improve.     Past Medical History:  Diagnosis Date  . History of diverticulitis of colon 07/30/2014   07-30-2014  acute diverticulitis;  08-21-2014 recurrent diverticulitis w/ microperforation, 08-26-2014 s/p partial colectomy (post-op intra-abdominal abscesses and complication wound healing)  . Hypertension   . Right ureteral stone      Family History  Problem Relation Age of Onset  . Cancer Father        lung  . Heart disease Father   . Diabetes Maternal Grandmother      Past Surgical History:  Procedure Laterality Date  . APPENDECTOMY  1981  . CYSTOSCOPY/URETEROSCOPY/HOLMIUM LASER/STENT PLACEMENT Right 06/08/2017   Procedure: CYSTOSCOPY/RETROGRADE/URETEROSCOPY/HOLMIUM LASER/STENT PLACEMENT;  Surgeon: Bryan Paci, MD;  Location: [facility];  Service: Urology;  Laterality: Right;  ONLY NEEDS 45 MIN  . ILEOSTOMY CLOSURE N/A 12/31/2014   Procedure: ILEOSTOMY CLOSURE;  Surgeon: Bryan Bouillon, MD;  Location: [facility] OR;  Service: General;  Laterality: N/A;  . INCISIONAL HERNIA REPAIR N/A 11/02/2015   Procedure: LAPAROSCOPIC INCISIONAL HERNIA;  Surgeon: Bryan Bouillon, MD;  Location: [facility] OR;  Service: General;  Laterality: N/A;  . INSERTION OF MESH N/A 11/02/2015   Procedure: INSERTION OF MESH;  Surgeon: Bryan Bouillon, MD;  Location: [facility] OR;  Service: General;  Laterality: N/A;  . LAPAROSCOPIC PARTIAL COLECTOMY N/A 08/26/2014   Procedure: LAPAROSCOPIC ASSISTED PARTIAL COLECTOMY WITH OSTOMY;  Surgeon: Bryan Bouillon, MD;  Location: [facility] OR;  Service: General;  Laterality: N/A;  Social History   Socioeconomic History  . Marital status: Single    Spouse name: Not on file  . Number of children: Not on file  . Years of education: Not on file  . Highest education level: Not on file  Occupational History  . Not on file  Social Needs  . Financial resource strain: Not on file  . Food insecurity    Worry: Not on file    Inability: Not on file  . Transportation needs     Medical: Not on file    Non-medical: Not on file  Tobacco Use  . Smoking status: Never Smoker  . Smokeless tobacco: Never Used  Substance and Sexual Activity  . Alcohol use: No  . Drug use: No  . Sexual activity: Yes  Lifestyle  . Physical activity    Days per week: Not on file    Minutes per session: Not on file  . Stress: Not on file  Relationships  . Social Musician on phone: Not on file    Gets together: Not on file    Attends religious service: Not on file    Active member of club or organization: Not on file    Attends meetings of clubs or organizations: Not on file    Relationship status: Not on file  . Intimate partner violence    Fear of current or ex partner: Not on file    Emotionally abused: Not on file    Physically abused: Not on file    Forced sexual activity: Not on file  Other Topics Concern  . Not on file  Social History Narrative  . Not on file     Allergies  Allergen Reactions  . Adhesive [Tape] Hives    NO PLASTIC tape, please!!     Immunization History  Administered Date(s) Administered  . Influenza,inj,Quad PF,6+ Mos 11/03/2015  . Influenza-Unspecified 11/11/2014    Outpatient Medications Prior to Visit  Medication Sig Dispense Refill  . acetaminophen (TYLENOL) 325 MG tablet Take 2 tablets (650 mg total) by mouth every 6 (six) hours as needed for mild pain (or temp > 100).    Marland Kitchen amLODipine-atorvastatin (CADUET) 5-10 MG tablet Take 1 tablet by mouth daily.    Marland Kitchen gabapentin (NEURONTIN) 100 MG capsule Take 100 mg by mouth 3 (three) times daily as needed (for burning pain).    . hydrochlorothiazide (HYDRODIURIL) 25 MG tablet Take 25 mg by mouth daily.    Marland Kitchen lisinopril (PRINIVIL,ZESTRIL) 40 MG tablet Take 40 mg by mouth daily.     . minocycline (MINOCIN,DYNACIN) 50 MG capsule Take 50 mg by mouth 2 (two) times daily.     . nabumetone (RELAFEN) 750 MG tablet Take 750 mg by mouth 2 (two) times daily.    . meloxicam (MOBIC) 15 MG tablet  Take 15 mg by mouth daily as needed (for tennis elbow).     . tamsulosin (FLOMAX) 0.4 MG CAPS capsule Take 1 capsule (0.4 mg total) by mouth daily after breakfast. (Patient not taking: Reported on 05/24/2018) 30 capsule 0   No facility-administered medications prior to visit.     Review of Systems  Constitutional: Negative for chills, fever, malaise/fatigue and weight loss.  HENT: Positive for congestion and sore throat.   Eyes: Positive for blurred vision. Negative for double vision.  Respiratory: Positive for cough and sputum production. Negative for hemoptysis, shortness of breath and wheezing.   Cardiovascular: Negative for chest pain and leg swelling.  Gastrointestinal: Positive  for heartburn. Negative for constipation, nausea and vomiting.  Genitourinary: Negative.   Musculoskeletal: Negative for joint pain and myalgias.  Skin: Negative for rash.  Neurological: Negative for dizziness, focal weakness and headaches.  Endo/Heme/Allergies: Negative for environmental allergies. Does not bruise/bleed easily.  Psychiatric/Behavioral: Negative.      Objective:   Vitals:   10/24/18 0904  BP: 136/80  Pulse: 79  Temp: (!) 97.3 F (36.3 C)  TempSrc: Temporal  SpO2: 97%  Weight: 291 lb 9.6 oz (132.3 kg)  Height: 5\' 10"  (1.778 m)   97% on   RA BMI Readings from Last 3 Encounters:  10/24/18 41.84 kg/m  11/27/17 41.18 kg/m  10/23/17 39.53 kg/m   Wt Readings from Last 3 Encounters:  10/24/18 291 lb 9.6 oz (132.3 kg)  11/27/17 287 lb (130.2 kg)  10/23/17 275 lb 8 oz (125 kg)    Physical Exam Vitals signs reviewed.  Constitutional:      Appearance: He is obese. He is not ill-appearing or diaphoretic.  HENT:     Head: Normocephalic and atraumatic.     Nose:     Comments: Erythematous nasal mucosa    Mouth/Throat:     Comments: Mallampati 3, mild pharyngeal erythema Eyes:     General: No scleral icterus. Neck:     Musculoskeletal: Neck supple.  Cardiovascular:     Rate  and Rhythm: Normal rate and regular rhythm.     Heart sounds: No murmur.  Pulmonary:     Comments: Breathing comfortably on room air, no tachypnea or conversational dyspnea.  Occasional throat clearing, but no coughing.  Decreased basilar breath sounds, but clear to auscultation bilaterally. Abdominal:     Comments: Obese, soft, nontender  Musculoskeletal:        General: No swelling or deformity.  Lymphadenopathy:     Cervical: No cervical adenopathy.  Skin:    General: Skin is warm and dry.     Findings: No rash.  Neurological:     General: No focal deficit present.     Mental Status: He is alert.     Motor: No weakness.     Coordination: Coordination normal.  Psychiatric:        Mood and Affect: Mood normal.        Behavior: Behavior normal.      CBC    Component Value Date/Time   WBC 7.4 10/22/2017 0949   RBC 3.83 (L) 10/22/2017 0949   HGB 13.0 10/22/2017 0949   HCT 35.9 (L) 10/22/2017 0949   PLT 199 10/22/2017 0949   MCV 93.7 10/22/2017 0949   MCH 33.9 10/22/2017 0949   MCHC 36.2 (H) 10/22/2017 0949   RDW 15.9 (H) 10/22/2017 0949   LYMPHSABS 1.6 10/22/2017 0400   MONOABS 0.7 10/22/2017 0400   EOSABS 0.4 10/22/2017 0400   BASOSABS 0.1 10/22/2017 0400      Chest Imaging- films reviewed: CT abdomen pelvis 0/99/8338- lung slices reviewed-no nodules or opacities   Pulmonary Functions Testing Results: No flowsheet data found.      Assessment & Plan:     ICD-10-CM   1. Chronic cough  R05 fluticasone (FLONASE) 50 MCG/ACT nasal spray  2. 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  RTC in 3 months.    Current Outpatient Medications:  .  acetaminophen (TYLENOL) 325 MG tablet, Take 2 tablets (650 mg total) by mouth every 6 (six) hours as needed for mild pain (or temp > 100)., Disp: , Rfl:  .  amLODipine-atorvastatin (CADUET) 5-10 MG tablet, Take 1 tablet by mouth daily., Disp: , Rfl:  .  gabapentin (NEURONTIN) 100 MG capsule, Take 100 mg by mouth 3 (three) times daily as needed (for burning pain)., Disp: , Rfl:  .  hydrochlorothiazide (HYDRODIURIL) 25 MG tablet, Take 25 mg by mouth daily., Disp: , Rfl:  .  lisinopril (PRINIVIL,ZESTRIL) 40 MG tablet, Take 40 mg by mouth daily. , Disp: , Rfl:  .  minocycline (MINOCIN,DYNACIN) 50 MG capsule, Take 50 mg by mouth 2 (two) times daily. , Disp: , Rfl:  .  nabumetone (RELAFEN) 750 MG tablet, Take 750 mg by mouth 2 (two) times daily., Disp: , Rfl:  .  fluticasone (FLONASE) 50 MCG/ACT nasal spray, Place 2 sprays into both nostrils daily., Disp: 16 g, Rfl: 2   Steffanie DunnLaura P Jonatan Wilsey, DO [facility] 10/24/2018 9:43 AM

## 2019-01-05 ENCOUNTER — Other Ambulatory Visit: Payer: Self-pay | Admitting: Critical Care Medicine

## 2019-01-05 DIAGNOSIS — J3089 Other allergic rhinitis: Secondary | ICD-10-CM

## 2019-01-05 DIAGNOSIS — R053 Chronic cough: Secondary | ICD-10-CM

## 2019-01-05 DIAGNOSIS — R05 Cough: Secondary | ICD-10-CM

## 2019-06-10 ENCOUNTER — Emergency Department (HOSPITAL_COMMUNITY)
Admission: EM | Admit: 2019-06-10 | Discharge: 2019-06-10 | Disposition: A | Discharge status: Home / Self Care | Payer: BC Managed Care – PPO | Attending: Emergency Medicine | Admitting: Emergency Medicine

## 2019-06-10 ENCOUNTER — Encounter (HOSPITAL_COMMUNITY): Payer: Self-pay

## 2019-06-10 ENCOUNTER — Other Ambulatory Visit: Payer: Self-pay

## 2019-06-10 DIAGNOSIS — R631 Polydipsia: Secondary | ICD-10-CM | POA: Insufficient documentation

## 2019-06-10 DIAGNOSIS — R350 Frequency of micturition: Secondary | ICD-10-CM | POA: Diagnosis not present

## 2019-06-10 DIAGNOSIS — I10 Essential (primary) hypertension: Secondary | ICD-10-CM | POA: Diagnosis not present

## 2019-06-10 DIAGNOSIS — M545 Low back pain: Secondary | ICD-10-CM | POA: Diagnosis not present

## 2019-06-10 DIAGNOSIS — R739 Hyperglycemia, unspecified: Secondary | ICD-10-CM | POA: Diagnosis present

## 2019-06-10 DIAGNOSIS — Z79899 Other long term (current) drug therapy: Secondary | ICD-10-CM | POA: Insufficient documentation

## 2019-06-10 LAB — BETA-HYDROXYBUTYRIC ACID: Beta-Hydroxybutyric Acid: 2.22 mmol/L — ABNORMAL HIGH (ref 0.05–0.27)

## 2019-06-10 LAB — BASIC METABOLIC PANEL
Anion gap: 13 (ref 5–15)
BUN: 18 mg/dL (ref 6–20)
CO2: 24 mmol/L (ref 22–32)
Calcium: 9.5 mg/dL (ref 8.9–10.3)
Chloride: 95 mmol/L — ABNORMAL LOW (ref 98–111)
Creatinine, Ser: 1.21 mg/dL (ref 0.61–1.24)
GFR calc Af Amer: >60 mL/min (ref >60)
GFR calc non Af Amer: >60 mL/min (ref >60)
Glucose, Bld: 552 mg/dL (ref 70–99)
Potassium: 4.9 mmol/L (ref 3.5–5.1)
Sodium: 132 mmol/L — ABNORMAL LOW (ref 135–145)

## 2019-06-10 LAB — CBC
HCT: 41.2 % (ref 39.0–52.0)
Hemoglobin: 14.3 g/dL (ref 13.0–17.0)
MCH: 30.6 pg (ref 26.0–34.0)
MCHC: 34.7 g/dL (ref 30.0–36.0)
MCV: 88 fL (ref 80.0–100.0)
Platelets: 198 10*3/uL (ref 150–400)
RBC: 4.68 MIL/uL (ref 4.22–5.81)
RDW: 13.7 % (ref 11.5–15.5)
WBC: 5.3 10*3/uL (ref 4.0–10.5)
nRBC: 0 % (ref 0.0–0.2)

## 2019-06-10 LAB — URINALYSIS, ROUTINE W REFLEX MICROSCOPIC
Bacteria, UA: NONE SEEN
Bilirubin Urine: NEGATIVE
Glucose, UA: >=500 mg/dL — AB
Hgb urine dipstick: NEGATIVE
Ketones, ur: 20 mg/dL — AB
Leukocytes,Ua: NEGATIVE
Nitrite: NEGATIVE
Protein, ur: NEGATIVE mg/dL
Specific Gravity, Urine: 1.029 (ref 1.005–1.030)
pH: 6 (ref 5.0–8.0)

## 2019-06-10 LAB — CBG MONITORING, ED
Glucose-Capillary: 562 mg/dL (ref 70–99)
Glucose-Capillary: 332 mg/dL — ABNORMAL HIGH (ref 70–99)
Glucose-Capillary: 289 mg/dL — ABNORMAL HIGH (ref 70–99)

## 2019-06-10 LAB — LACTIC ACID, PLASMA: Lactic Acid, Venous: 1.4 mmol/L (ref 0.5–1.9)

## 2019-06-10 LAB — HEMOGLOBIN A1C
Hgb A1c MFr Bld: 17.1 % — ABNORMAL HIGH (ref 4.8–5.6)
Mean Plasma Glucose: 444.07 mg/dL

## 2019-06-10 LAB — OSMOLALITY: Osmolality: 310 mOsm/kg — ABNORMAL HIGH (ref 275–295)

## 2019-06-10 MED ORDER — SODIUM CHLORIDE 0.9 % IV BOLUS
1000.0000 mL | Freq: Once | INTRAVENOUS | Status: AC
  Administered 2019-06-10: 1000 mL via INTRAVENOUS

## 2019-06-10 MED ORDER — METFORMIN HCL 500 MG PO TABS
500.0000 mg | ORAL_TABLET | Freq: Once | ORAL | Status: AC
  Administered 2019-06-10: 500 mg via ORAL
  Filled 2019-06-10: qty 1

## 2019-06-10 MED ORDER — METFORMIN HCL 500 MG PO TABS
500.0000 mg | ORAL_TABLET | Freq: Two times a day (BID) | ORAL | 1 refills | Status: DC
Start: 2019-06-10 — End: 2021-12-30

## 2019-06-10 MED ORDER — LACTATED RINGERS IV BOLUS
1000.0000 mL | Freq: Once | INTRAVENOUS | Status: AC
  Administered 2019-06-10: 1000 mL via INTRAVENOUS

## 2019-06-10 NOTE — Discharge Instructions (Addendum)
Per our discussion, I am starting you on a new medication called Metformin.  You are going to start taking this medication for your diabetes mellitus.  This medication is known to cause diarrhea.  Please continue taking it unless you experience diarrhea that is unmanageable.  This will typically subside in a week or 2.  Please follow-up with your primary doctor tomorrow to discuss this visit.  It is important that your primary doctor know about your symptoms as well as your elevated blood sugar.  If your symptoms worsen please do not hesitate to return to the emergency department.  It was a pleasure to meet you.

## 2019-06-10 NOTE — ED Provider Notes (Signed)
[facility] EMERGENCY DEPARTMENT Provider Note   CSN: [id number] Arrival date & time: 06/10/19  2956     History Chief Complaint  Patient presents with  . Hyperglycemia  . Back Pain    Bryan [name] is a 49 y.o. male.  HPI HPI Comments: Bryan [name] is a 49 y.o. male who presents to the Emergency Department complaining of elevated blood sugar.  Patient has a history of degenerative disc disease and was having lab work performed yesterday by his rheumatologist.  His low back pain is a chronic issue that has been going on for about 2 years now.  Per records, he is gotten more than 20 injections in his lower back including steroids, nerve blocks, RFA.  He denies his low back pain is acutely worsened.  He was contacted this morning and told that his blood sugar yesterday was 700.  He then came to the emergency department for evaluation.  His CBG upon arrival was 562.  Patient reports for the last "month and a half" he has been experiencing increased polyuria and polydipsia.  He additionally reports increased constant blurry vision.  Patient reports about 50 pounds of involuntary weight loss in the past year.  He denies any known history of diabetes.  He does not drink alcohol or take any illegal drugs.  He denies fevers, chills, URI symptoms, chest pain, shortness of breath, abdominal pain, nausea, vomiting, diarrhea, constipation, dysuria, hematuria, syncope, dizziness.     Past Medical History:  Diagnosis Date  . History of diverticulitis of colon 07/30/2014   07-30-2014  acute diverticulitis;  08-21-2014 recurrent diverticulitis w/ microperforation, 08-26-2014 s/p partial colectomy (post-op intra-abdominal abscesses and complication wound healing)  . Hypertension   . Right ureteral stone     Patient Active Problem List   Diagnosis Date Noted  . Drug overdose 10/22/2017  . Incisional hernia without obstruction or gangrene 11/02/2015  .  Incisional hernia 11/02/2015  . Ileostomy in place [facility]) 12/31/2014  . Diverticulitis 09/02/2014  . Diverticulitis of colon with perforation 08/21/2014  . Acute diverticulitis 07/30/2014  . Traumatic hematoma of thigh 03/19/2012    Past Surgical History:  Procedure Laterality Date  . APPENDECTOMY  1981  . CYSTOSCOPY/URETEROSCOPY/HOLMIUM LASER/STENT PLACEMENT Right 06/08/2017   Procedure: CYSTOSCOPY/RETROGRADE/URETEROSCOPY/HOLMIUM LASER/STENT PLACEMENT;  Surgeon: Rene Paci, MD;  Location: [facility];  Service: Urology;  Laterality: Right;  ONLY NEEDS 45 MIN  . ILEOSTOMY CLOSURE N/A 12/31/2014   Procedure: ILEOSTOMY CLOSURE;  Surgeon: Harriette Bouillon, MD;  Location: [facility] OR;  Service: General;  Laterality: N/A;  . INCISIONAL HERNIA REPAIR N/A 11/02/2015   Procedure: LAPAROSCOPIC INCISIONAL HERNIA;  Surgeon: Harriette Bouillon, MD;  Location: [facility] OR;  Service: General;  Laterality: N/A;  . INSERTION OF MESH N/A 11/02/2015   Procedure: INSERTION OF MESH;  Surgeon: Harriette Bouillon, MD;  Location: [facility] OR;  Service: General;  Laterality: N/A;  . LAPAROSCOPIC PARTIAL COLECTOMY N/A 08/26/2014   Procedure: LAPAROSCOPIC ASSISTED PARTIAL COLECTOMY WITH OSTOMY;  Surgeon: Harriette Bouillon, MD;  Location: [facility] OR;  Service: General;  Laterality: N/A;     Family History  Problem Relation Age of Onset  . Cancer Father        lung  . Heart disease Father   . Diabetes Maternal Grandmother     Social History   Tobacco Use  . Smoking status: Never Smoker  . Smokeless tobacco: Never Used  Substance Use Topics  . Alcohol use: No  . Drug use: No  Home Medications Prior to Admission medications   Medication Sig Start Date End Date Taking? Authorizing Provider  acetaminophen (TYLENOL) 325 MG tablet Take 2 tablets (650 mg total) by mouth every 6 (six) hours as needed for mild pain (or temp > 100). 08/31/14   Nonie Hoyer, PA-C  amLODipine-atorvastatin (CADUET) 5-10 MG tablet Take  1 tablet by mouth daily.    [provider]  fluticasone (FLONASE) 50 MCG/ACT nasal spray SPRAY 2 SPRAYS INTO EACH NOSTRIL EVERY DAY 01/06/19   Karie Fetch P, DO  gabapentin (NEURONTIN) 100 MG capsule Take 100 mg by mouth 3 (three) times daily as needed (for burning pain).    [provider]  hydrochlorothiazide (HYDRODIURIL) 25 MG tablet Take 25 mg by mouth daily.    [provider]  lisinopril (PRINIVIL,ZESTRIL) 40 MG tablet Take 40 mg by mouth daily.  01/23/17   [provider]  minocycline (MINOCIN,DYNACIN) 50 MG capsule Take 50 mg by mouth 2 (two) times daily.     [provider]  nabumetone (RELAFEN) 750 MG tablet Take 750 mg by mouth 2 (two) times daily.    [provider]    Allergies    Adhesive [tape]  Review of Systems   Review of Systems  All other systems reviewed and are negative. Ten systems reviewed and are negative for acute change, except as noted in the HPI.   Physical Exam Updated Vital Signs BP (!) 132/106   Pulse 83   Temp 98.7 F (37.1 C) (Oral)   Resp 16   SpO2 100%   Physical Exam Vitals and nursing note reviewed.  Constitutional:      General: He is not in acute distress.    Appearance: Normal appearance. He is obese. He is not ill-appearing, toxic-appearing or diaphoretic.  HENT:     Head: Normocephalic and atraumatic.     Right Ear: External ear normal.     Left Ear: External ear normal.     Nose: Nose normal.     Mouth/Throat:     Mouth: Mucous membranes are dry.     Pharynx: Oropharynx is clear. No oropharyngeal exudate or posterior oropharyngeal erythema.  Eyes:     General: No scleral icterus.       Right eye: No discharge.        Left eye: No discharge.     Extraocular Movements: Extraocular movements intact.     Conjunctiva/sclera: Conjunctivae normal.     Pupils: Pupils are equal, round, and reactive to light.  Cardiovascular:     Rate and Rhythm: Normal rate and regular rhythm.      Pulses: Normal pulses.     Heart sounds: Normal heart sounds. No murmur. No friction rub. No gallop.   Pulmonary:     Effort: Pulmonary effort is normal. No respiratory distress.     Breath sounds: Normal breath sounds. No stridor. No wheezing, rhonchi or rales.  Chest:     Chest wall: No tenderness.  Abdominal:     General: Abdomen is flat.     Palpations: Abdomen is soft.     Tenderness: There is no abdominal tenderness.  Musculoskeletal:        General: Tenderness present. Normal range of motion.     Cervical back: Normal range of motion and neck supple. No tenderness.     Right lower leg: No edema.     Left lower leg: No edema.     Comments: No edema appreciated in the bilateral lower  extremities.  Palpable pedal pulses.  Diffuse midline lumbar back pain. No edema or erythema. No step off or crepitus.  Skin:    General: Skin is warm and dry.  Neurological:     General: No focal deficit present.     Mental Status: He is alert and oriented to person, place, and time.     Comments: Distal sensation intact in all 4 extremities.  Negative pronator drift.  Extraocular movements intact.  Patient is oriented to person, place, time.  Finger-to-nose intact bilaterally with no visible signs of ataxia.  Strength is 5 out of 5 in the bilateral upper and lower extremities.  Patient able to move all 4 extremities spontaneously without difficulty.  Psychiatric:        Mood and Affect: Mood normal.        Behavior: Behavior normal.    ED Results / Procedures / Treatments   Labs (all labs ordered are listed, but only abnormal results are displayed) Labs Reviewed  BASIC METABOLIC PANEL - Abnormal; Notable for the following components:      Result Value   Sodium 132 (*)    Chloride 95 (*)    Glucose, Bld 552 (*)    All other components within normal limits  URINALYSIS, ROUTINE W REFLEX MICROSCOPIC - Abnormal; Notable for the following components:   Color, Urine STRAW (*)    Glucose, UA  >=500 (*)    Ketones, ur 20 (*)    All other components within normal limits  BETA-HYDROXYBUTYRIC ACID - Abnormal; Notable for the following components:   Beta-Hydroxybutyric Acid 2.22 (*)    All other components within normal limits  OSMOLALITY - Abnormal; Notable for the following components:   Osmolality 310 (*)    All other components within normal limits  HEMOGLOBIN A1C - Abnormal; Notable for the following components:   Hgb A1c MFr Bld 17.1 (*)    All other components within normal limits  CBG MONITORING, ED - Abnormal; Notable for the following components:   Glucose-Capillary 546 (*)    All other components within normal limits  CBG MONITORING, ED - Abnormal; Notable for the following components:   Glucose-Capillary 562 (*)    All other components within normal limits  CBG MONITORING, ED - Abnormal; Notable for the following components:   Glucose-Capillary 332 (*)    All other components within normal limits  CBG MONITORING, ED - Abnormal; Notable for the following components:   Glucose-Capillary 289 (*)    All other components within normal limits  CBC  LACTIC ACID, PLASMA  LACTIC ACID, PLASMA   EKG EKG Interpretation  Date/Time:  Tuesday Jun 10 2019 13:38:04 EDT Ventricular Rate:  84 PR Interval:    QRS Duration: 70 QT Interval:  368 QTC Calculation: 435 R Axis:   42 Text Interpretation: Sinus rhythm Low voltage, precordial leads Abnormal R-wave progression, early transition since last tracing no significant change Confirmed by Daleen Bo [phone]) on 06/10/2019 4:07:51 PM  Radiology No results found.  Procedures Procedures (including critical care time)  Medications Ordered in ED Medications  metFORMIN (GLUCOPHAGE) tablet 500 mg (has no administration in time range)  sodium chloride 0.9 % bolus 1,000 mL (0 mLs Intravenous Stopped 06/10/19 1425)  lactated ringers bolus 1,000 mL (1,000 mLs Intravenous New Bag/Given 06/10/19 1425)   ED Course  I have  reviewed the triage vital signs and the nursing notes.  Pertinent labs & imaging results that were available during my care of the patient were reviewed  by me and considered in my medical decision making (see chart for details).    MDM Rules/Calculators/A&P                      Patient is a 49 year old male that presents to the emergency department due to hyperglycemia.  He was getting basic labs performed yesterday for his chronic back pain.  He was instructed to come to the emergency department today because his blood sugar yesterday was 700.  On arrival his blood sugar was 552.  No known history of diabetes mellitus.  Patient notes that he has been losing a significant amount of weight over the last 6 to 12 months.  He notes about a 50 pound weight loss.  He also has been experiencing blurry vision for last 4 to 6 weeks.  He has not discussed this with his primary care provider.  Basic labs were obtained showing a corrected sodium of 139.  Beta hydroxybutyrate was elevated at 2.22.  Osmolality of 310.  Patient has no neurological complaints and his neurological exam is benign.  No abdominal pain.  No nausea.  No vomiting.  No anion gap.  Doubt DKA or HHS.  Negative lactic acid.   Patient given a 1 L fluid bolus of normal saline.  Repeat CBG at 332.  Patient given another fluid bolus and repeat CBG now 289.  I discussed this with the patient.  I am going to give him a dose of Metformin here in the emergency department.  I am going to prescribe him Metformin.  I recommended that he call his primary care provider first thing tomorrow morning to discuss this visit as well as his symptoms.  He understands that he has a clear diagnosis of diabetes and needs to be further evaluated by his primary care provider to come up with a proper regimen to manage his blood sugar.  A1c obtained here in the emergency department which was 17.1.  Patient was given strict return precautions and knows to return to the  emergency department if he develops new or worsening symptoms.  His questions were answered and he was amicable to time of discharge.  His vital signs are stable.  Patient discharged to home/self care.  Condition at discharge: Stable  Note: Portions of this report may have been transcribed using voice recognition software. Every effort was made to ensure accuracy; however, inadvertent computerized transcription errors may be present.    Final Clinical Impression(s) / ED Diagnoses Final diagnoses:  Hyperglycemia    Rx / DC Orders ED Discharge Orders         Ordered    metFORMIN (GLUCOPHAGE) 500 MG tablet  2 times daily with meals     06/10/19 1614           Placido Sou, PA-C 06/10/19 1618    Mancel Bale, MD 06/10/19 1859

## 2019-06-10 NOTE — ED Triage Notes (Signed)
Pt sent by PCP for further evaluation of hyperglycemia (700). Pt getting workup done at [facility] for lower back pain, blood work was done and resulted Glucose of 700, pt denies known hx of Diabetes. CBG 546 in triage. Denies n.v, does endorse increased thirst and freq urination

## 2019-06-12 LAB — CBG MONITORING, ED: Glucose-Capillary: 546 mg/dL (ref 70–99)

## 2019-06-29 ENCOUNTER — Other Ambulatory Visit: Payer: Self-pay | Admitting: Dermatology

## 2019-07-22 ENCOUNTER — Other Ambulatory Visit: Payer: Self-pay | Admitting: Dermatology

## 2019-11-04 ENCOUNTER — Other Ambulatory Visit: Payer: Self-pay | Admitting: Dermatology

## 2019-12-10 ENCOUNTER — Other Ambulatory Visit: Payer: Self-pay | Admitting: Dermatology

## 2020-08-28 ENCOUNTER — Other Ambulatory Visit: Payer: Self-pay | Admitting: Dermatology

## 2021-12-28 ENCOUNTER — Encounter (HOSPITAL_COMMUNITY): Payer: Self-pay

## 2021-12-28 ENCOUNTER — Emergency Department (HOSPITAL_COMMUNITY): Payer: BLUE CROSS/BLUE SHIELD

## 2021-12-28 ENCOUNTER — Other Ambulatory Visit: Payer: Self-pay

## 2021-12-28 ENCOUNTER — Inpatient Hospital Stay (HOSPITAL_COMMUNITY)
Admission: EM | Admit: 2021-12-28 | Discharge: 2021-12-30 | DRG: 378 | Disposition: A | Discharge status: Home / Self Care | Payer: BLUE CROSS/BLUE SHIELD | Attending: Internal Medicine | Admitting: Internal Medicine

## 2021-12-28 DIAGNOSIS — K264 Chronic or unspecified duodenal ulcer with hemorrhage: Principal | ICD-10-CM | POA: Diagnosis present

## 2021-12-28 DIAGNOSIS — Z8249 Family history of ischemic heart disease and other diseases of the circulatory system: Secondary | ICD-10-CM

## 2021-12-28 DIAGNOSIS — R296 Repeated falls: Secondary | ICD-10-CM | POA: Diagnosis present

## 2021-12-28 DIAGNOSIS — T39395A Adverse effect of other nonsteroidal anti-inflammatory drugs [NSAID], initial encounter: Secondary | ICD-10-CM | POA: Diagnosis present

## 2021-12-28 DIAGNOSIS — S0101XA Laceration without foreign body of scalp, initial encounter: Secondary | ICD-10-CM | POA: Diagnosis present

## 2021-12-28 DIAGNOSIS — W1830XA Fall on same level, unspecified, initial encounter: Secondary | ICD-10-CM | POA: Diagnosis present

## 2021-12-28 DIAGNOSIS — Z9049 Acquired absence of other specified parts of digestive tract: Secondary | ICD-10-CM

## 2021-12-28 DIAGNOSIS — Z6841 Body Mass Index (BMI) 40.0 and over, adult: Secondary | ICD-10-CM

## 2021-12-28 DIAGNOSIS — G8929 Other chronic pain: Secondary | ICD-10-CM | POA: Diagnosis present

## 2021-12-28 DIAGNOSIS — E1140 Type 2 diabetes mellitus with diabetic neuropathy, unspecified: Secondary | ICD-10-CM | POA: Diagnosis present

## 2021-12-28 DIAGNOSIS — Z833 Family history of diabetes mellitus: Secondary | ICD-10-CM

## 2021-12-28 DIAGNOSIS — K921 Melena: Principal | ICD-10-CM

## 2021-12-28 DIAGNOSIS — N179 Acute kidney failure, unspecified: Secondary | ICD-10-CM | POA: Diagnosis present

## 2021-12-28 DIAGNOSIS — D62 Acute posthemorrhagic anemia: Secondary | ICD-10-CM | POA: Diagnosis present

## 2021-12-28 DIAGNOSIS — Y92009 Unspecified place in unspecified non-institutional (private) residence as the place of occurrence of the external cause: Secondary | ICD-10-CM

## 2021-12-28 DIAGNOSIS — K254 Chronic or unspecified gastric ulcer with hemorrhage: Secondary | ICD-10-CM | POA: Diagnosis present

## 2021-12-28 DIAGNOSIS — K209 Esophagitis, unspecified without bleeding: Secondary | ICD-10-CM | POA: Diagnosis present

## 2021-12-28 DIAGNOSIS — Z809 Family history of malignant neoplasm, unspecified: Secondary | ICD-10-CM

## 2021-12-28 DIAGNOSIS — M545 Low back pain, unspecified: Secondary | ICD-10-CM | POA: Diagnosis present

## 2021-12-28 DIAGNOSIS — I10 Essential (primary) hypertension: Secondary | ICD-10-CM | POA: Diagnosis present

## 2021-12-28 DIAGNOSIS — Z801 Family history of malignant neoplasm of trachea, bronchus and lung: Secondary | ICD-10-CM

## 2021-12-28 DIAGNOSIS — Z91048 Other nonmedicinal substance allergy status: Secondary | ICD-10-CM

## 2021-12-28 DIAGNOSIS — R739 Hyperglycemia, unspecified: Secondary | ICD-10-CM

## 2021-12-28 DIAGNOSIS — W19XXXA Unspecified fall, initial encounter: Secondary | ICD-10-CM | POA: Diagnosis present

## 2021-12-28 DIAGNOSIS — E1165 Type 2 diabetes mellitus with hyperglycemia: Secondary | ICD-10-CM | POA: Diagnosis present

## 2021-12-28 DIAGNOSIS — K2091 Esophagitis, unspecified with bleeding: Secondary | ICD-10-CM | POA: Diagnosis present

## 2021-12-28 DIAGNOSIS — R531 Weakness: Secondary | ICD-10-CM | POA: Diagnosis not present

## 2021-12-28 DIAGNOSIS — Z7984 Long term (current) use of oral hypoglycemic drugs: Secondary | ICD-10-CM

## 2021-12-28 DIAGNOSIS — Z79899 Other long term (current) drug therapy: Secondary | ICD-10-CM

## 2021-12-28 HISTORY — DX: Type 2 diabetes mellitus without complications: E11.9

## 2021-12-28 LAB — BLOOD GAS, VENOUS
Acid-Base Excess: 0 mmol/L (ref 0.0–2.0)
Bicarbonate: 25.9 mmol/L (ref 20.0–28.0)
O2 Saturation: 64.5 %
Patient temperature: 37
pCO2, Ven: 47 mmHg (ref 44–60)
pH, Ven: 7.35 (ref 7.25–7.43)
pO2, Ven: 37 mmHg (ref 32–45)

## 2021-12-28 LAB — BASIC METABOLIC PANEL
Anion gap: 8 (ref 5–15)
BUN: 38 mg/dL — ABNORMAL HIGH (ref 6–20)
CO2: 24 mmol/L (ref 22–32)
Calcium: 8.7 mg/dL — ABNORMAL LOW (ref 8.9–10.3)
Chloride: 101 mmol/L (ref 98–111)
Creatinine, Ser: 1.51 mg/dL — ABNORMAL HIGH (ref 0.61–1.24)
GFR, Estimated: 55 mL/min — ABNORMAL LOW (ref >60)
Glucose, Bld: 482 mg/dL — ABNORMAL HIGH (ref 70–99)
Potassium: 4.7 mmol/L (ref 3.5–5.1)
Sodium: 133 mmol/L — ABNORMAL LOW (ref 135–145)

## 2021-12-28 LAB — URINALYSIS, ROUTINE W REFLEX MICROSCOPIC
Bilirubin Urine: NEGATIVE
Glucose, UA: >=500 mg/dL — AB
Ketones, ur: NEGATIVE mg/dL
Leukocytes,Ua: NEGATIVE
Nitrite: NEGATIVE
Protein, ur: NEGATIVE mg/dL
Specific Gravity, Urine: 1.022 (ref 1.005–1.030)
pH: 5 (ref 5.0–8.0)

## 2021-12-28 LAB — CBC WITH DIFFERENTIAL/PLATELET
Abs Immature Granulocytes: 0.09 10*3/uL — ABNORMAL HIGH (ref 0.00–0.07)
Basophils Absolute: 0.1 10*3/uL (ref 0.0–0.1)
Basophils Relative: 1 %
Eosinophils Absolute: 0.2 10*3/uL (ref 0.0–0.5)
Eosinophils Relative: 3 %
HCT: 26.3 % — ABNORMAL LOW (ref 39.0–52.0)
Hemoglobin: 8.9 g/dL — ABNORMAL LOW (ref 13.0–17.0)
Immature Granulocytes: 1 %
Lymphocytes Relative: 17 %
Lymphs Abs: 1.3 10*3/uL (ref 0.7–4.0)
MCH: 31.6 pg (ref 26.0–34.0)
MCHC: 33.8 g/dL (ref 30.0–36.0)
MCV: 93.3 fL (ref 80.0–100.0)
Monocytes Absolute: 0.4 10*3/uL (ref 0.1–1.0)
Monocytes Relative: 5 %
Neutro Abs: 5.5 10*3/uL (ref 1.7–7.7)
Neutrophils Relative %: 73 %
Platelets: 217 10*3/uL (ref 150–400)
RBC: 2.82 MIL/uL — ABNORMAL LOW (ref 4.22–5.81)
RDW: 16.6 % — ABNORMAL HIGH (ref 11.5–15.5)
WBC: 7.5 10*3/uL (ref 4.0–10.5)
nRBC: 0.3 % — ABNORMAL HIGH (ref 0.0–0.2)

## 2021-12-28 LAB — POC OCCULT BLOOD, ED: Fecal Occult Bld: POSITIVE — AB

## 2021-12-28 LAB — CBG MONITORING, ED: Glucose-Capillary: 399 mg/dL — ABNORMAL HIGH (ref 70–99)

## 2021-12-28 LAB — BETA-HYDROXYBUTYRIC ACID: Beta-Hydroxybutyric Acid: 0.89 mmol/L — ABNORMAL HIGH (ref 0.05–0.27)

## 2021-12-28 MED ORDER — INSULIN ASPART 100 UNIT/ML IJ SOLN
10.0000 [IU] | Freq: Once | INTRAMUSCULAR | Status: AC
  Administered 2021-12-28: 10 [IU] via SUBCUTANEOUS
  Filled 2021-12-28: qty 0.1

## 2021-12-28 MED ORDER — ONDANSETRON HCL 4 MG PO TABS: 4.0000 mg | ORAL_TABLET | Freq: Four times a day (QID) | ORAL | Status: DC | PRN

## 2021-12-28 MED ORDER — PIOGLITAZONE HCL 30 MG PO TABS
30.0000 mg | ORAL_TABLET | Freq: Every evening | ORAL | Status: DC
  Filled 2021-12-28: qty 1

## 2021-12-28 MED ORDER — SODIUM CHLORIDE 0.9 % IV SOLN: INTRAVENOUS | Status: DC

## 2021-12-28 MED ORDER — GABAPENTIN 300 MG PO CAPS
300.0000 mg | ORAL_CAPSULE | Freq: Every morning | ORAL | Status: DC
  Administered 2021-12-29 – 2021-12-30 (×2): 300 mg via ORAL
  Filled 2021-12-28 (×2): qty 1

## 2021-12-28 MED ORDER — HYDROCODONE-ACETAMINOPHEN 5-325 MG PO TABS
1.0000 | ORAL_TABLET | Freq: Four times a day (QID) | ORAL | Status: DC | PRN
  Administered 2021-12-28 (×2): 1 via ORAL
  Administered 2021-12-29: 2 via ORAL
  Filled 2021-12-28: qty 2
  Filled 2021-12-28 (×2): qty 1

## 2021-12-28 MED ORDER — GABAPENTIN 300 MG PO CAPS
600.0000 mg | ORAL_CAPSULE | Freq: Every day | ORAL | Status: DC
  Administered 2021-12-28 – 2021-12-29 (×2): 600 mg via ORAL
  Filled 2021-12-28 (×2): qty 2

## 2021-12-28 MED ORDER — PANTOPRAZOLE SODIUM 40 MG IV SOLR
40.0000 mg | Freq: Two times a day (BID) | INTRAVENOUS | Status: DC
  Administered 2021-12-28 – 2021-12-29 (×2): 40 mg via INTRAVENOUS
  Filled 2021-12-28 (×2): qty 10

## 2021-12-28 MED ORDER — ONDANSETRON HCL 4 MG/2ML IJ SOLN: 4.0000 mg | Freq: Four times a day (QID) | INTRAMUSCULAR | Status: DC | PRN

## 2021-12-28 MED ORDER — METFORMIN HCL 500 MG PO TABS
1000.0000 mg | ORAL_TABLET | Freq: Two times a day (BID) | ORAL | Status: DC
  Administered 2021-12-28: 1000 mg via ORAL
  Filled 2021-12-28: qty 2

## 2021-12-28 MED ORDER — INSULIN ASPART 100 UNIT/ML IJ SOLN
0.0000 [IU] | INTRAMUSCULAR | Status: DC
  Administered 2021-12-28: 11 [IU] via SUBCUTANEOUS
  Administered 2021-12-29: 5 [IU] via SUBCUTANEOUS
  Administered 2021-12-29: 8 [IU] via SUBCUTANEOUS
  Administered 2021-12-29 – 2021-12-30 (×5): 5 [IU] via SUBCUTANEOUS
  Administered 2021-12-30: 8 [IU] via SUBCUTANEOUS
  Administered 2021-12-30: 5 [IU] via SUBCUTANEOUS
  Filled 2021-12-28: qty 0.15

## 2021-12-28 MED ORDER — GLIPIZIDE 10 MG PO TABS
10.0000 mg | ORAL_TABLET | Freq: Two times a day (BID) | ORAL | Status: DC
  Filled 2021-12-28: qty 1

## 2021-12-28 MED ORDER — HYDROMORPHONE HCL 1 MG/ML IJ SOLN
0.5000 mg | Freq: Once | INTRAMUSCULAR | Status: AC
  Administered 2021-12-28: 0.5 mg via INTRAVENOUS
  Filled 2021-12-28: qty 1

## 2021-12-28 MED ORDER — CARVEDILOL 3.125 MG PO TABS
6.2500 mg | ORAL_TABLET | Freq: Two times a day (BID) | ORAL | Status: DC
  Administered 2021-12-28: 6.25 mg via ORAL
  Filled 2021-12-28: qty 2

## 2021-12-28 MED ORDER — LACTATED RINGERS IV BOLUS
1000.0000 mL | INTRAVENOUS | Status: AC
  Administered 2021-12-28 (×2): 1000 mL via INTRAVENOUS

## 2021-12-28 NOTE — Assessment & Plan Note (Signed)
Patient presents to the ER for evaluation of generalized weakness and frequent falls. Noted to have anemia concerning for acute blood loss, Hb 14.3 >> 8.9 Stools are heme positive Obtain serum iron levels Place patient on fall precautions Consult GI for further evaluation

## 2021-12-28 NOTE — ED Provider Notes (Signed)
[facility]-EMERGENCY DEPT Provider Note   CSN: [id number] Arrival date & time: 12/28/21  1500     History  Chief Complaint  Patient presents with   Bryan [name] is a 51 y.o. male present emergency department with concern for a fall and striking his head.  Patient reports that he got up and fell around 1:00, struck the top of his head on a dresser.  He denies feeling lightheaded but says that his legs "gave out on him".  He experienced or suffered a fracture to his right ankle several weeks ago before Thanksgiving and now wears a cam boot.  Patient reports he feels her blood sugars are higher than normal.  He says he was on a course of steroids "about a month ago" but nothing more recent.  He is a type II diabetic on metformin.  He is not taking insulin.  EMS reported that his blood sugar was in the 500s when they checked.  Patient says he does drink a lot of water but feels thirsty and is urinated a lot.  HPI     Home Medications Prior to Admission medications   Medication Sig Start Date End Date Taking? Authorizing Provider  acetaminophen (TYLENOL) 325 MG tablet Take 2 tablets (650 mg total) by mouth every 6 (six) hours as needed for mild pain (or temp > 100). Patient taking differently: Take 650 mg by mouth every 6 (six) hours as needed for mild pain, fever or headache. 08/31/14  Yes Jorje Guild N, PA-C  albuterol (VENTOLIN HFA) 108 (90 Base) MCG/ACT inhaler Inhale 2 puffs into the lungs every 6 (six) hours as needed for wheezing or shortness of breath.   Yes [provider]  amLODipine (NORVASC) 10 MG tablet Take 10 mg by mouth daily.   Yes [provider]  carvedilol (COREG) 12.5 MG tablet Take 12.5 mg by mouth in the morning and at bedtime.   Yes [provider]  cyclobenzaprine (FLEXERIL) 10 MG tablet Take 10 mg by mouth 3 (three) times daily.   Yes [provider]  fluticasone (FLONASE) 50 MCG/ACT nasal  spray SPRAY 2 SPRAYS INTO EACH NOSTRIL EVERY DAY Patient taking differently: Place 2 sprays into both nostrils daily as needed for allergies or rhinitis. 01/06/19  Yes Karie Fetch P, DO  gabapentin (NEURONTIN) 300 MG capsule Take 300 mg by mouth in the morning.   Yes [provider]  gabapentin (NEURONTIN) 600 MG tablet Take 600 mg by mouth at bedtime. 06/01/19  Yes [provider]  glipiZIDE (GLUCOTROL XL) 10 MG 24 hr tablet Take 10 mg by mouth 2 (two) times daily with a meal.   Yes [provider]  HYDROcodone-acetaminophen (NORCO/VICODIN) 5-325 MG tablet Take 1 tablet by mouth in the morning and at bedtime.   Yes [provider]  ibuprofen (ADVIL) 800 MG tablet Take 800 mg by mouth 3 (three) times daily.   Yes [provider]  lisinopril (PRINIVIL,ZESTRIL) 40 MG tablet Take 40 mg by mouth every evening. 01/23/17  Yes [provider]  metFORMIN (GLUCOPHAGE) 1000 MG tablet Take 1,000 mg by mouth in the morning and at bedtime.   Yes [provider]  minocycline (MINOCIN,DYNACIN) 50 MG capsule Take 50 mg by mouth daily as needed (for acne breakouts).   Yes [provider]  nabumetone (RELAFEN) 750 MG tablet Take 750 mg by mouth in the morning and at bedtime.   Yes [provider]  pioglitazone (  ACTOS) 30 MG tablet Take 30 mg by mouth every evening.   Yes [provider]  rosuvastatin (CRESTOR) 40 MG tablet Take 20 mg by mouth daily.   Yes [provider]  tiZANidine (ZANAFLEX) 4 MG tablet Take 4 mg by mouth every 8 (eight) hours as needed for muscle spasms.   Yes [provider]  metFORMIN (GLUCOPHAGE) 500 MG tablet Take 1 tablet (500 mg total) by mouth 2 (two) times daily with a meal. Patient not taking: Reported on 12/28/2021 06/10/19   Placido Sou, PA-C      Allergies    Adhesive [tape]    Review of Systems   Review of Systems  Physical Exam Updated Vital Signs BP 115/72   Pulse  91   Temp 99.1 F (37.3 C) (Oral)   Resp 16   SpO2 97%  Physical Exam Constitutional:      General: He is not in acute distress. HENT:     Head: Normocephalic.     Comments: Superficial abrasions, 2 of them, crosses the scalp Eyes:     Conjunctiva/sclera: Conjunctivae normal.     Pupils: Pupils are equal, round, and reactive to light.  Cardiovascular:     Rate and Rhythm: Normal rate and regular rhythm.  Pulmonary:     Effort: Pulmonary effort is normal. No respiratory distress.  Abdominal:     General: There is no distension.     Tenderness: There is no abdominal tenderness.  Musculoskeletal:     Comments: Cam boot on right lower leg  Skin:    General: Skin is warm and dry.  Neurological:     General: No focal deficit present.     Mental Status: He is alert. Mental status is at baseline.  Psychiatric:        Mood and Affect: Mood normal.        Behavior: Behavior normal.     ED Results / Procedures / Treatments   Labs (all labs ordered are listed, but only abnormal results are displayed) Labs Reviewed  BASIC METABOLIC PANEL - Abnormal; Notable for the following components:      Result Value   Sodium 133 (*)    Glucose, Bld 482 (*)    BUN 38 (*)    Creatinine, Ser 1.51 (*)    Calcium 8.7 (*)    GFR, Estimated 55 (*)    All other components within normal limits  CBC WITH DIFFERENTIAL/PLATELET - Abnormal; Notable for the following components:   RBC 2.82 (*)    Hemoglobin 8.9 (*)    HCT 26.3 (*)    RDW 16.6 (*)    nRBC 0.3 (*)    Abs Immature Granulocytes 0.09 (*)    All other components within normal limits  URINALYSIS, ROUTINE W REFLEX MICROSCOPIC - Abnormal; Notable for the following components:   Glucose, UA >=500 (*)    Hgb urine dipstick SMALL (*)    Bacteria, UA RARE (*)    All other components within normal limits  BETA-HYDROXYBUTYRIC ACID - Abnormal; Notable for the following components:   Beta-Hydroxybutyric Acid 0.89 (*)    All other components  within normal limits  CBG MONITORING, ED - Abnormal; Notable for the following components:   Glucose-Capillary 399 (*)    All other components within normal limits  POC OCCULT BLOOD, ED - Abnormal; Notable for the following components:   Fecal Occult Bld POSITIVE (*)    All other components within normal limits  BLOOD GAS, VENOUS  HIV  ANTIBODY (ROUTINE TESTING W REFLEX)  CBC  BASIC METABOLIC PANEL  HEMOGLOBIN A1C  CBG MONITORING, ED    EKG None  Radiology DG Chest 2 View  Result Date: 12/28/2021 CLINICAL DATA:  Cough, pneumonia. EXAM: CHEST - 2 VIEW COMPARISON:  Chest x-ray 09/02/2014 FINDINGS: The heart size and mediastinal contours are within normal limits. Both lungs are clear. The visualized skeletal structures are unremarkable. IMPRESSION: No active cardiopulmonary disease. Electronically Signed   By: Darliss Cheney M.D.   On: 12/28/2021 16:21   CT Head Wo Contrast  Result Date: 12/28/2021 CLINICAL DATA:  Trauma, fall EXAM: CT HEAD WITHOUT CONTRAST TECHNIQUE: Contiguous axial images were obtained from the base of the skull through the vertex without intravenous contrast. RADIATION DOSE REDUCTION: This exam was performed according to the departmental dose-optimization program which includes automated exposure control, adjustment of the mA and/or kV according to patient size and/or use of iterative reconstruction technique. COMPARISON:  10/22/2017 FINDINGS: Brain: No acute intracranial findings are seen. There are no signs of bleeding within the cranium. Ventricles are not dilated. Cortical sulci are prominent. There is no focal edema or mass effect. Vascular: Unremarkable. Skull: No fracture is seen in calvarium. There is 10 mm soft tissue density structure in subcutaneous plane in the left parietal scalp, possibly sebaceous cysts. Sinuses/Orbits: There is air-fluid level in left side sphenoid sinus. Other: None. IMPRESSION: No acute intracranial findings are seen in noncontrast CT  brain. There is air-fluid level in left side of sphenoid sinus, possibly suggesting incidental sinusitis. Electronically Signed   By: Ernie Avena M.D.   On: 12/28/2021 16:15    Procedures Procedures    Medications Ordered in ED Medications  gabapentin (NEURONTIN) capsule 300 mg (has no administration in time range)  gabapentin (NEURONTIN) capsule 600 mg (600 mg Oral Given 12/28/21 2152)  0.9 %  sodium chloride infusion (has no administration in time range)  HYDROcodone-acetaminophen (NORCO/VICODIN) 5-325 MG per tablet 1-2 tablet (has no administration in time range)  ondansetron (ZOFRAN) tablet 4 mg (has no administration in time range)    Or  ondansetron (ZOFRAN) injection 4 mg (has no administration in time range)  pantoprazole (PROTONIX) injection 40 mg (has no administration in time range)  insulin aspart (novoLOG) injection 0-15 Units (has no administration in time range)  lactated ringers bolus 1,000 mL (0 mLs Intravenous Stopped 12/28/21 1833)  HYDROmorphone (DILAUDID) injection 0.5 mg (0.5 mg Intravenous Given 12/28/21 1711)  insulin aspart (novoLOG) injection 10 Units (10 Units Subcutaneous Given 12/28/21 2024)    ED Course/ Medical Decision Making/ A&P Clinical Course as of 12/28/21 2232  Wed Dec 28, 2021  2205 Admitted to hospitalist for GI bleed - Dr Marca Ancona from [facility] notified for consultation tomorrow; patient remains stable at this time. [MT]    Clinical Course User Index [MT] Shante Archambeault, Kermit Balo, MD                           Medical Decision Making Amount and/or Complexity of Data Reviewed Labs: ordered. Radiology: ordered.  Risk Prescription drug management. Decision regarding hospitalization.   This patient presents to the ED with concern for lightheadedness, fall, head injury. This involves an extensive number of treatment options, and is a complaint that carries with it a high risk of complications and morbidity.  The differential diagnosis includes  symptomatic hyperglycemia versus orthostatic hypotension versus dehydration versus anemia versus other  Co-morbidities that complicate the patient evaluation: History of diabetes  and recent steroid use at high risk of diabetic crisis  Additional history obtained from EMS  External records from outside source obtained and reviewed including orthopedic records for nondisplaced fracture of the right lower extremity, x-rays reviewed from December 1, with a comminuted nondisplaced obliquely oriented fracture of the proximal fibular shaft.  I ordered and personally interpreted labs.  The pertinent results include: Hyperglycemia. Anemia hgb 8.9 (last check 2 years ago, normal hgb); no anion gap.  Beta hydroxy level is elevated.  No acidosis.  I ordered imaging studies including CT head, dg chest I independently visualized and interpreted imaging which showed no acute emergent findings I agree with the radiologist interpretation  The patient was maintained on a cardiac monitor.  I personally viewed and interpreted the cardiac monitored which showed an underlying rhythm of: NSR  I ordered medication including dilaudid for pain (pt takes oxycodone 5 mg at home for his ankle), IV fluids and insulin for hyperglycemia   Test Considered: Doubt acute PE  After the interventions noted above, I reevaluated the patient and found that they have: worsened   After the patient's workup in the ED the patient was noted to be extremely unsteady on his feet.  I spoke to his sister by phone expresses concern of the patient has had frequent falls at home.  I suspect this is likely probably multifactorial, related to a combination of polypharmacy (he is on gabapentin, Zanaflex, oxycodone), poor fluid intake, and perhaps overly aggressive blood pressure control.  He is on carvedilol and amlodipine and lisinopril for blood pressure but his blood pressure has been on the hypotensive side here in the ED.  He may need some  medication adjustments.    He also reports he takes metformin at home as well as glipizide and is on 1000 mg metformin twice daily and 10 mg glipizide twice daily.  His diabetes appears to be quite poorly controlled.  My suspicion is this is a chronic issue but the patient tells me that his average sugars are typically "160" when he checks once weekly.    For these reasons, including potential anemia, hyperglycemia, diabetic management, hypotension and medication reconciliation, I will discuss with the hospitalist regarding admission versus consultation in the ED.  Update -patient's most recent stool was grossly melanotic and bloody.  He feels that this started "just today".  Given this finding I discussed with the hospitalist we will admit the patient.  Gastroenterology also consulted.  Dispostion:  After consideration of the diagnostic results and the patients response to treatment, I feel that the patent would benefit from medical admission         Final Clinical Impression(s) / ED Diagnoses Final diagnoses:  Gastrointestinal hemorrhage with melena  Hyperglycemia    Rx / DC Orders ED Discharge Orders     None         Terald Sleeperrifan, Esa Raden J, MD 12/28/21 2232

## 2021-12-28 NOTE — Assessment & Plan Note (Signed)
Hold all oral hypoglycemic agents Hydrate patient with normal saline Glycemic control with sliding scale insulin Clear liquid diet and advance to consistent carbohydrate after patient has been evaluated by GI

## 2021-12-28 NOTE — ED Notes (Signed)
Patient is alert and oriented.  He reports he has noticed his sugars have been reading high since he took steriods for a cough last week.  He has boot to the right leg due to fx from fall.  He has laceration/abrasion to the top of his head.  Wound care completed.  Patient reports he has pain 8/10 related to chronic back pain and his leg.

## 2021-12-28 NOTE — H&P (Signed)
History and Physical    Patient: GARLAN DREWES DVV:[id number] DOB: 1970-10-16 DOA: 12/28/2021 DOS: the patient was seen and examined on 12/28/2021 PCP: Barbie Banner, MD  Patient coming from: Home  Chief Complaint:  Chief Complaint  Patient presents with   Fall   HPI: Bryan [name] is a 51 y.o. male with medical history significant for morbid obesity, diabetes mellitus on oral agents, hypertension, nephrolithiasis, history of diverticulitis status post partial colectomy, history of chronic low back pain who presents to the ER by EMS for evaluation after a fall. Patient states that he got up and felt weak in his legs and fell hitting the top of his head on a dresser.  He was noted to have an abrasion to the top of his head.  He denies any loss of consciousness.  He states that he has had several falls over the last several weeks and sustained a nondisplaced right proximal fibula fracture for which he was prescribed a boot.  He denies feeling dizzy or lightheaded prior to the falls but states that his legs just get weak and give way.  He ambulates with a rolling walker and according to him, he had a rolling walker during his last 2 falls. He was prescribed ibuprofen 800 mg 3 times daily which he has been taking as prescribed.  Stools have been dark but he denies having any hematemesis or hematochezia. He denies having any chest pain, no shortness of breath, no nausea, no vomiting, no abdominal pain, no urinary symptoms, no leg swelling, no cough, no fever, no chills, no blurred vision, no focal deficit. Significant labs include a hemoglobin of 8.9 compared to 14.5 a year ago, blood sugar 482 with normal serum bicarbonate level Stool is heme positive Patient will be referred to observation status for further evaluation  Review of Systems: As mentioned in the history of present illness. All other systems reviewed and are negative. Past Medical History:  Diagnosis Date    Diabetes mellitus without complication (HCC)    History of diverticulitis of colon 07/30/2014   07-30-2014  acute diverticulitis;  08-21-2014 recurrent diverticulitis w/ microperforation, 08-26-2014 s/p partial colectomy (post-op intra-abdominal abscesses and complication wound healing)   Hypertension    Right ureteral stone    Past Surgical History:  Procedure Laterality Date   APPENDECTOMY  1981   CYSTOSCOPY/URETEROSCOPY/HOLMIUM LASER/STENT PLACEMENT Right 06/08/2017   Procedure: CYSTOSCOPY/RETROGRADE/URETEROSCOPY/HOLMIUM LASER/STENT PLACEMENT;  Surgeon: Rene Paci, MD;  Location: [facility];  Service: Urology;  Laterality: Right;  ONLY NEEDS 45 MIN   ILEOSTOMY CLOSURE N/A 12/31/2014   Procedure: ILEOSTOMY CLOSURE;  Surgeon: Harriette Bouillon, MD;  Location: [facility] OR;  Service: General;  Laterality: N/A;   INCISIONAL HERNIA REPAIR N/A 11/02/2015   Procedure: LAPAROSCOPIC INCISIONAL HERNIA;  Surgeon: Harriette Bouillon, MD;  [facility];  Service: General;  Laterality: N/A;   INSERTION OF MESH N/A 11/02/2015   Procedure: INSERTION OF MESH;  Surgeon: Harriette Bouillon, MD;  Location: [facility] OR;  Service: General;  Laterality: N/A;   LAPAROSCOPIC PARTIAL COLECTOMY N/A 08/26/2014   Procedure: LAPAROSCOPIC ASSISTED PARTIAL COLECTOMY WITH OSTOMY;  Surgeon: Harriette Bouillon, MD;  Location: [facility] OR;  Service: General;  Laterality: N/A;   Social History:  reports that he has never smoked. He has never used smokeless tobacco. He reports that he does not drink alcohol and does not use drugs.  Allergies  Allergen Reactions   Adhesive [Tape] Hives and Other (See Comments)    NO PLASTIC tape,  please!!    Family History  Problem Relation Age of Onset   Cancer Father        lung   Heart disease Father    Diabetes Maternal Grandmother     Prior to Admission medications   Medication Sig Start Date End Date Taking? Authorizing Provider  acetaminophen (TYLENOL) 325 MG tablet Take 2  tablets (650 mg total) by mouth every 6 (six) hours as needed for mild pain (or temp > 100). Patient taking differently: Take 650 mg by mouth every 6 (six) hours as needed for mild pain, fever or headache. 08/31/14  Yes Jorje GuildBaird, Megan N, PA-C  albuterol (VENTOLIN HFA) 108 (90 Base) MCG/ACT inhaler Inhale 2 puffs into the lungs every 6 (six) hours as needed for wheezing or shortness of breath.   Yes [provider]  amLODipine (NORVASC) 10 MG tablet Take 10 mg by mouth daily.   Yes [provider]  carvedilol (COREG) 12.5 MG tablet Take 12.5 mg by mouth in the morning and at bedtime.   Yes [provider]  cyclobenzaprine (FLEXERIL) 10 MG tablet Take 10 mg by mouth 3 (three) times daily.   Yes [provider]  fluticasone (FLONASE) 50 MCG/ACT nasal spray SPRAY 2 SPRAYS INTO EACH NOSTRIL EVERY DAY Patient taking differently: Place 2 sprays into both nostrils daily as needed for allergies or rhinitis. 01/06/19  Yes Karie Fetchlark, Laura P, DO  gabapentin (NEURONTIN) 300 MG capsule Take 300 mg by mouth in the morning.   Yes [provider]  gabapentin (NEURONTIN) 600 MG tablet Take 600 mg by mouth at bedtime. 06/01/19  Yes [provider]  glipiZIDE (GLUCOTROL XL) 10 MG 24 hr tablet Take 10 mg by mouth 2 (two) times daily with a meal.   Yes [provider]  HYDROcodone-acetaminophen (NORCO/VICODIN) 5-325 MG tablet Take 1 tablet by mouth in the morning and at bedtime.   Yes [provider]  ibuprofen (ADVIL) 800 MG tablet Take 800 mg by mouth 3 (three) times daily.   Yes [provider]  lisinopril (PRINIVIL,ZESTRIL) 40 MG tablet Take 40 mg by mouth every evening. 01/23/17  Yes [provider]  metFORMIN (GLUCOPHAGE) 1000 MG tablet Take 1,000 mg by mouth in the morning and at bedtime.   Yes [provider]  minocycline (MINOCIN,DYNACIN) 50 MG capsule Take 50 mg by mouth daily as needed (for acne breakouts).   Yes [provider]  nabumetone (RELAFEN) 750 MG tablet Take 750 mg by mouth in the morning and at bedtime.   Yes [provider]  pioglitazone (ACTOS) 30 MG tablet Take 30 mg by mouth every evening.   Yes [provider]  rosuvastatin (CRESTOR) 40 MG tablet Take 20 mg by mouth daily.   Yes [provider]  tiZANidine (ZANAFLEX) 4 MG tablet Take 4 mg by mouth every 8 (eight) hours as needed for muscle spasms.   Yes [provider]  metFORMIN (GLUCOPHAGE) 500 MG tablet Take 1 tablet (500 mg total) by mouth 2 (two) times daily with a meal. Patient not taking: Reported on 12/28/2021 06/10/19   Placido SouJoldersma, Logan, PA-C    Physical Exam: Vitals:   12/28/21 1930 12/28/21 1946 12/28/21 2000 12/28/21 2041  BP: 110/75  109/85 115/72  Pulse: 89  91 91  Resp: (!) 8  13 16   Temp:  99.1 F (37.3 C)    TempSrc:  Oral    SpO2: 100%  98% 97%   Physical Exam Vitals and nursing  note reviewed.  Constitutional:      Appearance: He is obese.  HENT:     Head: Normocephalic and atraumatic.     Nose: Nose normal.     Mouth/Throat:     Mouth: Mucous membranes are moist.  Eyes:     Comments: Pale conjunctiva  Cardiovascular:     Rate and Rhythm: Normal rate and regular rhythm.  Pulmonary:     Effort: Pulmonary effort is normal.     Breath sounds: Normal breath sounds.  Abdominal:     General: Abdomen is flat. Bowel sounds are normal.     Palpations: Abdomen is soft.  Musculoskeletal:     Cervical back: Normal range of motion and neck supple.     Comments: Decreased range of motion right ankle  Skin:    Comments: Abrasion over the top of the scalp   Neurological:     Mental Status: He is alert.     Motor: Weakness present.  Psychiatric:        Mood and Affect: Mood normal.        Behavior: Behavior normal.     Data Reviewed: Relevant notes from primary care and specialist visits, past discharge summaries as available in EHR, including Care Everywhere. Prior  diagnostic testing as pertinent to current admission diagnoses Updated medications and problem lists for reconciliation ED course, including vitals, labs, imaging, treatment and response to treatment Triage notes, nursing and pharmacy notes and ED provider's notes Notable results as noted in HPI Labs reviewed.  Sodium 133, potassium 4.7, chloride 101, bicarb 24, glucose 482, BUN 38, creatinine 1.51, calcium 8.7, white count 7.5, hemoglobin 8.9, hematocrit 26.3, platelet count 217 CT scan of the head without contrast shows no acute intracranial findings are seen in noncontrast CT brain. There is air-fluid level in left side of sphenoid sinus, possibly suggesting incidental sinusitis. Chest x-ray reviewed by me shows no evidence of acute cardiopulmonary disease There are no new results to review at this time.  Assessment and Plan: * Weakness generalized Patient presents to the ER for evaluation of generalized weakness and frequent falls. Noted to have anemia concerning for acute blood loss, Hb 14.3 >> 8.9 Stools are heme positive Obtain serum iron levels Place patient on fall precautions Consult GI for further evaluation  ABLA (acute blood loss anemia) Possible peptic ulcer disease versus gastritis related to NSAID use Patient has melena stools that are heme positive and has a significant drop in his H&H from 14.3 >> 8.9.  BUN is also elevated We will place patient on Protonix 40 mg IV every 12 Monitor serial H&H and transfuse as needed Consult gastroenterology  Essential hypertension Hold all antihypertensive medications which include amlodipine, carvedilol and lisinopril since patient is normotensive Monitor blood pressure closely  Diabetes mellitus with hyperglycemia (HCC) Hold all oral hypoglycemic agents Hydrate patient with normal saline Glycemic control with sliding scale insulin Clear liquid diet and advance to consistent carbohydrate after patient has been evaluated by  GI  Falls Status post multiple falls over the last several weeks and has a nondisplaced proximal fibula fracture to his right lower leg.  He also has lacerations to his scalp Treatment as outlined in 1      Advance Care Planning:   Code Status: Full Code   Consults: Gastroenterology, physical therapy  Family Communication: Greater than 50% of time was spent discussing patient's condition and plan of care with him at the bedside.  All questions and concerns have been addressed.  He verbalizes understanding and agrees with the plan.  CODE STATUS was discussed and patient is a full code  Severity of Illness: The appropriate patient status for this patient is OBSERVATION. Observation status is judged to be reasonable and necessary in order to provide the required intensity of service to ensure the patient's safety. The patient's presenting symptoms, physical exam findings, and initial radiographic and laboratory data in the context of their medical condition is felt to place them at decreased risk for further clinical deterioration. Furthermore, it is anticipated that the patient will be medically stable for discharge from the hospital within 2 midnights of admission.   Author: Lucile Shutters, MD 12/28/2021 10:48 PM  For on call review [email or web address].

## 2021-12-28 NOTE — ED Notes (Signed)
Spoke to lab to determine delay in results as specimens were hand delivered by this Clinical research associate. Waited on hold for 8 minutes while lab looked for specimens. Will redraw.

## 2021-12-28 NOTE — ED Notes (Signed)
Labs redrawn at 1851 and hand delivered to lab by this Clinical research associate. Informed lab they are greater than 2 hours past due.

## 2021-12-28 NOTE — ED Notes (Signed)
Pt ambulatory to bathroom w/o assist. No LOB noted. Steady gait. Pt has no complaints

## 2021-12-28 NOTE — Assessment & Plan Note (Signed)
Hold all antihypertensive medications which include amlodipine, carvedilol and lisinopril since patient is normotensive Monitor blood pressure closely

## 2021-12-28 NOTE — Assessment & Plan Note (Addendum)
Status post multiple falls over the last several weeks and has a nondisplaced proximal fibula fracture to his right lower leg.  He also has lacerations to his scalp Treatment as outlined in 1

## 2021-12-28 NOTE — ED Notes (Signed)
Pt. CBG 301, RN made aware.

## 2021-12-28 NOTE — Assessment & Plan Note (Signed)
Possible peptic ulcer disease versus gastritis related to NSAID use Patient has melena stools that are heme positive and has a significant drop in his H&H from 14.3 >> 8.9.  BUN is also elevated We will place patient on Protonix 40 mg IV every 12 Monitor serial H&H and transfuse as needed Consult gastroenterology

## 2021-12-28 NOTE — ED Triage Notes (Addendum)
Patient BIB [facility] from home. Had a fall at 1:30pm. Got up and felt weak and hit the top of his head on a dresser. Abrasion to the top of his head. Not on a blood thinner. No LOC. Increase in falls lately. Has type 2 diabetes and today the meter read high.   EMS 20G right hand fluid

## 2021-12-29 ENCOUNTER — Encounter (HOSPITAL_COMMUNITY)
Admission: EM | Disposition: A | Discharge status: Home / Self Care | Payer: Self-pay | Source: Home / Self Care | Attending: Internal Medicine

## 2021-12-29 ENCOUNTER — Observation Stay (HOSPITAL_COMMUNITY): Payer: BLUE CROSS/BLUE SHIELD | Admitting: Certified Registered Nurse Anesthetist

## 2021-12-29 DIAGNOSIS — Z833 Family history of diabetes mellitus: Secondary | ICD-10-CM | POA: Diagnosis not present

## 2021-12-29 DIAGNOSIS — Z8249 Family history of ischemic heart disease and other diseases of the circulatory system: Secondary | ICD-10-CM | POA: Diagnosis not present

## 2021-12-29 DIAGNOSIS — Z809 Family history of malignant neoplasm, unspecified: Secondary | ICD-10-CM | POA: Diagnosis not present

## 2021-12-29 DIAGNOSIS — K254 Chronic or unspecified gastric ulcer with hemorrhage: Secondary | ICD-10-CM | POA: Diagnosis present

## 2021-12-29 DIAGNOSIS — K209 Esophagitis, unspecified without bleeding: Secondary | ICD-10-CM | POA: Diagnosis present

## 2021-12-29 DIAGNOSIS — G8929 Other chronic pain: Secondary | ICD-10-CM | POA: Diagnosis present

## 2021-12-29 DIAGNOSIS — N179 Acute kidney failure, unspecified: Secondary | ICD-10-CM | POA: Diagnosis present

## 2021-12-29 DIAGNOSIS — I10 Essential (primary) hypertension: Secondary | ICD-10-CM | POA: Diagnosis present

## 2021-12-29 DIAGNOSIS — K2091 Esophagitis, unspecified with bleeding: Secondary | ICD-10-CM | POA: Diagnosis present

## 2021-12-29 DIAGNOSIS — K264 Chronic or unspecified duodenal ulcer with hemorrhage: Secondary | ICD-10-CM | POA: Diagnosis present

## 2021-12-29 DIAGNOSIS — S0101XA Laceration without foreign body of scalp, initial encounter: Secondary | ICD-10-CM | POA: Diagnosis present

## 2021-12-29 DIAGNOSIS — Z91048 Other nonmedicinal substance allergy status: Secondary | ICD-10-CM | POA: Diagnosis not present

## 2021-12-29 DIAGNOSIS — T39395A Adverse effect of other nonsteroidal anti-inflammatory drugs [NSAID], initial encounter: Secondary | ICD-10-CM | POA: Diagnosis present

## 2021-12-29 DIAGNOSIS — Z7984 Long term (current) use of oral hypoglycemic drugs: Secondary | ICD-10-CM | POA: Diagnosis not present

## 2021-12-29 DIAGNOSIS — W1830XA Fall on same level, unspecified, initial encounter: Secondary | ICD-10-CM | POA: Diagnosis present

## 2021-12-29 DIAGNOSIS — Z79899 Other long term (current) drug therapy: Secondary | ICD-10-CM | POA: Diagnosis not present

## 2021-12-29 DIAGNOSIS — E1165 Type 2 diabetes mellitus with hyperglycemia: Secondary | ICD-10-CM | POA: Diagnosis present

## 2021-12-29 DIAGNOSIS — Y92009 Unspecified place in unspecified non-institutional (private) residence as the place of occurrence of the external cause: Secondary | ICD-10-CM | POA: Diagnosis not present

## 2021-12-29 DIAGNOSIS — R296 Repeated falls: Secondary | ICD-10-CM | POA: Diagnosis present

## 2021-12-29 DIAGNOSIS — E1140 Type 2 diabetes mellitus with diabetic neuropathy, unspecified: Secondary | ICD-10-CM | POA: Diagnosis present

## 2021-12-29 DIAGNOSIS — Z9049 Acquired absence of other specified parts of digestive tract: Secondary | ICD-10-CM | POA: Diagnosis not present

## 2021-12-29 DIAGNOSIS — M545 Low back pain, unspecified: Secondary | ICD-10-CM | POA: Diagnosis present

## 2021-12-29 DIAGNOSIS — R531 Weakness: Secondary | ICD-10-CM | POA: Diagnosis present

## 2021-12-29 DIAGNOSIS — D62 Acute posthemorrhagic anemia: Secondary | ICD-10-CM | POA: Diagnosis present

## 2021-12-29 DIAGNOSIS — Z6841 Body Mass Index (BMI) 40.0 and over, adult: Secondary | ICD-10-CM | POA: Diagnosis not present

## 2021-12-29 DIAGNOSIS — Z801 Family history of malignant neoplasm of trachea, bronchus and lung: Secondary | ICD-10-CM | POA: Diagnosis not present

## 2021-12-29 HISTORY — PX: BIOPSY: SHX5522

## 2021-12-29 HISTORY — PX: ESOPHAGOGASTRODUODENOSCOPY (EGD) WITH PROPOFOL: SHX5813

## 2021-12-29 HISTORY — PX: HOT HEMOSTASIS: SHX5433

## 2021-12-29 HISTORY — PX: SCLEROTHERAPY: SHX6841

## 2021-12-29 LAB — CBC
HCT: 25.9 % — ABNORMAL LOW (ref 39.0–52.0)
Hemoglobin: 8.3 g/dL — ABNORMAL LOW (ref 13.0–17.0)
MCH: 29.3 pg (ref 26.0–34.0)
MCHC: 32 g/dL (ref 30.0–36.0)
MCV: 91.5 fL (ref 80.0–100.0)
Platelets: 213 10*3/uL (ref 150–400)
RBC: 2.83 MIL/uL — ABNORMAL LOW (ref 4.22–5.81)
RDW: 16.4 % — ABNORMAL HIGH (ref 11.5–15.5)
WBC: 11.1 10*3/uL — ABNORMAL HIGH (ref 4.0–10.5)
nRBC: 0.4 % — ABNORMAL HIGH (ref 0.0–0.2)

## 2021-12-29 LAB — GLUCOSE, CAPILLARY
Glucose-Capillary: 205 mg/dL — ABNORMAL HIGH (ref 70–99)
Glucose-Capillary: 230 mg/dL — ABNORMAL HIGH (ref 70–99)
Glucose-Capillary: 259 mg/dL — ABNORMAL HIGH (ref 70–99)
Glucose-Capillary: 278 mg/dL — ABNORMAL HIGH (ref 70–99)

## 2021-12-29 LAB — BASIC METABOLIC PANEL
Anion gap: 8 (ref 5–15)
BUN: 28 mg/dL — ABNORMAL HIGH (ref 6–20)
CO2: 25 mmol/L (ref 22–32)
Calcium: 8.7 mg/dL — ABNORMAL LOW (ref 8.9–10.3)
Chloride: 105 mmol/L (ref 98–111)
Creatinine, Ser: 1.33 mg/dL — ABNORMAL HIGH (ref 0.61–1.24)
GFR, Estimated: >60 mL/min (ref >60)
Glucose, Bld: 230 mg/dL — ABNORMAL HIGH (ref 70–99)
Potassium: 4 mmol/L (ref 3.5–5.1)
Sodium: 138 mmol/L (ref 135–145)

## 2021-12-29 LAB — CBG MONITORING, ED
Glucose-Capillary: 218 mg/dL — ABNORMAL HIGH (ref 70–99)
Glucose-Capillary: 301 mg/dL — ABNORMAL HIGH (ref 70–99)
Glucose-Capillary: 228 mg/dL — ABNORMAL HIGH (ref 70–99)

## 2021-12-29 LAB — HEMOGLOBIN AND HEMATOCRIT, BLOOD
HCT: 25 % — ABNORMAL LOW (ref 39.0–52.0)
Hemoglobin: 8.2 g/dL — ABNORMAL LOW (ref 13.0–17.0)

## 2021-12-29 LAB — LACTIC ACID, PLASMA: Lactic Acid, Venous: 0.8 mmol/L (ref 0.5–1.9)

## 2021-12-29 LAB — HIV ANTIBODY (ROUTINE TESTING W REFLEX): HIV Screen 4th Generation wRfx: NONREACTIVE

## 2021-12-29 SURGERY — ESOPHAGOGASTRODUODENOSCOPY (EGD) WITH PROPOFOL
Anesthesia: Monitor Anesthesia Care

## 2021-12-29 MED ORDER — PROPOFOL 10 MG/ML IV BOLUS
INTRAVENOUS | Status: AC
  Filled 2021-12-29: qty 20

## 2021-12-29 MED ORDER — SODIUM CHLORIDE 0.9 % IV SOLN
2.0000 g | INTRAVENOUS | Status: DC
  Administered 2021-12-29 – 2021-12-30 (×2): 2 g via INTRAVENOUS
  Filled 2021-12-29 (×2): qty 20

## 2021-12-29 MED ORDER — ONDANSETRON HCL 4 MG/2ML IJ SOLN
INTRAMUSCULAR | Status: DC | PRN
  Administered 2021-12-29: 4 mg via INTRAVENOUS

## 2021-12-29 MED ORDER — GLIPIZIDE ER 5 MG PO TB24
5.0000 mg | ORAL_TABLET | Freq: Every day | ORAL | Status: DC
  Administered 2021-12-29 – 2021-12-30 (×2): 5 mg via ORAL
  Filled 2021-12-29 (×2): qty 1

## 2021-12-29 MED ORDER — PROPOFOL 500 MG/50ML IV EMUL
INTRAVENOUS | Status: DC | PRN
  Administered 2021-12-29: 135 ug/kg/min via INTRAVENOUS

## 2021-12-29 MED ORDER — CARVEDILOL 3.125 MG PO TABS
3.1250 mg | ORAL_TABLET | Freq: Two times a day (BID) | ORAL | Status: DC
  Administered 2021-12-29 – 2021-12-30 (×3): 3.125 mg via ORAL
  Filled 2021-12-29 (×3): qty 1

## 2021-12-29 MED ORDER — PANTOPRAZOLE INFUSION (NEW) - SIMPLE MED
8.0000 mg/h | INTRAVENOUS | Status: DC
  Administered 2021-12-29 – 2021-12-30 (×3): 8 mg/h via INTRAVENOUS
  Filled 2021-12-29: qty 80
  Filled 2021-12-29: qty 100
  Filled 2021-12-29: qty 80
  Filled 2021-12-29: qty 100
  Filled 2021-12-29: qty 80
  Filled 2021-12-29: qty 100

## 2021-12-29 MED ORDER — SODIUM CHLORIDE (PF) 0.9 % IJ SOLN
PREFILLED_SYRINGE | INTRAMUSCULAR | Status: DC | PRN
  Administered 2021-12-29: 4 mL

## 2021-12-29 MED ORDER — LIDOCAINE 2% (20 MG/ML) 5 ML SYRINGE
INTRAMUSCULAR | Status: DC | PRN
  Administered 2021-12-29: 100 mg via INTRAVENOUS

## 2021-12-29 MED ORDER — SODIUM CHLORIDE 0.9 % IV SOLN: INTRAVENOUS | Status: DC

## 2021-12-29 MED ORDER — PROPOFOL 10 MG/ML IV BOLUS
INTRAVENOUS | Status: DC | PRN
  Administered 2021-12-29 (×2): 20 mg via INTRAVENOUS

## 2021-12-29 MED ORDER — PROPOFOL 500 MG/50ML IV EMUL
INTRAVENOUS | Status: AC
  Filled 2021-12-29: qty 50

## 2021-12-29 MED ORDER — EPINEPHRINE 1 MG/10ML IJ SOSY
PREFILLED_SYRINGE | INTRAMUSCULAR | Status: AC
  Filled 2021-12-29: qty 10

## 2021-12-29 MED ORDER — PIOGLITAZONE HCL 30 MG PO TABS
30.0000 mg | ORAL_TABLET | Freq: Every evening | ORAL | Status: DC
  Administered 2021-12-29: 30 mg via ORAL
  Filled 2021-12-29 (×2): qty 1

## 2021-12-29 MED ORDER — ACETAMINOPHEN 325 MG PO TABS
650.0000 mg | ORAL_TABLET | Freq: Four times a day (QID) | ORAL | Status: DC | PRN
  Administered 2021-12-29: 650 mg via ORAL
  Filled 2021-12-29: qty 2

## 2021-12-29 SURGICAL SUPPLY — 15 items

## 2021-12-29 NOTE — ED Notes (Signed)
Patient transported to Endo, will be returning to room 14

## 2021-12-29 NOTE — Anesthesia Preprocedure Evaluation (Signed)
Anesthesia Evaluation  Patient identified by MRN, date of birth, ID band Patient awake    Reviewed: Allergy & Precautions, NPO status , Patient's Chart, lab work & pertinent test results  History of Anesthesia Complications Negative for: history of anesthetic complications  Airway Mallampati: III  TM Distance: >3 FB Neck ROM: Full    Dental  (+) Teeth Intact, Dental Advisory Given,    Pulmonary neg pulmonary ROS   breath sounds clear to auscultation       Cardiovascular hypertension, Pt. on medications and Pt. on home beta blockers (-) angina (-) Past MI and (-) CHF  Rhythm:Regular     Neuro/Psych negative neurological ROS  negative psych ROS   GI/Hepatic Neg liver ROS,,,? GI bleed   Endo/Other  diabetes  Lab Results      Component                Value               Date                      HGBA1C                   17.1 (H)            06/10/2019             Renal/GU Renal InsufficiencyRenal diseaseLab Results      Component                Value               Date                      CREATININE               1.33 (H)            12/29/2021                Musculoskeletal Right prox fibula fx   Abdominal   Peds  Hematology  (+) Blood dyscrasia, anemia Lab Results      Component                Value               Date                      WBC                      11.1 (H)            12/29/2021                HGB                      8.2 (L)             12/29/2021                HCT                      25.0 (L)            12/29/2021                MCV                      91.5  12/29/2021                PLT                      213                 12/29/2021              Anesthesia Other Findings   Reproductive/Obstetrics                             Anesthesia Physical Anesthesia Plan  ASA: 3  Anesthesia Plan: MAC   Post-op Pain Management: Minimal or no pain  anticipated   Induction: Intravenous  PONV Risk Score and Plan: 1 and Propofol infusion and Treatment may vary due to age or medical condition  Airway Management Planned: Nasal Cannula and Natural Airway  Additional Equipment: None  Intra-op Plan:   Post-operative Plan:   Informed Consent: I have reviewed the patients History and Physical, chart, labs and discussed the procedure including the risks, benefits and alternatives for the proposed anesthesia with the patient or authorized representative who has indicated his/her understanding and acceptance.     Dental advisory given  Plan Discussed with: CRNA  Anesthesia Plan Comments:        Anesthesia Quick Evaluation

## 2021-12-29 NOTE — Transfer of Care (Signed)
Immediate Anesthesia Transfer of Care Note  Patient: Bryan [name]  Procedure(s) Performed: ESOPHAGOGASTRODUODENOSCOPY (EGD) WITH PROPOFOL SCLEROTHERAPY BIOPSY HOT HEMOSTASIS (ARGON PLASMA COAGULATION/BICAP)  Patient Location: Endoscopy Unit  Anesthesia Type:MAC  Level of Consciousness: awake, alert , and oriented  Airway & Oxygen Therapy: Patient Spontanous Breathing and Patient connected to face mask oxygen  Post-op Assessment: Report given to RN and Post -op Vital signs reviewed and stable  Post vital signs: Reviewed and stable  Last Vitals:  Vitals Value Taken Time  BP    Temp    Pulse 101 12/29/21 1354  Resp 15 12/29/21 1354  SpO2 100 % 12/29/21 1354  Vitals shown include unvalidated device data.  Last Pain:  Vitals:   12/29/21 1253  TempSrc: Temporal  PainSc:       Patients Stated Pain Goal: 8 (74/[id number])  Complications: No notable events documented.

## 2021-12-29 NOTE — Anesthesia Procedure Notes (Signed)
Procedure Name: MAC Date/Time: 12/29/2021 1:22 PM  Performed by: Maxwell Caul, CRNAPre-anesthesia Checklist: Patient identified, Emergency Drugs available, Suction available and Patient being monitored Oxygen Delivery Method: Simple face mask

## 2021-12-29 NOTE — Inpatient Diabetes Management (Signed)
Inpatient Diabetes Program Recommendations  AACE/ADA: New Consensus Statement on Inpatient Glycemic Control (2015)  Target Ranges:  Prepandial:   less than 140 mg/dL      Peak postprandial:   less than 180 mg/dL (1-2 hours)      Critically ill patients:  140 - 180 mg/dL    Latest Reference Range & Units 12/28/21 20:40 12/28/21 23:45 12/29/21 03:48 12/29/21 08:16  Glucose-Capillary 70 - 99 mg/dL 595 (H)  10 units Novolog  301 (H)  11 units Novolog  218 (H)  5 units Novolog  228 (H)  5  units Novolog   (H): Data is abnormally high    Admit with:  Fall Weakness generalized  ABLA (acute blood loss anemia)   History: DM  Home DM Meds: Glipizide XL 10 mg BID        Metformin 1000 mg BID        Actos 30 mg Daily  Current Orders: Novolog Moderate Correction Scale/ SSI (0-15 units) Q4 hours       Actos 30 mg QPM       Glipizide 5 mg Daily    PCP: [facility] Last A1c was 9.2% (06/02/2021) at PCP visit Actos added at last PCP visit  Current A1c Pending (Not sure of accuracy given Hgb was low on admission)??  Note Oral DM meds added this AM  MD- If AM CBG remains elevated tomorrow AM, please consider starting Semglee 13 units Daily (0.1 units/kg)   --Will follow patient during hospitalization--  Ambrose Finland RN, MSN, CDCES Diabetes Coordinator Inpatient Glycemic Control Team Team Pager: [phone] (8a-5p)

## 2021-12-29 NOTE — Consult Note (Addendum)
Referring Provider: ED/TH Primary Care Physician:  Barbie Banner, MD Primary Gastroenterologist: Unassigned/[facility]  Reason for Consultation: Anemia, heme positive stool  HPI: Bryan [name] is a 51 y.o. male with past medical history of diabetes, morbid obesity, history of diverticulitis s/p partial colectomy, history of chronic low back pain presented to the hospital with fall.  Reported several falls in the last few weeks.  Upon initial evaluation, he was found to have drop in hemoglobin to 8.9 with normal hemoglobin a year ago.  He admits to taking ibuprofen 800 mg 3 times a day.  Also admits having dark stools.  Found to have heme positive stool.  GI is consulted for further evaluation.   Patient seen and examined at bedside.  He has been having intermittent falls since Thanksgiving.  Has been taking meloxicam.  Was also recently started on ibuprofen.  Complaining of intermittent fresh blood in the stool for many years.  Had EGD and colonoscopy in 2021 at [facility] which was normal according to patient.  No reports available to review.  Started seeing dark stools yesterday.  Denies any abdominal pain, nausea or vomiting.  Denies reflux, trouble swallowing or pain while swallowing.  Denies unintentional weight loss.  Denies diarrhea or constipation. Past Medical History:  Diagnosis Date   Diabetes mellitus without complication (HCC)    History of diverticulitis of colon 07/30/2014   07-30-2014  acute diverticulitis;  08-21-2014 recurrent diverticulitis w/ microperforation, 08-26-2014 s/p partial colectomy (post-op intra-abdominal abscesses and complication wound healing)   Hypertension    Right ureteral stone     Past Surgical History:  Procedure Laterality Date   APPENDECTOMY  1981   CYSTOSCOPY/URETEROSCOPY/HOLMIUM LASER/STENT PLACEMENT Right 06/08/2017   Procedure: CYSTOSCOPY/RETROGRADE/URETEROSCOPY/HOLMIUM LASER/STENT PLACEMENT;  Surgeon: Rene Paci, MD;  Location: [facility];  Service: Urology;  Laterality: Right;  ONLY NEEDS 45 MIN   ILEOSTOMY CLOSURE N/A 12/31/2014   Procedure: ILEOSTOMY CLOSURE;  Surgeon: Harriette Bouillon, MD;  Location: [facility] OR;  Service: General;  Laterality: N/A;   INCISIONAL HERNIA REPAIR N/A 11/02/2015   Procedure: LAPAROSCOPIC INCISIONAL HERNIA;  Surgeon: Harriette Bouillon, MD;  Location: [facility] OR;  Service: General;  Laterality: N/A;   INSERTION OF MESH N/A 11/02/2015   Procedure: INSERTION OF MESH;  Surgeon: Harriette Bouillon, MD;  Location: [facility] OR;  Service: General;  Laterality: N/A;   LAPAROSCOPIC PARTIAL COLECTOMY N/A 08/26/2014   Procedure: LAPAROSCOPIC ASSISTED PARTIAL COLECTOMY WITH OSTOMY;  Surgeon: Harriette Bouillon, MD;  Location: [facility] OR;  Service: General;  Laterality: N/A;    Prior to Admission medications   Medication Sig Start Date End Date Taking? Authorizing Provider  acetaminophen (TYLENOL) 325 MG tablet Take 2 tablets (650 mg total) by mouth every 6 (six) hours as needed for mild pain (or temp > 100). Patient taking differently: Take 650 mg by mouth every 6 (six) hours as needed for mild pain, fever or headache. 08/31/14  Yes Jorje Guild N, PA-C  albuterol (VENTOLIN HFA) 108 (90 Base) MCG/ACT inhaler Inhale 2 puffs into the lungs every 6 (six) hours as needed for wheezing or shortness of breath.   Yes [provider]  amLODipine (NORVASC) 10 MG tablet Take 10 mg by mouth daily.   Yes [provider]  carvedilol (COREG) 12.5 MG tablet Take 12.5 mg by mouth in the morning and at bedtime.   Yes [provider]  cyclobenzaprine (FLEXERIL) 10 MG tablet Take 10 mg by mouth 3 (three) times daily.  Yes [provider]  fluticasone (FLONASE) 50 MCG/ACT nasal spray SPRAY 2 SPRAYS INTO EACH NOSTRIL EVERY DAY Patient taking differently: Place 2 sprays into both nostrils daily as needed for allergies or rhinitis. 01/06/19  Yes Karie Fetchlark, Laura P, DO   gabapentin (NEURONTIN) 300 MG capsule Take 300 mg by mouth in the morning.   Yes [provider]  gabapentin (NEURONTIN) 600 MG tablet Take 600 mg by mouth at bedtime. 06/01/19  Yes [provider]  glipiZIDE (GLUCOTROL XL) 10 MG 24 hr tablet Take 10 mg by mouth 2 (two) times daily with a meal.   Yes [provider]  HYDROcodone-acetaminophen (NORCO/VICODIN) 5-325 MG tablet Take 1 tablet by mouth in the morning and at bedtime.   Yes [provider]  ibuprofen (ADVIL) 800 MG tablet Take 800 mg by mouth 3 (three) times daily.   Yes [provider]  lisinopril (PRINIVIL,ZESTRIL) 40 MG tablet Take 40 mg by mouth every evening. 01/23/17  Yes [provider]  metFORMIN (GLUCOPHAGE) 1000 MG tablet Take 1,000 mg by mouth in the morning and at bedtime.   Yes [provider]  minocycline (MINOCIN,DYNACIN) 50 MG capsule Take 50 mg by mouth daily as needed (for acne breakouts).   Yes [provider]  nabumetone (RELAFEN) 750 MG tablet Take 750 mg by mouth in the morning and at bedtime.   Yes [provider]  pioglitazone (ACTOS) 30 MG tablet Take 30 mg by mouth every evening.   Yes [provider]  rosuvastatin (CRESTOR) 40 MG tablet Take 20 mg by mouth daily.   Yes [provider]  tiZANidine (ZANAFLEX) 4 MG tablet Take 4 mg by mouth every 8 (eight) hours as needed for muscle spasms.   Yes [provider]  metFORMIN (GLUCOPHAGE) 500 MG tablet Take 1 tablet (500 mg total) by mouth 2 (two) times daily with a meal. Patient not taking: Reported on 12/28/2021 06/10/19   Placido SouJoldersma, Logan, PA-C    Scheduled Meds:  gabapentin  300 mg Oral q AM   gabapentin  600 mg Oral QHS   insulin aspart  0-15 Units Subcutaneous Q4H   pantoprazole (PROTONIX) IV  40 mg Intravenous Q12H   Continuous Infusions:  sodium chloride 100 mL/hr at 12/28/21 2305   PRN Meds:.HYDROcodone-acetaminophen, ondansetron **OR** ondansetron  (ZOFRAN) IV  Allergies as of 12/28/2021 - Review Complete 12/28/2021  Allergen Reaction Noted   Adhesive [tape] Hives and Other (See Comments) 11/02/2015    Family History  Problem Relation Age of Onset   Cancer Father        lung   Heart disease Father    Diabetes Maternal Grandmother     Social History   Socioeconomic History   Marital status: Single    Spouse name: Not on file   Number of children: Not on file   Years of education: Not on file   Highest education level: Not on file  Occupational History   Not on file  Tobacco Use   Smoking status: Never   Smokeless tobacco: Never  Vaping Use   Vaping Use: Never used  Substance and Sexual Activity   Alcohol use: No   Drug use: No   Sexual activity: Yes  Other Topics Concern   Not on file  Social History Narrative   Not on file   Social Determinants of Health   Financial Resource Strain: Not on file  Food Insecurity: Not on file  Transportation Needs: Not on file  Physical Activity:  Not on file  Stress: Not on file  Social Connections: Not on file  Intimate Partner Violence: Not on file    Review of Systems: All negative except as stated above in HPI.  Physical Exam: Vital signs: Vitals:   12/29/21 0600 12/29/21 0744  BP: 109/63   Pulse: (!) 104   Resp: 18   Temp:  100.3 F (37.9 C)  SpO2: 99%    Physical Exam Constitutional:      General: He is not in acute distress.    Appearance: Normal appearance. He is not ill-appearing.  HENT:     Head: Normocephalic.     Nose: Nose normal.     Mouth/Throat:     Mouth: Mucous membranes are moist.  Eyes:     General: No scleral icterus.    Extraocular Movements: Extraocular movements intact.  Cardiovascular:     Rate and Rhythm: Regular rhythm. Tachycardia present.     Heart sounds: No murmur heard. Pulmonary:     Effort: Pulmonary effort is normal. No respiratory distress.     Breath sounds: Normal breath sounds.  Abdominal:     General: Bowel  sounds are normal. There is no distension.     Palpations: Abdomen is soft.     Tenderness: There is no abdominal tenderness. There is no guarding.  Musculoskeletal:     Cervical back: Normal range of motion.     Left lower leg: No edema.     Comments: Right lower extremity stabilizer  noted.  No edema in the left lower extremity.  Skin:    General: Skin is warm.     Coloration: Skin is not jaundiced.  Neurological:     Mental Status: He is alert and oriented to person, place, and time.  Psychiatric:        Mood and Affect: Mood normal.        Behavior: Behavior normal.        Thought Content: Thought content normal.      GI:  Lab Results: Recent Labs    12/28/21 1851 12/29/21 0526  WBC 7.5 11.1*  HGB 8.9* 8.3*  HCT 26.3* 25.9*  PLT 217 213   BMET Recent Labs    12/28/21 1851 12/29/21 0526  NA 133* 138  K 4.7 4.0  CL 101 105  CO2 24 25  GLUCOSE 482* 230*  BUN 38* 28*  CREATININE 1.51* 1.33*  CALCIUM 8.7* 8.7*   LFT No results for input(s): "PROT", "ALBUMIN", "AST", "ALT", "ALKPHOS", "BILITOT", "BILIDIR", "IBILI" in the last 72 hours. PT/INR No results for input(s): "LABPROT", "INR" in the last 72 hours.   Studies/Results: DG Chest 2 View  Result Date: 12/28/2021 CLINICAL DATA:  Cough, pneumonia. EXAM: CHEST - 2 VIEW COMPARISON:  Chest x-ray 09/02/2014 FINDINGS: The heart size and mediastinal contours are within normal limits. Both lungs are clear. The visualized skeletal structures are unremarkable. IMPRESSION: No active cardiopulmonary disease. Electronically Signed   By: Darliss Cheney M.D.   On: 12/28/2021 16:21   CT Head Wo Contrast  Result Date: 12/28/2021 CLINICAL DATA:  Trauma, fall EXAM: CT HEAD WITHOUT CONTRAST TECHNIQUE: Contiguous axial images were obtained from the base of the skull through the vertex without intravenous contrast. RADIATION DOSE REDUCTION: This exam was performed according to the departmental dose-optimization program which  includes automated exposure control, adjustment of the mA and/or kV according to patient size and/or use of iterative reconstruction technique. COMPARISON:  10/22/2017 FINDINGS: Brain: No acute intracranial findings are seen. There  are no signs of bleeding within the cranium. Ventricles are not dilated. Cortical sulci are prominent. There is no focal edema or mass effect. Vascular: Unremarkable. Skull: No fracture is seen in calvarium. There is 10 mm soft tissue density structure in subcutaneous plane in the left parietal scalp, possibly sebaceous cysts. Sinuses/Orbits: There is air-fluid level in left side sphenoid sinus. Other: None. IMPRESSION: No acute intracranial findings are seen in noncontrast CT brain. There is air-fluid level in left side of sphenoid sinus, possibly suggesting incidental sinusitis. Electronically Signed   By: Ernie Avena M.D.   On: 12/28/2021 16:15    Impression/Plan: -Anemia with dark stools.  Patient with NSAID use.  Need to rule out ulcer disease. -Intermittent rectal bleeding.  Had EGD and colonoscopy done at [facility] in 2021. - multiple falls.  Unknown etiology.  Recommendations ----------------------- -Continue supportive care for now.  Plan for EGD later today depending on anesthesia availability -Avoid NSAIDs -Continue IV twice daily PPI  Risks (bleeding, infection, bowel perforation that could require surgery, sedation-related changes in cardiopulmonary systems), benefits (identification and possible treatment of source of symptoms, exclusion of certain causes of symptoms), and alternatives (watchful waiting, radiographic imaging studies, empiric medical treatment)  were explained to patient/family in detail and patient wishes to proceed.     LOS: 0 days   Kathi Der  MD, FACP 12/29/2021, 8:30 AM  Contact #  [phone]

## 2021-12-29 NOTE — Progress Notes (Signed)
  PT Cancellation Note  Patient Details Name: Bryan [name] MRN: [id number] DOB: [birth year]   Cancelled Treatment:    Reason Eval/Treat Not Completed: Patient at procedure or test/unavailable  Blanchard Kelch PT Acute Rehabilitation Services Office [phone] Weekend [phone]  Rada Hay 12/29/2021, 1:13 PM

## 2021-12-29 NOTE — Brief Op Note (Addendum)
12/28/2021 - 12/29/2021  1:59 PM  PATIENT:  Bryan [name]  51 y.o. male  PRE-OPERATIVE DIAGNOSIS:  GI bleed  POST-OPERATIVE DIAGNOSIS:  * No post-op diagnosis entered *  PROCEDURE:  Procedure(s): ESOPHAGOGASTRODUODENOSCOPY (EGD) WITH PROPOFOL (N/A) SCLEROTHERAPY BIOPSY HOT HEMOSTASIS (ARGON PLASMA COAGULATION/BICAP) (N/A)  SURGEON:  Surgeon(s) and Role:    * Ashyah Quizon, MD - Primary  Findings ----------- -EGD showed blood clots and fresh blood in the duodenum.  After removing blood clot, I visualized large 15 mm ulcer with visible vessel in the duodenal bulb which was treated with 4 cc  epinephrine injection and gold probe cautery. -EGD also showed mild esophagitis and small gastric ulcer.  Biopsy performed.  Recommendations -------------------------- -Okay to have clear liquid diet today. -Change PPI to Protonix drip for now -Avoid NSAIDs -Patient is at high risk for recurrent bleeding, recommend observation and telemetry. -GI will follow  Kathi Der MD, FACP 12/29/2021, 2:01 PM  Contact #  [phone]

## 2021-12-29 NOTE — Op Note (Signed)
[facility] Patient Name: Bryan [name] Procedure Date: 12/29/2021 MRN: [id number] Attending MD: Kathi DerParag Vernice Bowker , MD, [phone] Date of Birth: 12/25/1970 CSN: [id number] Age: [age] Admit Type: Inpatient Procedure:                Upper GI endoscopy Indications:              Melena Providers:                Kathi DerParag Tippi Mccrae, MD, Stephens ShireKelly Barker RN, RN, Marja KaysJoseph                            Robertson, Technician Referring MD:              Medicines:                Sedation Administered by an Anesthesia Professional Complications:            No immediate complications. Estimated Blood Loss:     Estimated blood loss was minimal. Procedure:                Pre-Anesthesia Assessment:                           - Prior to the procedure, a History and Physical                            was performed, and patient medications and                            allergies were reviewed. The patient's tolerance of                            previous anesthesia was also reviewed. The risks                            and benefits of the procedure and the sedation                            options and risks were discussed with the patient.                            All questions were answered, and informed consent                            was obtained. Prior Anticoagulants: The patient has                            taken no anticoagulant or antiplatelet agents                            except for NSAID medication. ASA Grade Assessment:                            III - A patient with severe systemic disease. After  reviewing the risks and benefits, the patient was                            deemed in satisfactory condition to undergo the                            procedure.                           After obtaining informed consent, the endoscope was                            passed under direct vision. Throughout the                            procedure,  the patient's blood pressure, pulse, and                            oxygen saturations were monitored continuously. The                            [id number]-H190 ([id number]) Olympus endoscope was introduced                            through the mouth, and advanced to the second part                            of duodenum. The upper GI endoscopy was performed                            with difficulty. The patient tolerated the                            procedure well. Scope In: Scope Out: Findings:      LA Grade A (one or more mucosal breaks less than 5 mm, not extending       between tops of 2 mucosal folds) esophagitis was found at the       gastroesophageal junction.      Retained fluid was found in the gastric body.      One non-bleeding superficial gastric ulcer with no stigmata of bleeding       was found in the prepyloric region of the stomach. The lesion was 3 mm       in largest dimension. Biopsies were taken with a cold forceps for       histology.      The cardia and gastric fundus were normal on retroflexion.      Clotted blood was found in the duodenal bulb, in the first portion of       the duodenum and in the second portion of the duodenum.Blood clot was       removed using suction and rat-tooth forcep.      One non-obstructing oozing cratered duodenal ulcer with a visible vessel       was found in the duodenal bulb. The lesion was 15 mm in largest       dimension. There is no evidence of perforation.  Area was successfully       injected with 4 mL of a 0.1 mg/mL solution of epinephrine for       hemostasis. Fulguration to ablate the lesion by bipolar probe was       successful.      Few superficial duodenal ulcers were found in the duodenal bulb. The       largest lesion was 8 mm in largest dimension. Impression:               - LA Grade A esophagitis.                           - Retained gastric fluid.                           - Non-bleeding gastric ulcer with no stigmata  of                            bleeding. Biopsied.                           - Blood in the duodenal bulb and in the second                            portion of the duodenum.                           - Non-obstructing oozing duodenal ulcer with a                            visible vessel. NSAID induced etiology. There is no                            evidence of perforation. Injected. Treated with                            bipolar cautery.                           - Duodenal ulcers. Moderate Sedation:      Moderate (conscious) sedation was personally administered by an       anesthesia professional. The following parameters were monitored: oxygen       saturation, heart rate, blood pressure, and response to care. Recommendation:           - Return patient to hospital ward for ongoing care.                           - Clear liquid diet.                           - Continue present medications.                           - Await pathology results.                           - No ibuprofen, naproxen, or other  non-steroidal                            anti-inflammatory drugs. Procedure Code(s):        --- Professional ---                           [phone], 59, Esophagogastroduodenoscopy, flexible,                            transoral; with control of bleeding, any method                           43239, Esophagogastroduodenoscopy, flexible,                            transoral; with biopsy, single or multiple Diagnosis Code(s):        --- Professional ---                           K20.90, Esophagitis, unspecified without bleeding                           K25.9, Gastric ulcer, unspecified as acute or                            chronic, without hemorrhage or perforation                           K92.2, Gastrointestinal hemorrhage, unspecified                           T39.395S, Adverse effect of other nonsteroidal                            anti-inflammatory drugs [NSAID], sequela                            K26.4, Chronic or unspecified duodenal ulcer with                            hemorrhage                           K26.9, Duodenal ulcer, unspecified as acute or                            chronic, without hemorrhage or perforation                           K92.1, Melena (includes Hematochezia) CPT copyright 2022 [employer]. All rights reserved. The codes documented in this report are preliminary and upon coder review may  be revised to meet current compliance requirements. Kathi Der, MD Kathi Der, MD 12/29/2021 1:59:26 PM Number of Addenda: 0

## 2021-12-29 NOTE — Progress Notes (Signed)
PROGRESS NOTE  BRONSEN SERANO  DOB: 20-Nov-1970  PCP: Barbie Banner, MD YCX:[id number]  DOA: 12/28/2021  LOS: 0 days  Hospital Day: 2  Brief narrative: Bryan [name] is a 51 y.o. male with PMH significant for morbid obesity DM2, HTN, ureteral stone, diverticulosis s/p partial colectomy, history of chronic low back pain.. 12/13, patient was brought to the ED from home after a fall. Patient states that he got up and felt weak in his legs and fell hitting the top of his head on a dresser. He was noted to have an abrasion to the top of his head. He denies any loss of consciousness.   At baseline he ambulates with a rolling walker.  He states that he has had several falls over the last several weeks. Denies any premonition symptoms prior to fall.  He thinks his legs just got weak and gave away.  In one of the falls around Thanksgiving, he sustained a nondisplaced right proximal fibula fracture for which he was prescribed a boot.  He was prescribed ibuprofen 800 mg 3 times daily which he has been taking as prescribed.  Stools have been dark but he denies having any hematemesis or hematochezia.  In the ED, patient was afebrile, heart rate in 80s, blood pressure 90s, breathing on room air Labs showed hemoglobin low at 8.9, BUN/creatinine elevated 38/1.51, sodium level low at 133 FOBT positive Admitted to [facility]  Subjective: Patient was seen and examined this morning.  Pleasant middle-aged Caucasian male.  Lying on bed.  Not in distress.  No family at bedside.   Seen by GI.  Tentative plan of EGD today.  Chart reviewed.  He had a temperature of 100.3 this morning with tachycardia to the low 100. Repeat labs this morning with hemoglobin further low at 8.3, creatinine low at 1.33 Blood sugar level over 200  Assessment and plan: Acute anemia GI bleeding Baseline hemoglobin 14.5 from September 2022 per Care Everywhere Presented with a low hemoglobin of 8.9, elevated BUN.  FOBT  positive. Reports using ibuprofen for pain control Has been started on IV Protonix twice daily GI consulted.  Tentative plan of EGD today. Recent Labs    12/28/21 1851 12/29/21 0526  HGB 8.9* 8.3*  MCV 93.3 91.5   Sepsis - POA Patient had a temperature of 100.6 this morning.  He has been running tachycardic in low 100s since last night.  WBC count up to 11.1 this morning.  Urinalysis on admission yesterday was nonsuggestive of infection.  Chest x-ray was unremarkable as well. With fever, tachycardia, elevated creatinine, he meets criteria for sepsis that evolved within 24 hours of presentation.  However no clear source at this time. Obtain blood culture and lactic acid level.  Start IV Rocephin empirically. Continue to monitor temperature, WBC trend. Recent Labs  Lab 12/28/21 1851 12/29/21 0526  WBC 7.5 11.1*   AKI Creatinine at baseline 1.07 in August 2023 per Care Everywhere.  Presented with creatinine elevated 1.51.  Likely because of combination of sepsis as well as regular use of NSAIDs. Monitor on IV hydration Recent Labs    12/28/21 1851 12/29/21 0526  BUN 38* 28*  CREATININE 1.51* 1.33*   Essential hypertension PTA on carvedilol 12.5 mg twice daily, amlodipine 10 mg daily, lisinopril 40 mg daily Currently blood pressure is running in low normal range.   I would resume Coreg at a lower dose of 3.125 mg twice daily.  Keep others on hold. Continue to monitor blood pressure  Uncontrolled type 2 diabetes mellitus Diabetic neuropathy A1c significantly elevated to 9 in August 2023 per care everywhere PTA on glipizide 10 mg twice daily, metformin 1000 mg twice daily, Actos 30 mg daily. Currently on sliding scale insulin with Accu-Cheks.  Blood pressure running elevated over 200.  I will resume glipizide and Actos today.  Keep metformin on hold. Continue gabapentin for neuropathy Recent Labs  Lab 12/28/21 2040 12/28/21 2345 12/29/21 0348 12/29/21 0816  GLUCAP 399*  301* 218* 228*   Generalized weakness Frequent falls Chronic back pain Patient has impaired mobility at baseline and uses a walker.  He has progressive generalized weakness and frequent falls recently.   He also had a nondisplaced proximal fibula fracture to his right lower leg and lacerations to his scalp. PT eval ordered  Goals of care   Code Status: Full Code    Mobility: Encourage ambulation.  PT eval  Scheduled Meds:  carvedilol  3.125 mg Oral BID WC   gabapentin  300 mg Oral q AM   gabapentin  600 mg Oral QHS   glipiZIDE  5 mg Oral Q breakfast   insulin aspart  0-15 Units Subcutaneous Q4H   pantoprazole (PROTONIX) IV  40 mg Intravenous Q12H   pioglitazone  30 mg Oral QPM    PRN meds: acetaminophen, HYDROcodone-acetaminophen, ondansetron **OR** ondansetron (ZOFRAN) IV   Infusions:   sodium chloride 100 mL/hr at 12/29/21 0855   cefTRIAXone (ROCEPHIN)  IV      Skin assessment:     Nutritional status:  There is no height or weight on file to calculate BMI.          Diet:  Diet Order             Diet NPO time specified  Diet effective now                   DVT prophylaxis:  SCDs Start: 12/28/21 2227   Antimicrobials: IV Rocephin Fluid: NS at 100 mill per hour Consultants: GI Family Communication: None at bedside  Status is: Observation  Continue in-hospital care because: Sepsis workup, IV antibiotics, GI procedure plan Level of care: Progressive   Dispo: The patient is from: Home              Anticipated d/c is to: Pending clinical course              Patient currently is not medically stable to d/c.   Difficult to place patient No    Antimicrobials: Anti-infectives (From admission, onward)    Start     Dose/Rate Route Frequency Ordered Stop   12/29/21 1030  cefTRIAXone (ROCEPHIN) 1 g in sodium chloride 0.9 % 100 mL IVPB        1 g 200 mL/hr over 30 Minutes Intravenous Every 24 hours 12/29/21 1028         Objective: Vitals:    12/29/21 0900 12/29/21 1017  BP: (!) 100/52   Pulse: (!) 106   Resp: 15   Temp:  (!) 100.6 F (38.1 C)  SpO2: 97%     Intake/Output Summary (Last 24 hours) at 12/29/2021 1031 Last data filed at 12/29/2021 0853 Gross per 24 hour  Intake 2975.42 ml  Output 1700 ml  Net 1275.42 ml   There were no vitals filed for this visit. Weight change:  There is no height or weight on file to calculate BMI.   Physical Exam: General exam: Pleasant, middle-aged male. Skin: No rashes, lesions or ulcers.  HEENT: Atraumatic, normocephalic, no obvious bleeding Lungs: Clear to auscultate bilaterally CVS: Sinus tachycardia, no murmur GI/Abd soft, distended from obesity, nontender, bowel sound present CNS: Alert, awake, oriented x 3 Psychiatry: Mood appropriate Extremities: No pedal edema, no calf tenderness.  Right leg in boot from recent fracture  Data Review: I have personally reviewed the laboratory data and studies available.  F/u labs ordered Unresulted Labs (From admission, onward)     Start     Ordered   12/29/21 1028  Culture, blood (Routine X 2) w Reflex to ID Panel  BLOOD CULTURE X 2,   R      12/29/21 1028   12/29/21 1028  Lactic acid, plasma  ONCE - STAT,   STAT        12/29/21 1028   12/29/21 1008  Hemoglobin and hematocrit, blood  Once-Timed,   TIMED        12/29/21 1007   12/28/21 2230  Hemoglobin A1c  Once,   R       Comments: To assess prior glycemic control    12/28/21 2229   12/28/21 2227  HIV Antibody (routine testing w rflx)  (HIV Antibody (Routine testing w reflex) panel)  Once,   R        12/28/21 2229   Unscheduled  CBC with Differential/Platelet  Tomorrow morning,   R        12/29/21 1031   Unscheduled  Basic metabolic panel  Tomorrow morning,   R        12/29/21 1031            Total time spent in review of labs and imaging, patient evaluation, formulation of plan, documentation and communication with family - 75 minutes  Signed, Lorin Glass, MD [facility] 12/29/2021

## 2021-12-30 ENCOUNTER — Encounter: Payer: Self-pay | Admitting: Internal Medicine

## 2021-12-30 DIAGNOSIS — R531 Weakness: Secondary | ICD-10-CM | POA: Diagnosis not present

## 2021-12-30 LAB — CBC WITH DIFFERENTIAL/PLATELET
Abs Immature Granulocytes: 0.08 10*3/uL — ABNORMAL HIGH (ref 0.00–0.07)
Basophils Absolute: 0.1 10*3/uL (ref 0.0–0.1)
Basophils Relative: 1 %
Eosinophils Absolute: 0.2 10*3/uL (ref 0.0–0.5)
Eosinophils Relative: 2 %
HCT: 25.4 % — ABNORMAL LOW (ref 39.0–52.0)
Hemoglobin: 8.6 g/dL — ABNORMAL LOW (ref 13.0–17.0)
Immature Granulocytes: 1 %
Lymphocytes Relative: 16 %
Lymphs Abs: 1.5 10*3/uL (ref 0.7–4.0)
MCH: 32.2 pg (ref 26.0–34.0)
MCHC: 33.9 g/dL (ref 30.0–36.0)
MCV: 95.1 fL (ref 80.0–100.0)
Monocytes Absolute: 0.6 10*3/uL (ref 0.1–1.0)
Monocytes Relative: 7 %
Neutro Abs: 7 10*3/uL (ref 1.7–7.7)
Neutrophils Relative %: 73 %
Platelets: 210 10*3/uL (ref 150–400)
RBC: 2.67 MIL/uL — ABNORMAL LOW (ref 4.22–5.81)
RDW: 16.9 % — ABNORMAL HIGH (ref 11.5–15.5)
WBC: 9.4 10*3/uL (ref 4.0–10.5)
nRBC: 0.4 % — ABNORMAL HIGH (ref 0.0–0.2)

## 2021-12-30 LAB — BASIC METABOLIC PANEL
Anion gap: 11 (ref 5–15)
BUN: 14 mg/dL (ref 6–20)
CO2: 24 mmol/L (ref 22–32)
Calcium: 8.7 mg/dL — ABNORMAL LOW (ref 8.9–10.3)
Chloride: 105 mmol/L (ref 98–111)
Creatinine, Ser: 1.02 mg/dL (ref 0.61–1.24)
GFR, Estimated: >60 mL/min (ref >60)
Glucose, Bld: 220 mg/dL — ABNORMAL HIGH (ref 70–99)
Potassium: 3.8 mmol/L (ref 3.5–5.1)
Sodium: 140 mmol/L (ref 135–145)

## 2021-12-30 LAB — HEMOGLOBIN A1C
Hgb A1c MFr Bld: >15.5 % — ABNORMAL HIGH (ref 4.8–5.6)
Mean Plasma Glucose: >398 mg/dL

## 2021-12-30 LAB — GLUCOSE, CAPILLARY
Glucose-Capillary: 201 mg/dL — ABNORMAL HIGH (ref 70–99)
Glucose-Capillary: 216 mg/dL — ABNORMAL HIGH (ref 70–99)
Glucose-Capillary: 223 mg/dL — ABNORMAL HIGH (ref 70–99)

## 2021-12-30 MED ORDER — INSULIN PEN NEEDLE 32G X 8 MM MISC: 0 refills | Status: DC

## 2021-12-30 MED ORDER — LANTUS SOLOSTAR 100 UNIT/ML ~~LOC~~ SOPN: 10.0000 [IU] | PEN_INJECTOR | Freq: Every day | SUBCUTANEOUS | 0 refills | Status: AC

## 2021-12-30 MED ORDER — BLOOD GLUCOSE MONITOR KIT: PACK | 0 refills | Status: DC

## 2021-12-30 MED ORDER — PANTOPRAZOLE SODIUM 40 MG PO TBEC: 40.0000 mg | DELAYED_RELEASE_TABLET | Freq: Two times a day (BID) | ORAL | 2 refills | Status: DC

## 2021-12-30 NOTE — Plan of Care (Signed)

## 2021-12-30 NOTE — Discharge Summary (Signed)
Physician Discharge Summary  Bryan [name] JKD:[id number] DOB: 03-15-1970 DOA: 12/28/2021  PCP: Bryan Sacramento, MD  Admit date: 12/28/2021 Discharge date: 12/30/2021  Admitted From: Home Discharge disposition: Home  Recommendations at discharge:  Continue Protonix twice daily till follow-up with GI  Stop NSAID Continue carvedilol.  Keep amlodipine and lisinopril on hold. You have been started on insulin.  Continue monitoring blood sugar level at home.  Further adjustment per PCP.  Brief narrative: Bryan [name] is a 51 y.o. male with PMH significant for morbid obesity DM2, HTN, ureteral stone, diverticulosis s/p partial colectomy, history of chronic low back pain.. 12/13, patient was brought to the ED from home after a fall. Patient states that he got up and felt weak in his legs and fell hitting the top of his head on a dresser. He was noted to have an abrasion to the top of his head. He denies any loss of consciousness.   At baseline he ambulates with a rolling walker.  He states that he has had several falls over the last several weeks. Denies any premonition symptoms prior to fall.  He thinks his legs just got weak and gave away.  In one of the falls around Thanksgiving, he sustained a nondisplaced right proximal fibula fracture for which he was prescribed a boot.  He was prescribed ibuprofen 800 mg 3 times daily which he has been taking as prescribed.  Stools have been dark but he denies having any hematemesis or hematochezia.  In the ED, patient was afebrile, heart rate in 80s, blood pressure 90s, breathing on room air Labs showed hemoglobin low at 8.9, BUN/creatinine elevated 38/1.51, sodium level low at 133 FOBT positive Admitted to [facility]  Subjective: Patient was seen and examined this morning.   Sitting up in recliner.  Not in distress.  Feels better.  Able to tolerate diet.   EGD findings from yesterday as below.   [name] recurrence of fever, hemodynamically  stable Blood sugar level is running elevated.  Assessment and plan: Acute anemia GI bleeding Baseline hemoglobin 14.5 from September 2022 per Care Everywhere Presented with a low hemoglobin of 8.9, elevated BUN.  FOBT positive. Reports using ibuprofen for pain control Patient was started on IV Protonix twice daily. GI was consulted. 12/14, underwent EGD.  8 showed blood clots and fresh blood in the duodenum.  After removing blood clot, a large 15 mm ulcer with visible vessel in the duodenal bulb was noted.  It was treated with 4 cc  epinephrine injection and gold probe cautery.  He was also noted to have mild esophagitis and small gastric ulcer.  Biopsy performed. Postprocedure, he was kept on Protonix drip for 24 hours. He was able to tolerate diet.  Hemoglobin stable Discharged home on oral Protonix twice daily.  Avoiding NSAIDs Follow-up with GI as an outpatient. Recent Labs    12/28/21 1851 12/29/21 0526 12/29/21 1016 12/30/21 0524  HGB 8.9* 8.3* 8.2* 8.6*  MCV 93.3 91.5  --  95.1   Fever- POA 12/14, patient had a temperature of 100.6.  He was also mildly tachycardic and had mildly elevated WBC count.   With suspected underlying infection, blood cultures were sent.  He was started on antibiotics. [name] growth on culture.  [name] recurrence of fever.  WBC count normalized.  I do not think it was an infection. Sepsis ruled out.  I do not think he needs antibiotics at discharge.  AKI Creatinine at baseline 1.07 in August 2023 per Care  Everywhere.  Presented with creatinine elevated 1.51.  Likely because of combination of sepsis as well as regular use of NSAIDs. With IV hydration, creatinine improved. Recent Labs    12/28/21 1851 12/29/21 0526 12/30/21 0524  BUN 38* 28* 14  CREATININE 1.51* 1.33* 1.02   Essential hypertension PTA on carvedilol 12.5 mg twice daily, amlodipine 10 mg daily, lisinopril 40 mg daily Blood pressure meds were held because of bleeding and  hypotension. Post discharge, I will resume Coreg but I do not think he needs amlodipine and lisinopril.  Will keep them on hold. Continue to monitor blood pressure at home.  Continue further adjustment under the supervision of PCP.     Uncontrolled type 2 diabetes mellitus Diabetic neuropathy A1c more than 15.5 on 12/13.   PTA on glipizide 10 mg twice daily, metformin 1000 mg twice daily, Actos 30 mg daily. Diabetes care coordinator consult appreciated. Patient apparently was having hypoglycemic symptoms at home with weakness, thirst, frequent urination.  He is willing to take insulin at home if needed.  He has a follow-up with his PCP Dr. Rayann Heman in January. Will discharge the patient home on Lantus 10 units daily Encouraged patient to check CBGs at least 3 times a day premeals and record in a log book for PCP to review. Encouraged patient to discuss with PCP about starting continuous glucose monitoring at home. Patient and family have been taught for using insulin pen. Recent Labs  Lab 12/29/21 1957 12/29/21 2334 12/30/21 0410 12/30/21 0750 12/30/21 1154  GLUCAP 278* 259* 201* 216* 223*   Generalized weakness Frequent falls Chronic back pain Patient has impaired mobility at baseline and uses a walker.  He has progressive generalized weakness and frequent falls recently.   He also had a nondisplaced proximal fibula fracture to his right lower leg and lacerations to his scalp. PT eval ordered  Wounds:  - Wound / Incision (Open or Dehisced) 09/02/14 Incision - Open Abdomen Mid pink and moist. new wet to dry dressing applied. (Active)  Date First Assessed/Time First Assessed: 09/02/14 2115   Wound Type: (c) Incision - Open  Location: Abdomen  Location Orientation: Mid  Wound Description (Comments): pink and moist. new wet to dry dressing applied.  Present on Admission: Yes    Assessments 09/02/2014  9:16 PM 09/07/2014 12:06 PM  Dressing Type Moist to dry --  Dressing Changed New --   Dressing Status Clean;Dry;Intact Clean;Dry;Intact  Dressing Change Frequency -- Twice a day  Site / Wound Assessment -- Dressing in place / Unable to assess     [name] associated orders.     Negative Pressure Wound Therapy Abdomen (Active)  Placement Date/Time: 09/07/14 1300   Wound Type: Surgical  Location: Abdomen    Assessments 09/07/2014  2:00 PM 09/09/2014 10:00 AM  Last dressing change 09/07/14 09/09/14  Site / Wound Assessment Clean Dressing in place / Unable to assess  Peri-wound Assessment Intact Intact;Other (Comment)  Wound filler - Black foam 1 1  Cycle Continuous Continuous  Target Pressure (mmHg) 125 125  Canister Changed Yes [name]  Dressing Status Intact Intact  Drainage Amount Scant Scant  Drainage Description Serosanguineous Serosanguineous  Output (mL) -- 50 mL     [name] associated orders.     Incision - 4 Ports Abdomen 1: Right;Mid 2: Left;Upper 3: Left;Mid 4: Left;Lower (Active)  Placement Date/Time: 11/02/15 1332   Location of Ports: Abdomen  Port: 1:  Location Orientation: Right;Mid  Port: 2:  Location Orientation: Left;Upper  Port: 3:  Location Orientation: Left;Mid  Port: 4:  Location Orientation: Left;Lower    Assessments 11/02/2015  1:54 PM 11/04/2015  8:40 AM  Port 1 Site Assessment -- [employer] 1 Drainage Amount None Scant  Port 1 Dressing Type Gauze (Comment);Transparent dressing Abdominal binders;Gauze (Comment);Transparent dressing  Port 1 Dressing Status Clean;Dry;Intact Clean;Dry;Intact  Port 2 Site Assessment -- Clean;Dry  Port 2 Drainage Amount None None  Port 2 Dressing Type Gauze (Comment);Transparent dressing Gauze (Comment);Abdominal binder;Transparent dressing  Port 2 Dressing Status Clean;Dry;Intact Clean;Dry;Intact  Port 3 Site Assessment -- Clean;Dry  Port 3 Drainage Amount -- None  Port 3 Dressing Type Gauze (Comment);Transparent dressing Abdominal binder;Gauze (Comment);Transparent dressing  Port 3 Dressing Status Clean;Dry;Intact  Clean;Dry;Intact  Port 4 Site Assessment -- Clean;Dry  Port 4 Drainage Amount None None  Port 4 Dressing Type Gauze (Comment) Abdominal binder;Gauze (Comment);Transparent dressing  Port 4 Dressing Status Clean;Dry;Intact Clean;Dry;Intact     [name] associated orders.     Incision (Closed) 11/02/15 Abdomen Other (Comment) (Active)  Date First Assessed/Time First Assessed: 11/02/15 1338   Location: Abdomen  Location Orientation: Other (Comment)    Assessments 11/02/2015  1:54 PM 11/04/2015  8:40 AM  Dressing Type -- Gauze (Comment);Transparent dressing;Abdominal binder  Dressing Clean;Dry;Intact --  Site / Wound Assessment -- Dressing in place / Unable to assess  Drainage Amount None --     [name] associated orders.     Incision (Closed) 06/08/17 Penis (Active)  Date First Assessed/Time First Assessed: 06/08/17 1421   Location: Penis    Assessments 06/08/2017  2:48 PM 06/08/2017  3:56 PM  Drainage Amount None None     [name] associated orders.    Discharge Exam:   Vitals:   12/29/21 2333 12/30/21 0410 12/30/21 0851 12/30/21 1258  BP: 121/78 129/88  109/75  Pulse: 99 (!) 104 (!) 102 99  Resp: _0 Temp: 99.1 F (37.3 C) 99.4 F (37.4 C)  99.2 F (37.3 C)  TempSrc: Oral Oral  Oral  SpO2: 98% 98%  96%    There is [name] height or weight on file to calculate BMI.  General exam: Pleasant, middle-aged male. Skin: [name] rashes, lesions or ulcers. HEENT: Atraumatic, normocephalic, [name] obvious bleeding Lungs: Clear to auscultation bilaterally CVS: Sinus tachycardia, [name] murmur GI/Abd soft, distended from obesity, nontender, bowel sound present CNS: Alert, awake, oriented x 3 Psychiatry: Mood appropriate Extremities: [name] pedal edema, [name] calf tenderness.  Right leg in boot from recent fracture  Follow ups:    Follow-up Information     Bryan Sacramento, MD Follow up.   Specialty: Family Medicine Contact information: [address] [place] [address]                  Discharge Instructions:   Discharge Instructions     Call MD for:  difficulty breathing, headache or visual disturbances   Complete by: As directed    Call MD for:  extreme fatigue   Complete by: As directed    Call MD for:  hives   Complete by: As directed    Call MD for:  persistant dizziness or light-headedness   Complete by: As directed    Call MD for:  persistant nausea and vomiting   Complete by: As directed    Call MD for:  severe uncontrolled pain   Complete by: As directed    Call MD for:  temperature >100.4   Complete by: As directed    Diet Carb Modified  Complete by: As directed    Diet general   Complete by: As directed    Discharge instructions   Complete by: As directed    Recommendations at discharge:   Continue Protonix twice daily till follow-up with GI   Stop NSAID  Continue carvedilol.  Keep amlodipine and lisinopril on hold.  You have been started on insulin.  Continue monitoring blood sugar level at home.  Further adjustment per PCP.  Discharge instructions for diabetes mellitus: Check blood sugar 3 times a day and bedtime at home. If blood sugar running above 200 or less than 70 please call your MD to adjust insulin. If you notice signs and symptoms of hypoglycemia (low blood sugar) like jitteriness, confusion, thirst, tremor and sweating, please check blood sugar, drink sugary drink/biscuits/sweets to increase sugar level and call MD or return to ER.    General discharge instructions: Follow with Primary MD Bryan Sacramento, MD in 7 days  Please request your PCP  to go over your hospital tests, procedures, radiology results at the follow up. Please get your medicines reviewed and adjusted.  Your PCP may decide to repeat certain labs or tests as needed. Do not drive, operate heavy machinery, perform activities at heights, swimming or participation in water activities or provide baby sitting services if your were admitted for syncope or siezures  until you have seen by Primary MD or a Neurologist and advised to do so again. [place] Controlled Substance Reporting System database was reviewed. Do not drive, operate heavy machinery, perform activities at heights, swim, participate in water activities or provide baby-sitting services while on medications for pain, sleep and mood until your outpatient physician has reevaluated you and advised to do so again.  You are strongly recommended to comply with the dose, frequency and duration of prescribed medications. Activity: As tolerated with Full fall precautions use walker/cane & assistance as needed Avoid using any recreational substances like cigarette, tobacco, alcohol, or non-prescribed drug. If you experience worsening of your admission symptoms, develop shortness of breath, life threatening emergency, suicidal or homicidal thoughts you must seek medical attention immediately by calling 911 or calling your MD immediately  if symptoms less severe. You must read complete instructions/literature along with all the possible adverse reactions/side effects for all the medicines you take and that have been prescribed to you. Take any new medicine only after you have completely understood and accepted all the possible adverse reactions/side effects.  Wear Seat belts while driving. You were cared for by a hospitalist during your hospital stay. If you have any questions about your discharge medications or the care you received while you were in the hospital after you are discharged, you can call the unit and ask to speak with the hospitalist or the covering physician. Once you are discharged, your primary care physician will handle any further medical issues. Please note that [name] REFILLS for any discharge medications will be authorized once you are discharged, as it is imperative that you return to your primary care physician (or establish a relationship with a primary care physician if you do not have  one).   Increase activity slowly   Complete by: As directed        Discharge Medications:   Allergies as of 12/30/2021       Reactions   Adhesive [tape] Hives, Other (See Comments)   [name] PLASTIC tape, please!!        Medication List     STOP taking these medications  amLODipine 10 MG tablet Commonly known as: NORVASC   ibuprofen 800 MG tablet Commonly known as: ADVIL   lisinopril 40 MG tablet Commonly known as: ZESTRIL   nabumetone 750 MG tablet Commonly known as: RELAFEN       TAKE these medications    acetaminophen 325 MG tablet Commonly known as: TYLENOL Take 2 tablets (650 mg total) by mouth every 6 (six) hours as needed for mild pain (or temp > 100). What changed: reasons to take this   albuterol 108 (90 Base) MCG/ACT inhaler Commonly known as: VENTOLIN HFA Inhale 2 puffs into the lungs every 6 (six) hours as needed for wheezing or shortness of breath.   blood glucose meter kit and supplies Kit Dispense based on patient and insurance preference. Use up to four times daily as directed.   carvedilol 12.5 MG tablet Commonly known as: COREG Take 12.5 mg by mouth in the morning and at bedtime.   cyclobenzaprine 10 MG tablet Commonly known as: FLEXERIL Take 10 mg by mouth 3 (three) times daily.   fluticasone 50 MCG/ACT nasal spray Commonly known as: FLONASE SPRAY 2 SPRAYS INTO EACH NOSTRIL EVERY DAY What changed: See the new instructions.   gabapentin 300 MG capsule Commonly known as: NEURONTIN Take 300 mg by mouth in the morning.   gabapentin 600 MG tablet Commonly known as: NEURONTIN Take 600 mg by mouth at bedtime.   glipiZIDE 10 MG 24 hr tablet Commonly known as: GLUCOTROL XL Take 10 mg by mouth 2 (two) times daily with a meal.   HYDROcodone-acetaminophen 5-325 MG tablet Commonly known as: NORCO/VICODIN Take 1 tablet by mouth in the morning and at bedtime.   Insulin Pen Needle 32G X 8 MM Misc Use as directed   Lantus SoloStar 100  UNIT/ML Solostar Pen Generic drug: insulin glargine Inject 10 Units into the skin daily.   metFORMIN 1000 MG tablet Commonly known as: GLUCOPHAGE Take 1,000 mg by mouth in the morning and at bedtime. What changed: Another medication with the same name was removed. Continue taking this medication, and follow the directions you see here.   minocycline 50 MG capsule Commonly known as: MINOCIN Take 50 mg by mouth daily as needed (for acne breakouts).   pantoprazole 40 MG tablet Commonly known as: PROTONIX Take 1 tablet (40 mg total) by mouth 2 (two) times daily before a meal. Switch for any other PPI at similar dose and frequency   pioglitazone 30 MG tablet Commonly known as: ACTOS Take 30 mg by mouth every evening.   rosuvastatin 40 MG tablet Commonly known as: CRESTOR Take 20 mg by mouth daily.   tiZANidine 4 MG tablet Commonly known as: ZANAFLEX Take 4 mg by mouth every 8 (eight) hours as needed for muscle spasms.         The results of significant diagnostics from this hospitalization (including imaging, microbiology, ancillary and laboratory) are listed below for reference.    Procedures and Diagnostic Studies:   DG Chest 2 View  Result Date: 12/28/2021 CLINICAL DATA:  Cough, pneumonia. EXAM: CHEST - 2 VIEW COMPARISON:  Chest x-ray 09/02/2014 FINDINGS: The heart size and mediastinal contours are within normal limits. Both lungs are clear. The visualized skeletal structures are unremarkable. IMPRESSION: [name] active cardiopulmonary disease. Electronically Signed   By: Ronney Asters M.D.   On: 12/28/2021 16:21   CT Head Wo Contrast  Result Date: 12/28/2021 CLINICAL DATA:  Trauma, fall EXAM: CT HEAD WITHOUT CONTRAST TECHNIQUE: Contiguous axial images were obtained from  the base of the skull through the vertex without intravenous contrast. RADIATION DOSE REDUCTION: This exam was performed according to the departmental dose-optimization program which includes automated exposure  control, adjustment of the mA and/or kV according to patient size and/or use of iterative reconstruction technique. COMPARISON:  10/22/2017 FINDINGS: Brain: [name] acute intracranial findings are seen. There are [name] signs of bleeding within the cranium. Ventricles are not dilated. Cortical sulci are prominent. There is [name] focal edema or mass effect. Vascular: Unremarkable. Skull: [name] fracture is seen in calvarium. There is 10 mm soft tissue density structure in subcutaneous plane in the left parietal scalp, possibly sebaceous cysts. Sinuses/Orbits: There is air-fluid level in left side sphenoid sinus. Other: None. IMPRESSION: [name] acute intracranial findings are seen in noncontrast CT brain. There is air-fluid level in left side of sphenoid sinus, possibly suggesting incidental sinusitis. Electronically Signed   By: Elmer Picker M.D.   On: 12/28/2021 16:15     Labs:   Basic Metabolic Panel: Recent Labs  Lab 12/28/21 1851 12/29/21 0526 12/30/21 0524  NA 133* 138 140  K 4.7 4.0 3.8  CL 101 105 105  CO2 _0 GLUCOSE 482* 230* 220*  BUN 38* 28* 14  CREATININE 1.51* 1.33* 1.02  CALCIUM 8.7* 8.7* 8.7*   GFR CrCl cannot be calculated (Unknown ideal weight.). Liver Function Tests: [name] results for input(s): "AST", "ALT", "ALKPHOS", "BILITOT", "PROT", "ALBUMIN" in the last 168 hours. [name] results for input(s): "LIPASE", "AMYLASE" in the last 168 hours. [name] results for input(s): "AMMONIA" in the last 168 hours. Coagulation profile [name] results for input(s): "INR", "PROTIME" in the last 168 hours.  CBC: Recent Labs  Lab 12/28/21 1851 12/29/21 0526 12/29/21 1016 12/30/21 0524  WBC 7.5 11.1*  --  9.4  NEUTROABS 5.5  --   --  7.0  HGB 8.9* 8.3* 8.2* 8.6*  HCT 26.3* 25.9* 25.0* 25.4*  MCV 93.3 91.5  --  95.1  PLT 217 213  --  210   Cardiac Enzymes: [name] results for input(s): "CKTOTAL", "CKMB", "CKMBINDEX", "TROPONINI" in the last 168 hours. BNP: Invalid input(s): "POCBNP" CBG: Recent  Labs  Lab 12/29/21 1957 12/29/21 2334 12/30/21 0410 12/30/21 0750 12/30/21 1154  GLUCAP 278* 259* 201* 216* 223*   D-Dimer [name] results for input(s): "DDIMER" in the last 72 hours. Hgb A1c Recent Labs    12/28/21 2319  HGBA1C >15.5*   Lipid Profile [name] results for input(s): "CHOL", "HDL", "LDLCALC", "TRIG", "CHOLHDL", "LDLDIRECT" in the last 72 hours. Thyroid function studies [name] results for input(s): "TSH", "T4TOTAL", "T3FREE", "THYROIDAB" in the last 72 hours.  Invalid input(s): "FREET3" Anemia work up [name] results for input(s): "VITAMINB12", "FOLATE", "FERRITIN", "TIBC", "IRON", "RETICCTPCT" in the last 72 hours. Microbiology Recent Results (from the past 240 hour(s))  Culture, blood (Routine X 2) w Reflex to ID Panel     Status: None (Preliminary result)   Collection Time: 12/29/21 10:52 AM   Specimen: BLOOD  Result Value Ref Range Status   Specimen Description   Final    BLOOD LEFT ANTECUBITAL Performed at [facility] [address], [place], [place] [address]    Special Requests   Final    BOTTLES DRAWN AEROBIC AND ANAEROBIC Blood Culture results may not be optimal due to an excessive volume of blood received in culture bottles Performed at [facility] [address] [place], [place] [address]    Culture   Final    [name] GROWTH < 24  HOURS Performed at [facility], [place] [address], [place], [place] [address]    Report Status PENDING  Incomplete  Culture, blood (Routine X 2) w Reflex to ID Panel     Status: None (Preliminary result)   Collection Time: 12/29/21 11:00 AM   Specimen: BLOOD  Result Value Ref Range Status   Specimen Description   Final    BLOOD RIGHT ANTECUBITAL Performed at [facility] [address] [place], [place] [address]    Special Requests   Final    BOTTLES DRAWN AEROBIC AND ANAEROBIC Blood Culture adequate volume Performed at [facility] [address] [place], [place] [address]    Culture   Final    [name] GROWTH < 24 HOURS Performed at [facility] [address] [place], [place] [address]    Report Status PENDING  Incomplete    Time coordinating discharge: 75 minutes  Signed: Jasir Rother  [facility] 12/30/2021, 3:39 PM

## 2021-12-30 NOTE — Progress Notes (Signed)
  Transition of Care [facility]) Screening Note   Patient Details  Name: Bryan [name] Date of Birth: 12/19/1970   Transition of Care [facility]) CM/SW Contact:    Adrian Prows, RN Phone Number: 12/30/2021, 3:43 PM    Transition of Care Department [facility]) has reviewed patient and no TOC needs have been identified at this time. We will continue to monitor patient advancement through interdisciplinary progression rounds. If new patient transition needs arise, please place a TOC consult.

## 2021-12-30 NOTE — Evaluation (Signed)
Physical Therapy Evaluation Patient Details Name: Bryan [name] MRN: [id number] DOB: 06-Mar-1970 Today's Date: 12/30/2021  History of Present Illness  Pt is a 51 y/o M admitted on 12/28/21 after presenting from home with c/o a fall. Pt endorses multiple falls recently & after a fall around Thanksgiving pt was found to have nondisplaced R proximal fibula fx & was prescribed a boot. Pt found to have acute anemia & GI bleeding. Pt underwent EGD & found to have ulcer that was treated. PMH: morbid obesity, DM2, HTN, ureteral stone, diverticulosis s/p partial colectomy, chronic LBP  Clinical Impression  Pt seen for PT evaluation with pt reporting he lives with his mother in a 1 level home with ramped entry. Prior to R foot injury pt ambulated without AD but has since started using RW. Pt with hx of multiple falls since Thanksgiving. On this date, pt is mod I for bed mobility & able to transfer STS & ambulate with RW & supervision. Will continue to follow pt acutely to address balance to reduce fall risk prior to d/c home. Plan to administer DGI next session.   Recommendations for follow up therapy are one component of a multi-disciplinary discharge planning process, led by the attending physician.  Recommendations may be updated based on patient status, additional functional criteria and insurance authorization.  Follow Up Recommendations No PT follow up      Assistance Recommended at Discharge Intermittent Supervision/Assistance  Patient can return home with the following  A little help with walking and/or transfers;A little help with bathing/dressing/bathroom;Assistance with cooking/housework;Help with stairs or ramp for entrance;Assist for transportation    Equipment Recommendations None recommended by PT  Recommendations for Other Services       Functional Status Assessment Patient has had a recent decline in their functional status and demonstrates the ability to make significant  improvements in function in a reasonable and predictable amount of time.     Precautions / Restrictions Precautions Precautions: Fall Required Braces or Orthoses: Other Brace Other Brace: RLE boot Restrictions Weight Bearing Restrictions: Yes RLE Weight Bearing: Weight bearing as tolerated (in boot)      Mobility  Bed Mobility Overal bed mobility: Modified Independent             General bed mobility comments: supine>sit with HOB elevated    Transfers Overall transfer level: Needs assistance Equipment used: Rolling walker (2 wheels) Transfers: Sit to/from Stand Sit to Stand: Supervision           General transfer comment: extra time to power up to standing    Ambulation/Gait Ambulation/Gait assistance: Supervision Gait Distance (Feet): 125 Feet Assistive device: Rolling walker (2 wheels) Gait Pattern/deviations: Decreased step length - right, Decreased step length - left, Decreased dorsiflexion - right, Decreased dorsiflexion - left, Decreased stride length Gait velocity: decreased        Stairs            Wheelchair Mobility    Modified Rankin (Stroke Patients Only)       Balance Overall balance assessment: History of Falls   Sitting balance-Leahy Scale: Good     Standing balance support: Bilateral upper extremity supported, During functional activity, Reliant on assistive device for balance Standing balance-Leahy Scale: Fair                               Pertinent Vitals/Pain Pain Assessment Pain Assessment: No/denies pain    Home Living Family/patient expects to  be discharged to:: Private residence Living Arrangements: Parent (pt lives with his mother & brother (who is disabled0) Available Help at Discharge: Family Type of Home: House Home Access: Ramped entrance       Home Layout: One level Home Equipment: Agricultural consultant (2 wheels)      Prior Function Prior Level of Function : Independent/Modified  Independent;History of Falls (last six months)             Mobility Comments: Pt with recent hx of falls. Prior to RLE injury he was ambulatory without AD but has been using RW since.       Hand Dominance        Extremity/Trunk Assessment   Upper Extremity Assessment Upper Extremity Assessment: Overall WFL for tasks assessed    Lower Extremity Assessment Lower Extremity Assessment: Overall WFL for tasks assessed       Communication   Communication: No difficulties  Cognition Arousal/Alertness: Awake/alert Behavior During Therapy: WFL for tasks assessed/performed Overall Cognitive Status: Within Functional Limits for tasks assessed                                          General Comments      Exercises     Assessment/Plan    PT Assessment Patient needs continued PT services  PT Problem List Decreased balance;Decreased mobility       PT Treatment Interventions DME instruction;Gait training;Balance training;Therapeutic exercise;Neuromuscular re-education;Functional mobility training;Therapeutic activities;Patient/family education    PT Goals (Current goals can be found in the Care Plan section)  Acute Rehab PT Goals Patient Stated Goal: go home, get better PT Goal Formulation: With patient Time For Goal Achievement: 01/13/22 Potential to Achieve Goals: Good Additional Goals Additional Goal #1: Pt will score 19/24 on the DGI to demonstrate decreased fall risk.    Frequency Min 3X/week     Co-evaluation               AM-PAC PT "6 Clicks" Mobility  Outcome Measure Help needed turning from your back to your side while in a flat bed without using bedrails?: None Help needed moving from lying on your back to sitting on the side of a flat bed without using bedrails?: None Help needed moving to and from a bed to a chair (including a wheelchair)?: A Little Help needed standing up from a chair using your arms (e.g., wheelchair or bedside  chair)?: A Little Help needed to walk in hospital room?: A Little Help needed climbing 3-5 steps with a railing? : A Little 6 Click Score: 20    End of Session Equipment Utilized During Treatment:  (RLE boot) Activity Tolerance: Patient tolerated treatment well Patient left: in chair;with call bell/phone within reach;with family/visitor present Nurse Communication: Mobility status PT Visit Diagnosis: History of falling (Z91.81);Other abnormalities of gait and mobility (R26.89)    Time: 4035-2481 PT Time Calculation (min) (ACUTE ONLY): 12 min   Charges:   PT Evaluation $PT Eval Low Complexity: 1 Low          Aleda Grana, PT, DPT 12/30/21, 3:33 PM   Sandi Mariscal 12/30/2021, 3:31 PM

## 2021-12-30 NOTE — Inpatient Diabetes Management (Addendum)
Inpatient Diabetes Program Recommendations  AACE/ADA: New Consensus Statement on Inpatient Glycemic Control (2015)  Target Ranges:  Prepandial:   less than 140 mg/dL      Peak postprandial:   less than 180 mg/dL (1-2 hours)      Critically ill patients:  140 - 180 mg/dL    Latest Reference Range & Units 12/28/21 23:19  Hemoglobin A1C 4.8 - 5.6 % >15.5 (H)  >398 mg/dl  (H): Data is abnormally high  Latest Reference Range & Units 12/29/21 23:34 12/30/21 04:10 12/30/21 07:50  Glucose-Capillary 70 - 99 mg/dL 259 (H)  8 units Novolog  201 (H)  5 units Novolog  216 (H)  (H): Data is abnormally high    Home DM Meds: Glipizide XL 10 mg BID                              Metformin 1000 mg BID                              Actos 30 mg Daily   Current Orders: Novolog Moderate Correction Scale/ SSI (0-15 units) Q4 hours                             Actos 30 mg QPM                             Glipizide 5 mg Daily     PCP: [place] Last A1c was 9.2% (06/02/2021) at PCP visit Actos added at last PCP visit   Current A1c Pending= >15.5%--Will pt need Insulin for home?   MD- AM CBG remains >200 today, please consider starting Semglee 10 units Daily  If you decide to send pt home on Once daily basal insulin, please order Insulin Pens Lantus Insulin Pens- Order Number [id number] Insulin Pen Needles- Order Number [id number]   Addendum 2:30pm--Met w/ pt and sister at bedside to discuss current A1c of >15.5%.  Pt told me he takes all of his oral DM meds as prescribed.  Only checking CBGs once or twice per week and told me the meter often reads "High" signifying CBGs are likely >400 at home.  Has been having symptoms of Hyperglycemia (weakness, thirst, frequent urination).  Is willing to take insulin at home if needed.  Has follow up appt with PCP Dr. Rayann Heman in January.  Explained what an A1c is and what it measures.  Reminded patient that his goal A1c is 7% or less per ADA standards to  prevent both acute and long-term complications.  Explained to patient the extreme importance of good glucose control at home.  Encouraged patient to check his CBGs at least TID AC at home (and occasionally 2 hours after meals) and to record all CBGs in a logbook for his PCP to review.  Reviewed goal CBGs for home.  We also discussed options for CGM at home (Dexcom G7 and [employer] 3)--I explained what CGM is and how it helps with more frequent monitoring of sugars at home.  Discussed w/ pt and sister that I will reach out to Attending Dr. Pietro Cassis and see if Dr. Pietro Cassis would be willing to start pt on once daily long-acting insulin and continue pt's oral meds.  Explained what long-acting (basal) insulin is and how it works, timing,  etc.  Pt knows he will need to follow up closely with PCP for repeat A1c in at least 3 months.  Discussed w/ pt and sister that if the Attending does not want to add insulin for home, that I strongly recommend pt call his PCP after d/c to review A1c and see if PCP wants to start insulin for pt at home.  Also encouraged pt to ask PCP about starting CGM for home (pt may need Prior Auth prior to starting).  Educated patient on insulin pen use at home.  Reviewed all steps of insulin pen including attachment of needle, 2-unit air shot, dialing up dose, giving injection, rotation of injection sites, removing needle, disposal of sharps, storage of unused insulin, disposal of insulin etc.  Patient able to provide successful return demonstration.  Reviewed troubleshooting with insulin pen.  Also reviewed Signs/Symptoms of Hypoglycemia with patient and how to treat Hypoglycemia at home.      --Will follow patient during hospitalization--  Wyn Quaker RN, MSN, [place] Diabetes Coordinator Inpatient Glycemic Control Team Team Pager: [phone] (8a-5p)

## 2021-12-30 NOTE — Progress Notes (Signed)
[facility] Gastroenterology Progress Note  Bryan [name] 51 y.o. [birth year]  CC: GI bleed   Subjective: Patient seen and examined at bedside.  Denies any further bleeding.  Overall feeling better.  Tolerating diet.  ROS : Afebrile, negative for chest pain and shortness of breath   Objective: Vital signs in last 24 hours: Vitals:   12/30/21 0851 12/30/21 1258  BP:  109/75  Pulse: (!) 102 99  Resp:  18  Temp:  99.2 F (37.3 C)  SpO2:  96%    Physical Exam:  General:  Alert, cooperative, no distress, appears stated age  Head:  Normocephalic, without obvious abnormality, atraumatic  Eyes:  , EOM's intact,   Lungs:   Clear to auscultation bilaterally, respirations unlabored  Heart:  Regular rate and rhythm, S1, S2 normal  Abdomen:   Soft, non-tender, bowel sounds active all four quadrants,  no masses,   Neuro Alert and oriented x 3  Psych Mood and affect normal    Lab Results: Recent Labs    12/29/21 0526 12/30/21 0524  NA 138 140  K 4.0 3.8  CL 105 105  CO2 25 24  GLUCOSE 230* 220*  BUN 28* 14  CREATININE 1.33* 1.02  CALCIUM 8.7* 8.7*   No results for input(s): "AST", "ALT", "ALKPHOS", "BILITOT", "PROT", "ALBUMIN" in the last 72 hours. Recent Labs    12/28/21 1851 12/29/21 0526 12/29/21 1016 12/30/21 0524  WBC 7.5 11.1*  --  9.4  NEUTROABS 5.5  --   --  7.0  HGB 8.9* 8.3* 8.2* 8.6*  HCT 26.3* 25.9* 25.0* 25.4*  MCV 93.3 91.5  --  95.1  PLT 217 213  --  210   No results for input(s): "LABPROT", "INR" in the last 72 hours.    Assessment/Plan: -GI bleed and anemia.  EGD yesterday showed large duodenal bulb ulcer with visible vessel and blood clot.  Endoscopic treatment with epinephrine injection and gold probe cautery was performed after removing blood clot.  Hemoglobin stable. -Recurrent fall and significant NSAID use for pain. -Diabetes  Recommendations -------------------------- -Recommend Protonix 40 mg twice a day at least for 8 weeks  followed by Protonix 40 mg once a day for another 4 weeks. -Avoid NSAIDs.  Avoid smoking.  Avoid alcohol. -Risk of recurrent bleeding from large ulcer discussed with patient and patient's sister at bedside. -Okay to discharge from GI standpoint.  Follow-up with primary GI at [facility] medical after discharge.  GI will sign off.  Call us back if needed.   Kathi Der MD, FACP 12/30/2021, 3:32 PM  Contact #  [phone]

## 2021-12-30 NOTE — Anesthesia Postprocedure Evaluation (Signed)
Anesthesia Post Note  Patient: Bryan [name]  Procedure(s) Performed: ESOPHAGOGASTRODUODENOSCOPY (EGD) WITH PROPOFOL SCLEROTHERAPY BIOPSY HOT HEMOSTASIS (ARGON PLASMA COAGULATION/BICAP)     Patient location during evaluation: Endoscopy Anesthesia Type: MAC Level of consciousness: awake and alert Pain management: pain level controlled Vital Signs Assessment: post-procedure vital signs reviewed and stable Respiratory status: spontaneous breathing, nonlabored ventilation and respiratory function stable Cardiovascular status: stable and blood pressure returned to baseline Postop Assessment: no apparent nausea or vomiting Anesthetic complications: no  No notable events documented.  Last Vitals:  Vitals:   12/30/21 0851 12/30/21 1258  BP:  109/75  Pulse: (!) 102 99  Resp:  18  Temp:  37.3 C  SpO2:  96%    Last Pain:  Vitals:   12/30/21 1258  TempSrc: Oral  PainSc:                  Virl Kieu Quiggle

## 2022-01-01 ENCOUNTER — Encounter (HOSPITAL_COMMUNITY): Payer: Self-pay | Admitting: Gastroenterology

## 2022-01-03 LAB — CULTURE, BLOOD (ROUTINE X 2)
Culture: NO GROWTH
Culture: NO GROWTH
Special Requests: ADEQUATE

## 2022-01-03 LAB — SURGICAL PATHOLOGY

## 2022-10-02 ENCOUNTER — Encounter (HOSPITAL_BASED_OUTPATIENT_CLINIC_OR_DEPARTMENT_OTHER): Payer: Self-pay

## 2022-10-02 ENCOUNTER — Emergency Department (HOSPITAL_BASED_OUTPATIENT_CLINIC_OR_DEPARTMENT_OTHER)
Admission: EM | Admit: 2022-10-02 | Discharge: 2022-10-02 | Disposition: A | Discharge status: Home / Self Care | Payer: Medicaid Other | Attending: Emergency Medicine | Admitting: Emergency Medicine

## 2022-10-02 ENCOUNTER — Other Ambulatory Visit: Payer: Self-pay

## 2022-10-02 DIAGNOSIS — E871 Hypo-osmolality and hyponatremia: Secondary | ICD-10-CM | POA: Diagnosis not present

## 2022-10-02 DIAGNOSIS — D72829 Elevated white blood cell count, unspecified: Secondary | ICD-10-CM | POA: Insufficient documentation

## 2022-10-02 DIAGNOSIS — Z79899 Other long term (current) drug therapy: Secondary | ICD-10-CM | POA: Diagnosis not present

## 2022-10-02 DIAGNOSIS — R000 Tachycardia, unspecified: Secondary | ICD-10-CM | POA: Insufficient documentation

## 2022-10-02 DIAGNOSIS — R739 Hyperglycemia, unspecified: Secondary | ICD-10-CM | POA: Diagnosis present

## 2022-10-02 DIAGNOSIS — Z7984 Long term (current) use of oral hypoglycemic drugs: Secondary | ICD-10-CM | POA: Diagnosis not present

## 2022-10-02 LAB — CBC
HCT: 44.7 % (ref 39.0–52.0)
Hemoglobin: 15.5 g/dL (ref 13.0–17.0)
MCH: 27.4 pg (ref 26.0–34.0)
MCHC: 34.7 g/dL (ref 30.0–36.0)
MCV: 79.1 fL — ABNORMAL LOW (ref 80.0–100.0)
Platelets: 317 10*3/uL (ref 150–400)
RBC: 5.65 MIL/uL (ref 4.22–5.81)
RDW: 17.5 % — ABNORMAL HIGH (ref 11.5–15.5)
WBC: 12.5 10*3/uL — ABNORMAL HIGH (ref 4.0–10.5)
nRBC: 0 % (ref 0.0–0.2)

## 2022-10-02 LAB — URINALYSIS, ROUTINE W REFLEX MICROSCOPIC
Bacteria, UA: NONE SEEN
Bilirubin Urine: NEGATIVE
Glucose, UA: >1000 mg/dL — AB
Hgb urine dipstick: NEGATIVE
Nitrite: NEGATIVE
Protein, ur: NEGATIVE mg/dL
Specific Gravity, Urine: 1.034 — ABNORMAL HIGH (ref 1.005–1.030)
pH: 5.5 (ref 5.0–8.0)

## 2022-10-02 LAB — BASIC METABOLIC PANEL
Anion gap: 16 — ABNORMAL HIGH (ref 5–15)
BUN: 10 mg/dL (ref 6–20)
CO2: 25 mmol/L (ref 22–32)
Calcium: 10.3 mg/dL (ref 8.9–10.3)
Chloride: 80 mmol/L — ABNORMAL LOW (ref 98–111)
Creatinine, Ser: 1.37 mg/dL — ABNORMAL HIGH (ref 0.61–1.24)
GFR, Estimated: >60 mL/min (ref >60)
Glucose, Bld: 605 mg/dL (ref 70–99)
Potassium: 4.3 mmol/L (ref 3.5–5.1)
Sodium: 121 mmol/L — ABNORMAL LOW (ref 135–145)

## 2022-10-02 LAB — CBG MONITORING, ED
Glucose-Capillary: >600 mg/dL (ref 70–99)
Glucose-Capillary: 582 mg/dL (ref 70–99)
Glucose-Capillary: 316 mg/dL — ABNORMAL HIGH (ref 70–99)
Glucose-Capillary: 319 mg/dL — ABNORMAL HIGH (ref 70–99)

## 2022-10-02 LAB — I-STAT VENOUS BLOOD GAS, ED
Acid-Base Excess: 1 mmol/L (ref 0.0–2.0)
Bicarbonate: 27.9 mmol/L (ref 20.0–28.0)
Calcium, Ion: 1.2 mmol/L (ref 1.15–1.40)
HCT: 45 % (ref 39.0–52.0)
Hemoglobin: 15.3 g/dL (ref 13.0–17.0)
O2 Saturation: 46 %
Potassium: 4 mmol/L (ref 3.5–5.1)
Sodium: 122 mmol/L — ABNORMAL LOW (ref 135–145)
TCO2: 29 mmol/L (ref 22–32)
pCO2, Ven: 54 mmHg (ref 44–60)
pH, Ven: 7.321 (ref 7.25–7.43)
pO2, Ven: 28 mmHg — CL (ref 32–45)

## 2022-10-02 MED ORDER — INSULIN REGULAR HUMAN 100 UNIT/ML IJ SOLN: 15.0000 [IU] | Freq: Once | INTRAMUSCULAR | Status: DC

## 2022-10-02 MED ORDER — MORPHINE SULFATE (PF) 4 MG/ML IV SOLN
4.0000 mg | Freq: Once | INTRAVENOUS | Status: AC
  Administered 2022-10-02: 4 mg via INTRAVENOUS
  Filled 2022-10-02: qty 1

## 2022-10-02 MED ORDER — INSULIN ASPART 100 UNIT/ML IJ SOLN
15.0000 [IU] | Freq: Once | INTRAMUSCULAR | Status: AC
  Administered 2022-10-02: 15 [IU] via INTRAVENOUS

## 2022-10-02 MED ORDER — LACTATED RINGERS IV BOLUS
1000.0000 mL | Freq: Once | INTRAVENOUS | Status: AC
  Administered 2022-10-02: 1000 mL via INTRAVENOUS

## 2022-10-02 MED ORDER — ONDANSETRON HCL 4 MG/2ML IJ SOLN
4.0000 mg | Freq: Once | INTRAMUSCULAR | Status: AC
  Administered 2022-10-02: 4 mg via INTRAVENOUS
  Filled 2022-10-02: qty 2

## 2022-10-02 MED ORDER — LACTATED RINGERS IV BOLUS
2000.0000 mL | Freq: Once | INTRAVENOUS | Status: AC
  Administered 2022-10-02: 2000 mL via INTRAVENOUS

## 2022-10-02 MED ORDER — INSULIN ASPART 100 UNIT/ML IJ SOLN
15.0000 [IU] | Freq: Once | INTRAMUSCULAR | Status: AC
  Administered 2022-10-02: 15 [IU] via SUBCUTANEOUS

## 2022-10-02 NOTE — ED Triage Notes (Signed)
Pt states hi blood sugar has been "over 300" for 3 -4 days. Pt has associated polyuria, polydipsia, abd pain, fatigue. Pt with Hx of Type 2 DM treated with metformin and insulin.

## 2022-10-02 NOTE — ED Notes (Signed)
Pt care taken, no complaints at this time. CBG 582

## 2022-10-02 NOTE — ED Provider Notes (Signed)
[place] EMERGENCY DEPARTMENT AT [facility] Provider Note   CSN: [id number] Arrival date & time: 10/02/22  1544     History  Chief Complaint  Patient presents with   Hyperglycemia    RUBLE WILDEN is a 52 y.o. male.  52 yo M with a chief complaints of high blood sugar.  The patient tells me that he has been having issue for about 4 or 5 days.  Increased urinary effort and felt like his stomach's been a bit upset.  No fevers no cough no congestion.  He thought maybe had a urinary tract infection because he was going to the bathroom so often.  He denies any recent change to his medications.   Hyperglycemia      Home Medications Prior to Admission medications   Medication Sig Start Date End Date Taking? Authorizing Provider  acetaminophen (TYLENOL) 325 MG tablet Take 2 tablets (650 mg total) by mouth every 6 (six) hours as needed for mild pain (or temp > 100). Patient taking differently: Take 650 mg by mouth every 6 (six) hours as needed for mild pain, fever or headache. 08/31/14   Nonie Hoyer, PA-C  albuterol (VENTOLIN HFA) 108 (90 Base) MCG/ACT inhaler Inhale 2 puffs into the lungs every 6 (six) hours as needed for wheezing or shortness of breath.    [provider]  blood glucose meter kit and supplies KIT Dispense based on patient and insurance preference. Use up to four times daily as directed. 12/30/21   Lorin Glass, MD  carvedilol (COREG) 12.5 MG tablet Take 12.5 mg by mouth in the morning and at bedtime.    [provider]  cyclobenzaprine (FLEXERIL) 10 MG tablet Take 10 mg by mouth 3 (three) times daily.    [provider]  fluticasone (FLONASE) 50 MCG/ACT nasal spray SPRAY 2 SPRAYS INTO EACH NOSTRIL EVERY DAY Patient taking differently: Place 2 sprays into both nostrils daily as needed for allergies or rhinitis. 01/06/19   Steffanie Dunn, DO  gabapentin (NEURONTIN) 300 MG capsule Take 300 mg by mouth in the morning.     [provider]  gabapentin (NEURONTIN) 600 MG tablet Take 600 mg by mouth at bedtime. 06/01/19   [provider]  glipiZIDE (GLUCOTROL XL) 10 MG 24 hr tablet Take 10 mg by mouth 2 (two) times daily with a meal.    [provider]  HYDROcodone-acetaminophen (NORCO/VICODIN) 5-325 MG tablet Take 1 tablet by mouth in the morning and at bedtime.    [provider]  insulin glargine (LANTUS SOLOSTAR) 100 UNIT/ML Solostar Pen Inject 10 Units into the skin daily. 12/30/21 03/30/22  Lorin Glass, MD  Insulin Pen Needle 32G X 8 MM MISC Use as directed 12/30/21   Lorin Glass, MD  metFORMIN (GLUCOPHAGE) 1000 MG tablet Take 1,000 mg by mouth in the morning and at bedtime.    [provider]  minocycline (MINOCIN,DYNACIN) 50 MG capsule Take 50 mg by mouth daily as needed (for acne breakouts).    [provider]  pantoprazole (PROTONIX) 40 MG tablet Take 1 tablet (40 mg total) by mouth 2 (two) times daily before a meal. Switch for any other PPI at similar dose and frequency 12/30/21 03/30/22  Dahal, Melina Schools, MD  pioglitazone (ACTOS) 30 MG tablet Take 30 mg by mouth every evening.    [provider]  rosuvastatin (CRESTOR) 40 MG tablet Take 20 mg by mouth daily.    [provider]  tiZANidine (ZANAFLEX) 4 MG  tablet Take 4 mg by mouth every 8 (eight) hours as needed for muscle spasms.    [provider]      Allergies    Adhesive [tape]    Review of Systems   Review of Systems  Physical Exam Updated Vital Signs BP 138/88   Pulse (!) 108   Temp 98.2 F (36.8 C)   Resp 18   Ht 5\' 10"  (1.778 m)   Wt 108.9 kg   SpO2 97%   BMI 34.44 kg/m  Physical Exam Vitals and nursing note reviewed.  Constitutional:      Appearance: He is well-developed.     Comments: BMI 35  HENT:     Head: Normocephalic and atraumatic.  Eyes:     Pupils: Pupils are equal, round, and reactive to light.  Neck:     Vascular: No JVD.   Cardiovascular:     Rate and Rhythm: Normal rate and regular rhythm.     Heart sounds: No murmur heard.    No friction rub. No gallop.  Pulmonary:     Effort: No respiratory distress.     Breath sounds: No wheezing.  Abdominal:     General: There is no distension.     Tenderness: There is no abdominal tenderness. There is no guarding or rebound.  Musculoskeletal:        General: Normal range of motion.     Cervical back: Normal range of motion and neck supple.  Skin:    Coloration: Skin is not pale.     Findings: No rash.  Neurological:     Mental Status: He is alert and oriented to person, place, and time.  Psychiatric:        Behavior: Behavior normal.     ED Results / Procedures / Treatments   Labs (all labs ordered are listed, but only abnormal results are displayed) Labs Reviewed  BASIC METABOLIC PANEL - Abnormal; Notable for the following components:      Result Value   Sodium 121 (*)    Chloride 80 (*)    Glucose, Bld 605 (*)    Creatinine, Ser 1.37 (*)    Anion gap 16 (*)    All other components within normal limits  CBC - Abnormal; Notable for the following components:   WBC 12.5 (*)    MCV 79.1 (*)    RDW 17.5 (*)    All other components within normal limits  URINALYSIS, ROUTINE W REFLEX MICROSCOPIC - Abnormal; Notable for the following components:   Color, Urine COLORLESS (*)    Specific Gravity, Urine 1.034 (*)    Glucose, UA >1,000 (*)    Ketones, ur TRACE (*)    Leukocytes,Ua MODERATE (*)    All other components within normal limits  CBG MONITORING, ED - Abnormal; Notable for the following components:   Glucose-Capillary >600 (*)    All other components within normal limits  I-STAT VENOUS BLOOD GAS, ED - Abnormal; Notable for the following components:   pO2, Ven 28 (*)    Sodium 122 (*)    All other components within normal limits  CBG MONITORING, ED - Abnormal; Notable for the following components:   Glucose-Capillary 582 (*)    All other  components within normal limits  CBG MONITORING, ED - Abnormal; Notable for the following components:   Glucose-Capillary 316 (*)    All other components within normal limits  CBG MONITORING, ED - Abnormal; Notable for the following components:   Glucose-Capillary 319 (*)  All other components within normal limits  BLOOD GAS, VENOUS    EKG None  Radiology No results found.  Procedures .Critical Care  Performed by: Melene Plan, DO Authorized by: Melene Plan, DO   Critical care provider statement:    Critical care time (minutes):  35   Critical care time was exclusive of:  Separately billable procedures and treating other patients   Critical care was time spent personally by me on the following activities:  Development of treatment plan with patient or surrogate, discussions with consultants, evaluation of patient's response to treatment, examination of patient, ordering and review of laboratory studies, ordering and review of radiographic studies, ordering and performing treatments and interventions, pulse oximetry, re-evaluation of patient's condition and review of old charts   Care discussed with: admitting provider       Medications Ordered in ED Medications  lactated ringers bolus 2,000 mL (0 mLs Intravenous Stopped 10/02/22 1901)  morphine (PF) 4 MG/ML injection 4 mg (4 mg Intravenous Given 10/02/22 1654)  ondansetron (ZOFRAN) injection 4 mg (4 mg Intravenous Given 10/02/22 1654)  insulin aspart (novoLOG) injection 15 Units (15 Units Subcutaneous Given 10/02/22 1815)  lactated ringers bolus 1,000 mL (0 mLs Intravenous Stopped 10/02/22 2203)  insulin aspart (novoLOG) injection 15 Units (15 Units Intravenous Given 10/02/22 2017)  insulin aspart (novoLOG) injection 15 Units (15 Units Intravenous Given 10/02/22 2201)    ED Course/ Medical Decision Making/ A&P                                 Medical Decision Making Amount and/or Complexity of Data Reviewed Labs:  ordered.  Risk OTC drugs. Prescription drug management.   52 yo M with a cc of hyperglycemia.  Going on for the past 4 to 5 days.  Blood sugar here greater than 600.  No obvious inciting cause for his hyperglycemia.  Tells me he has been taking his medications regularly.  He did tell me he takes a shot every few days though on my record review supposed take Lantus daily.  Mildly tachycardic.  Will give IV fluids.  Check electrolytes.  Reassess.  Patient has hyperglycemia without acidosis.  His anion gap is slightly elevated.  He had trace ketones in his urine.  Pseudohyponatremia.  Mild leukocytosis.  No anemia.  Will attempt to lower his blood sugar here.  After multiple bolus doses of insulin and 3 bags of IV fluids the patient's blood sugar is now in the 300s.  10:21 PM:  I have discussed the diagnosis/risks/treatment options with the patient and family.  Evaluation and diagnostic testing in the emergency department does not suggest an emergent condition requiring admission or immediate intervention beyond what has been performed at this time.  They will follow up with PCP. We also discussed returning to the ED immediately if new or worsening sx occur. We discussed the sx which are most concerning (e.g., sudden worsening pain, fever, inability to tolerate by mouth) that necessitate immediate return. Medications administered to the patient during their visit and any new prescriptions provided to the patient are listed below.  Medications given during this visit Medications  lactated ringers bolus 2,000 mL (0 mLs Intravenous Stopped 10/02/22 1901)  morphine (PF) 4 MG/ML injection 4 mg (4 mg Intravenous Given 10/02/22 1654)  ondansetron (ZOFRAN) injection 4 mg (4 mg Intravenous Given 10/02/22 1654)  insulin aspart (novoLOG) injection 15 Units (15 Units Subcutaneous Given 10/02/22 1815)  lactated ringers bolus 1,000 mL (0 mLs Intravenous Stopped 10/02/22 2203)  insulin aspart (novoLOG) injection 15  Units (15 Units Intravenous Given 10/02/22 2017)  insulin aspart (novoLOG) injection 15 Units (15 Units Intravenous Given 10/02/22 2201)     The patient appears reasonably screen and/or stabilized for discharge and I doubt any other medical condition or other [facility] requiring further screening, evaluation, or treatment in the ED at this time prior to discharge.          Final Clinical Impression(s) / ED Diagnoses Final diagnoses:  Hyperglycemia    Rx / DC Orders ED Discharge Orders     None         Melene Plan, DO 10/02/22 2221

## 2022-10-02 NOTE — ED Notes (Signed)
CBG greater than 600, reported to Denice Bors, RN.

## 2022-10-02 NOTE — Discharge Instructions (Signed)
Please take your medications as prescribed.  Try to eat a low carbohydrate diet.  Call your family doctor in the morning and let them know that your blood sugar has not been well-controlled.  See what they want to do to change your regimen.  They may need to start you on insulin.

## 2023-03-11 ENCOUNTER — Other Ambulatory Visit: Payer: Self-pay

## 2023-03-11 ENCOUNTER — Emergency Department (HOSPITAL_BASED_OUTPATIENT_CLINIC_OR_DEPARTMENT_OTHER): Payer: Medicaid Other

## 2023-03-11 ENCOUNTER — Encounter (HOSPITAL_BASED_OUTPATIENT_CLINIC_OR_DEPARTMENT_OTHER): Payer: Self-pay

## 2023-03-11 ENCOUNTER — Inpatient Hospital Stay (HOSPITAL_BASED_OUTPATIENT_CLINIC_OR_DEPARTMENT_OTHER)
Admission: EM | Admit: 2023-03-11 | Discharge: 2023-03-19 | DRG: 682 | Disposition: A | Discharge status: Home / Self Care | Payer: Medicaid Other | Attending: Family Medicine | Admitting: Family Medicine

## 2023-03-11 DIAGNOSIS — Z79899 Other long term (current) drug therapy: Secondary | ICD-10-CM

## 2023-03-11 DIAGNOSIS — R578 Other shock: Secondary | ICD-10-CM | POA: Diagnosis not present

## 2023-03-11 DIAGNOSIS — N170 Acute kidney failure with tubular necrosis: Secondary | ICD-10-CM | POA: Diagnosis present

## 2023-03-11 DIAGNOSIS — G8929 Other chronic pain: Secondary | ICD-10-CM | POA: Diagnosis present

## 2023-03-11 DIAGNOSIS — R571 Hypovolemic shock: Secondary | ICD-10-CM | POA: Diagnosis present

## 2023-03-11 DIAGNOSIS — N179 Acute kidney failure, unspecified: Principal | ICD-10-CM | POA: Diagnosis present

## 2023-03-11 DIAGNOSIS — E860 Dehydration: Secondary | ICD-10-CM | POA: Diagnosis present

## 2023-03-11 DIAGNOSIS — M545 Low back pain, unspecified: Secondary | ICD-10-CM | POA: Diagnosis present

## 2023-03-11 DIAGNOSIS — Z794 Long term (current) use of insulin: Secondary | ICD-10-CM

## 2023-03-11 DIAGNOSIS — E1122 Type 2 diabetes mellitus with diabetic chronic kidney disease: Secondary | ICD-10-CM | POA: Diagnosis present

## 2023-03-11 DIAGNOSIS — E785 Hyperlipidemia, unspecified: Secondary | ICD-10-CM | POA: Diagnosis present

## 2023-03-11 DIAGNOSIS — Z8249 Family history of ischemic heart disease and other diseases of the circulatory system: Secondary | ICD-10-CM

## 2023-03-11 DIAGNOSIS — Z7984 Long term (current) use of oral hypoglycemic drugs: Secondary | ICD-10-CM | POA: Diagnosis not present

## 2023-03-11 DIAGNOSIS — N10 Acute tubulo-interstitial nephritis: Secondary | ICD-10-CM | POA: Diagnosis present

## 2023-03-11 DIAGNOSIS — D649 Anemia, unspecified: Secondary | ICD-10-CM | POA: Diagnosis present

## 2023-03-11 DIAGNOSIS — G4733 Obstructive sleep apnea (adult) (pediatric): Secondary | ICD-10-CM | POA: Diagnosis present

## 2023-03-11 DIAGNOSIS — Z9049 Acquired absence of other specified parts of digestive tract: Secondary | ICD-10-CM

## 2023-03-11 DIAGNOSIS — E871 Hypo-osmolality and hyponatremia: Secondary | ICD-10-CM | POA: Diagnosis present

## 2023-03-11 DIAGNOSIS — E1159 Type 2 diabetes mellitus with other circulatory complications: Secondary | ICD-10-CM

## 2023-03-11 DIAGNOSIS — E1165 Type 2 diabetes mellitus with hyperglycemia: Secondary | ICD-10-CM | POA: Diagnosis present

## 2023-03-11 DIAGNOSIS — Z833 Family history of diabetes mellitus: Secondary | ICD-10-CM

## 2023-03-11 DIAGNOSIS — R579 Shock, unspecified: Secondary | ICD-10-CM | POA: Diagnosis not present

## 2023-03-11 DIAGNOSIS — Z1152 Encounter for screening for COVID-19: Secondary | ICD-10-CM

## 2023-03-11 DIAGNOSIS — I10 Essential (primary) hypertension: Secondary | ICD-10-CM | POA: Diagnosis not present

## 2023-03-11 DIAGNOSIS — E119 Type 2 diabetes mellitus without complications: Secondary | ICD-10-CM | POA: Diagnosis not present

## 2023-03-11 DIAGNOSIS — Z87442 Personal history of urinary calculi: Secondary | ICD-10-CM | POA: Diagnosis not present

## 2023-03-11 DIAGNOSIS — I700 Atherosclerosis of aorta: Secondary | ICD-10-CM | POA: Diagnosis present

## 2023-03-11 DIAGNOSIS — E861 Hypovolemia: Secondary | ICD-10-CM | POA: Diagnosis present

## 2023-03-11 DIAGNOSIS — E872 Acidosis, unspecified: Secondary | ICD-10-CM | POA: Diagnosis present

## 2023-03-11 DIAGNOSIS — I152 Hypertension secondary to endocrine disorders: Secondary | ICD-10-CM

## 2023-03-11 DIAGNOSIS — R609 Edema, unspecified: Secondary | ICD-10-CM | POA: Diagnosis not present

## 2023-03-11 DIAGNOSIS — I959 Hypotension, unspecified: Secondary | ICD-10-CM

## 2023-03-11 LAB — I-STAT VENOUS BLOOD GAS, ED
Acid-base deficit: 1 mmol/L (ref 0.0–2.0)
Bicarbonate: 25.5 mmol/L (ref 20.0–28.0)
Calcium, Ion: 1.02 mmol/L — ABNORMAL LOW (ref 1.15–1.40)
HCT: 38 % — ABNORMAL LOW (ref 39.0–52.0)
Hemoglobin: 12.9 g/dL — ABNORMAL LOW (ref 13.0–17.0)
O2 Saturation: 17 %
Potassium: 3.7 mmol/L (ref 3.5–5.1)
Sodium: 128 mmol/L — ABNORMAL LOW (ref 135–145)
TCO2: 27 mmol/L (ref 22–32)
pCO2, Ven: 51.1 mm[Hg] (ref 44–60)
pH, Ven: 7.305 (ref 7.25–7.43)
pO2, Ven: 15 mm[Hg] — CL (ref 32–45)

## 2023-03-11 LAB — CBC
HCT: 42.2 % (ref 39.0–52.0)
HCT: 35.1 % — ABNORMAL LOW (ref 39.0–52.0)
Hemoglobin: 14.5 g/dL (ref 13.0–17.0)
Hemoglobin: 12.4 g/dL — ABNORMAL LOW (ref 13.0–17.0)
MCH: 27.7 pg (ref 26.0–34.0)
MCH: 28.4 pg (ref 26.0–34.0)
MCHC: 34.4 g/dL (ref 30.0–36.0)
MCHC: 35.3 g/dL (ref 30.0–36.0)
MCV: 80.5 fL (ref 80.0–100.0)
MCV: 80.5 fL (ref 80.0–100.0)
Platelets: 247 10*3/uL (ref 150–400)
Platelets: 213 10*3/uL (ref 150–400)
RBC: 5.24 MIL/uL (ref 4.22–5.81)
RBC: 4.36 MIL/uL (ref 4.22–5.81)
RDW: 14.9 % (ref 11.5–15.5)
RDW: 14.2 % (ref 11.5–15.5)
WBC: 12.9 10*3/uL — ABNORMAL HIGH (ref 4.0–10.5)
WBC: 13.5 10*3/uL — ABNORMAL HIGH (ref 4.0–10.5)
nRBC: 0 % (ref 0.0–0.2)
nRBC: 0 % (ref 0.0–0.2)

## 2023-03-11 LAB — BASIC METABOLIC PANEL
Anion gap: 18 — ABNORMAL HIGH (ref 5–15)
Anion gap: 17 — ABNORMAL HIGH (ref 5–15)
BUN: 42 mg/dL — ABNORMAL HIGH (ref 6–20)
BUN: 40 mg/dL — ABNORMAL HIGH (ref 6–20)
CO2: 25 mmol/L (ref 22–32)
CO2: 19 mmol/L — ABNORMAL LOW (ref 22–32)
Calcium: 9 mg/dL (ref 8.9–10.3)
Calcium: 7.7 mg/dL — ABNORMAL LOW (ref 8.9–10.3)
Chloride: 83 mmol/L — ABNORMAL LOW (ref 98–111)
Chloride: 88 mmol/L — ABNORMAL LOW (ref 98–111)
Creatinine, Ser: 5.39 mg/dL — ABNORMAL HIGH (ref 0.61–1.24)
Creatinine, Ser: 4.64 mg/dL — ABNORMAL HIGH (ref 0.61–1.24)
GFR, Estimated: 12 mL/min — ABNORMAL LOW (ref >60)
GFR, Estimated: 14 mL/min — ABNORMAL LOW (ref >60)
Glucose, Bld: 221 mg/dL — ABNORMAL HIGH (ref 70–99)
Glucose, Bld: 316 mg/dL — ABNORMAL HIGH (ref 70–99)
Potassium: 3.6 mmol/L (ref 3.5–5.1)
Potassium: 3.5 mmol/L (ref 3.5–5.1)
Sodium: 126 mmol/L — ABNORMAL LOW (ref 135–145)
Sodium: 124 mmol/L — ABNORMAL LOW (ref 135–145)

## 2023-03-11 LAB — URINALYSIS, ROUTINE W REFLEX MICROSCOPIC
Bacteria, UA: NONE SEEN
Bilirubin Urine: NEGATIVE
Glucose, UA: >1000 mg/dL — AB
Hgb urine dipstick: NEGATIVE
Ketones, ur: NEGATIVE mg/dL
Leukocytes,Ua: NEGATIVE
Nitrite: NEGATIVE
Protein, ur: 30 mg/dL — AB
Specific Gravity, Urine: 1.026 (ref 1.005–1.030)
pH: 5 (ref 5.0–8.0)

## 2023-03-11 LAB — HEPATIC FUNCTION PANEL
ALT: 10 U/L (ref 0–44)
AST: 19 U/L (ref 15–41)
Albumin: 4.3 g/dL (ref 3.5–5.0)
Alkaline Phosphatase: 83 U/L (ref 38–126)
Bilirubin, Direct: 0.1 mg/dL (ref 0.0–0.2)
Indirect Bilirubin: 0.6 mg/dL (ref 0.3–0.9)
Total Bilirubin: 0.7 mg/dL (ref 0.0–1.2)
Total Protein: 8.1 g/dL (ref 6.5–8.1)

## 2023-03-11 LAB — LACTIC ACID, PLASMA
Lactic Acid, Venous: 1.9 mmol/L (ref 0.5–1.9)
Lactic Acid, Venous: 1.9 mmol/L (ref 0.5–1.9)
Lactic Acid, Venous: 2.1 mmol/L (ref 0.5–1.9)

## 2023-03-11 LAB — TSH: TSH: 1.055 u[IU]/mL (ref 0.350–4.500)

## 2023-03-11 LAB — TROPONIN I (HIGH SENSITIVITY)
Troponin I (High Sensitivity): 4 ng/L (ref <18)
Troponin I (High Sensitivity): 4 ng/L (ref <18)

## 2023-03-11 LAB — CK: Total CK: 118 U/L (ref 49–397)

## 2023-03-11 LAB — LIPASE, BLOOD: Lipase: 25 U/L (ref 11–51)

## 2023-03-11 LAB — RAPID URINE DRUG SCREEN, HOSP PERFORMED
Amphetamines: NOT DETECTED
Barbiturates: NOT DETECTED
Benzodiazepines: NOT DETECTED
Cocaine: NOT DETECTED
Opiates: POSITIVE — AB
Tetrahydrocannabinol: NOT DETECTED

## 2023-03-11 LAB — GLUCOSE, CAPILLARY: Glucose-Capillary: 324 mg/dL — ABNORMAL HIGH (ref 70–99)

## 2023-03-11 LAB — PHOSPHORUS: Phosphorus: 5.7 mg/dL — ABNORMAL HIGH (ref 2.5–4.6)

## 2023-03-11 LAB — PROCALCITONIN: Procalcitonin: 1.05 ng/mL

## 2023-03-11 LAB — BETA-HYDROXYBUTYRIC ACID: Beta-Hydroxybutyric Acid: 0.28 mmol/L — ABNORMAL HIGH (ref 0.05–0.27)

## 2023-03-11 LAB — RESP PANEL BY RT-PCR (RSV, FLU A&B, COVID)  RVPGX2
Influenza A by PCR: NEGATIVE
Influenza B by PCR: NEGATIVE
Resp Syncytial Virus by PCR: NEGATIVE
SARS Coronavirus 2 by RT PCR: NEGATIVE

## 2023-03-11 LAB — CBG MONITORING, ED: Glucose-Capillary: 229 mg/dL — ABNORMAL HIGH (ref 70–99)

## 2023-03-11 LAB — T4, FREE: Free T4: 0.93 ng/dL (ref 0.61–1.12)

## 2023-03-11 LAB — MAGNESIUM: Magnesium: 1.8 mg/dL (ref 1.7–2.4)

## 2023-03-11 LAB — HIV ANTIBODY (ROUTINE TESTING W REFLEX): HIV Screen 4th Generation wRfx: NONREACTIVE

## 2023-03-11 LAB — MRSA NEXT GEN BY PCR, NASAL: MRSA by PCR Next Gen: NOT DETECTED

## 2023-03-11 MED ORDER — SODIUM CHLORIDE 0.9 % IV BOLUS
1000.0000 mL | Freq: Once | INTRAVENOUS | Status: AC
  Administered 2023-03-11: 1000 mL via INTRAVENOUS

## 2023-03-11 MED ORDER — ORAL CARE MOUTH RINSE: 15.0000 mL | OROMUCOSAL | Status: DC | PRN

## 2023-03-11 MED ORDER — POTASSIUM CHLORIDE 20 MEQ PO PACK
20.0000 meq | PACK | Freq: Once | ORAL | Status: AC
  Administered 2023-03-11: 20 meq via ORAL
  Filled 2023-03-11: qty 1

## 2023-03-11 MED ORDER — SODIUM CHLORIDE 0.9 % IV BOLUS (SEPSIS)
1000.0000 mL | Freq: Once | INTRAVENOUS | Status: AC
  Administered 2023-03-11: 1000 mL via INTRAVENOUS

## 2023-03-11 MED ORDER — GABAPENTIN 600 MG PO TABS: 600.0000 mg | ORAL_TABLET | Freq: Every day | ORAL | Status: DC

## 2023-03-11 MED ORDER — ACETAMINOPHEN 325 MG PO TABS
650.0000 mg | ORAL_TABLET | Freq: Once | ORAL | Status: AC
  Administered 2023-03-11: 650 mg via ORAL
  Filled 2023-03-11: qty 2

## 2023-03-11 MED ORDER — POLYETHYLENE GLYCOL 3350 17 G PO PACK: 17.0000 g | PACK | Freq: Every day | ORAL | Status: DC | PRN

## 2023-03-11 MED ORDER — METRONIDAZOLE 500 MG/100ML IV SOLN
500.0000 mg | Freq: Once | INTRAVENOUS | Status: AC
  Administered 2023-03-11: 500 mg via INTRAVENOUS
  Filled 2023-03-11: qty 100

## 2023-03-11 MED ORDER — DOCUSATE SODIUM 100 MG PO CAPS: 100.0000 mg | ORAL_CAPSULE | Freq: Two times a day (BID) | ORAL | Status: DC | PRN

## 2023-03-11 MED ORDER — LACTATED RINGERS IV BOLUS
2000.0000 mL | Freq: Once | INTRAVENOUS | Status: AC
  Administered 2023-03-11: 2000 mL via INTRAVENOUS

## 2023-03-11 MED ORDER — INSULIN ASPART 100 UNIT/ML IJ SOLN
0.0000 [IU] | INTRAMUSCULAR | Status: DC
  Administered 2023-03-11: 7 [IU] via SUBCUTANEOUS
  Administered 2023-03-12: 3 [IU] via SUBCUTANEOUS
  Administered 2023-03-12 (×2): 2 [IU] via SUBCUTANEOUS

## 2023-03-11 MED ORDER — GABAPENTIN 300 MG PO CAPS: 300.0000 mg | ORAL_CAPSULE | Freq: Every morning | ORAL | Status: DC

## 2023-03-11 MED ORDER — VANCOMYCIN VARIABLE DOSE PER UNSTABLE RENAL FUNCTION (PHARMACIST DOSING): Status: DC

## 2023-03-11 MED ORDER — SODIUM CHLORIDE 0.9 % IV SOLN
2.0000 g | Freq: Once | INTRAVENOUS | Status: AC
  Administered 2023-03-11: 2 g via INTRAVENOUS
  Filled 2023-03-11: qty 12.5

## 2023-03-11 MED ORDER — LACTATED RINGERS IV BOLUS
500.0000 mL | Freq: Once | INTRAVENOUS | Status: AC
  Administered 2023-03-11: 500 mL via INTRAVENOUS

## 2023-03-11 MED ORDER — HYDROCODONE-ACETAMINOPHEN 5-325 MG PO TABS: 1.0000 | ORAL_TABLET | Freq: Two times a day (BID) | ORAL | Status: DC

## 2023-03-11 MED ORDER — HEPARIN SODIUM (PORCINE) 5000 UNIT/ML IJ SOLN
5000.0000 [IU] | Freq: Three times a day (TID) | INTRAMUSCULAR | Status: DC
  Administered 2023-03-11 – 2023-03-19 (×22): 5000 [IU] via SUBCUTANEOUS
  Filled 2023-03-11 (×22): qty 1

## 2023-03-11 MED ORDER — NOREPINEPHRINE 4 MG/250ML-% IV SOLN
0.0000 ug/min | INTRAVENOUS | Status: DC
  Administered 2023-03-11: 13 ug/min via INTRAVENOUS

## 2023-03-11 MED ORDER — NOREPINEPHRINE 4 MG/250ML-% IV SOLN
0.0000 ug/min | INTRAVENOUS | Status: DC
  Administered 2023-03-11: 2 ug/min via INTRAVENOUS
  Filled 2023-03-11 (×2): qty 250

## 2023-03-11 MED ORDER — ROSUVASTATIN CALCIUM 5 MG PO TABS
10.0000 mg | ORAL_TABLET | Freq: Every day | ORAL | Status: DC
Start: 2023-03-12 — End: 2023-03-19
  Administered 2023-03-12 – 2023-03-19 (×8): 10 mg via ORAL
  Filled 2023-03-11 (×9): qty 2

## 2023-03-11 MED ORDER — SODIUM CHLORIDE 0.9 % IV SOLN
2.0000 g | INTRAVENOUS | Status: DC
  Administered 2023-03-12: 2 g via INTRAVENOUS
  Filled 2023-03-11: qty 12.5

## 2023-03-11 MED ORDER — SODIUM CHLORIDE 0.9 % IV SOLN
250.0000 mL | INTRAVENOUS | Status: AC
  Administered 2023-03-11: 250 mL via INTRAVENOUS

## 2023-03-11 MED ORDER — VANCOMYCIN HCL 1250 MG/250ML IV SOLN
1250.0000 mg | Freq: Once | INTRAVENOUS | Status: DC
Start: 2023-03-11 — End: 2023-03-11
  Filled 2023-03-11: qty 250

## 2023-03-11 MED ORDER — VANCOMYCIN HCL IN DEXTROSE 1-5 GM/200ML-% IV SOLN
1000.0000 mg | Freq: Once | INTRAVENOUS | Status: AC
  Administered 2023-03-11: 1000 mg via INTRAVENOUS
  Filled 2023-03-11: qty 200

## 2023-03-11 MED ORDER — LACTATED RINGERS IV BOLUS
1000.0000 mL | Freq: Once | INTRAVENOUS | Status: AC
  Administered 2023-03-11: 1000 mL via INTRAVENOUS

## 2023-03-11 MED ORDER — HYDROCODONE-ACETAMINOPHEN 5-325 MG PO TABS
1.0000 | ORAL_TABLET | Freq: Four times a day (QID) | ORAL | Status: DC | PRN
  Administered 2023-03-11 – 2023-03-19 (×27): 1 via ORAL
  Filled 2023-03-11 (×27): qty 1

## 2023-03-11 MED ORDER — ROSUVASTATIN CALCIUM 20 MG PO TABS: 20.0000 mg | ORAL_TABLET | Freq: Every day | ORAL | Status: DC

## 2023-03-11 MED ORDER — LACTATED RINGERS IV SOLN: INTRAVENOUS | Status: DC

## 2023-03-11 MED ORDER — MAGNESIUM SULFATE IN D5W 1-5 GM/100ML-% IV SOLN
1.0000 g | Freq: Once | INTRAVENOUS | Status: AC
  Administered 2023-03-12: 1 g via INTRAVENOUS
  Filled 2023-03-11: qty 100

## 2023-03-11 MED ORDER — CHLORHEXIDINE GLUCONATE CLOTH 2 % EX PADS
6.0000 | MEDICATED_PAD | Freq: Every day | CUTANEOUS | Status: DC
  Administered 2023-03-11 – 2023-03-19 (×7): 6 via TOPICAL

## 2023-03-11 MED ORDER — LACTATED RINGERS IV SOLN: INTRAVENOUS | Status: AC

## 2023-03-11 NOTE — H&P (Addendum)
 NAME:  Bryan [name], MRN:  [id number], DOB:  1970-07-19, LOS: 0 ADMISSION DATE:  03/11/2023, CONSULTATION DATE:  2/23 REFERRING MD:  Maple Hudson, CHIEF COMPLAINT:  sepsis   History of Present Illness:  53 year old male with past medical history of hypertension, diabetes who presented to [facility] emergency department on 2/23 with complaint of fatigue, nausea and vomiting, reportedly not urinated since Friday 2/21.  Labs notable for Na 126, Cl 83, BUN 42, sCr 5.39, AG 18, WBC 12.9, resp panel negative, lactic 1.9, UA without UTI, troponin x2 negative. Presented afebrile, hypotensive, non-tachycardic. CXR with no abnormalities. CT AP without acute findings. Was given 3.5L IVF volume resuscitation, cefepime/vancomycin/flagyl. Bladder scan with only 200cc. With ongoing hypotension, was started on levophed and CCM consulted for admission.   On my exam patient endorses most of the story above. States that on Wednesday/Thursday he bent over and "fel pretty hard," hurting his back. Has a long history of chronic low back pain for which he takes oxycodone. States afterward he did not feel right. Then since Friday has had minimal to no urine output. He states he has been drinking normally, however. Had one episode N/V Saturday which has since resolved denies abdominal pain, chest pain, shortness of breath. No back surgery. No fevers at home.  Pertinent  Medical History  Hypertension, diabetes  Significant Hospital Events: Including procedures, antibiotic start and stop dates in addition to other pertinent events   2/23: ED for n/v/fatigue>severe AKI>ccm of admit   Interim History / Subjective:  Admit for unspecified shock on levophed. See above for subjective HPI  Objective   Blood pressure 90/66, pulse 77, temperature 98.7 F (37.1 C), temperature source Oral, resp. rate 16, height 5\' 10"  (1.778 m), weight 120.2 kg, SpO2 94%.        Intake/Output Summary (Last 24 hours) at 03/11/2023 2011 Last  data filed at 03/11/2023 1533 Gross per 24 hour  Intake 800.11 ml  Output --  Net 800.11 ml   Filed Weights   03/11/23 1110 03/11/23 1945  Weight: 108.9 kg 120.2 kg    Examination: General: middle aged male obese, laying in bed, no acute distress HENT: [place]/AT anicteric sclera, mildly dry mm Lungs: clear bilaterally, room air, respiration are even and unlabored Cardiovascular: s1s2, no murmur, rub, gallop, no JVD Abdomen: protuberant, non-distended  Extremities: no pitting edema Neuro: awake, alert, oriented and appropriate. Readily follows commands. TTP of low back. Sensation intact in extremities. Weakness in his legs L>R against gravity  GU: foley   Resolved Hospital Problem list    Assessment & Plan:  Shock, undifferentiated  Not clear shock picture. Fall last week with low back pain. Subsequent malaise, episode of vomiting, minimal UOP. Presents with significant AKI, hypotension. No clear source found on work up. UA negative, CXR without pneumonia. CT AP with no obvious source. Did have low grade 100 temp. WBC 12.9. lactic negative. Troponins flat and EKG without ischemia. Doubt PE. ?neurogenic component? Or home opiates/BP medications exaggerating presentation.  - repeat lactic on arrival  - f/u procal, CK, RVP 20 pathogen panel, urine culture  - f/u blood cultures  - echo stat  - vanc/cefepime for coverage  - 2L LR now  - con't IVF 125cc/hr LR  - con't levo for MAP > 65   Acute kidney injury  Appears baseline ~ 1.2-1.5. Presenting BUN 42, sCr 5.39. minimal to no UOP since 2/21. Bladder scan 200cc. CT AP without obstructive pathology. UA without UTI.  - f/u CK  -  foley placement  - trend bmp, mag, phos - replete elytes - strict I&O - Avoid nephrotoxic agents, renally dose medications - ensure adequate renal perfusion   Diabetes  R/o DKA  Urine with ketones, AG 18, BG 221, bicarb 25 - f/u BHB - f/u A1c  - ssi  - cbg q4h   Hypertension  Hyperlipidemia  - hold  antihypertensives in the setting of shock  - rosuvastatin 20mg  daily to start in AM  Best Practice (right click and "Reselect all SmartList Selections" daily)   Diet/type: NPO DVT prophylaxis: prophylactic heparin  Pressure ulcer(s): na GI prophylaxis: N/A Lines: Arterial Line and yes and it is still needed Foley:  Yes, and it is still needed Code Status:  full code Last date of multidisciplinary goals of care discussion [pending]  Labs   CBC: Recent Labs  Lab 03/11/23 1111 03/11/23 1234  WBC 12.9*  --   HGB 14.5 12.9*  HCT 42.2 38.0*  MCV 80.5  --   PLT 247  --     Basic Metabolic Panel: Recent Labs  Lab 03/11/23 1111 03/11/23 1234  NA 126* 128*  K 3.6 3.7  CL 83*  --   CO2 25  --   GLUCOSE 221*  --   BUN 42*  --   CREATININE 5.39*  --   CALCIUM 9.0  --    GFR: Estimated Creatinine Clearance: 20.1 mL/min (A) (by C-G formula based on SCr of 5.39 mg/dL (H)). Recent Labs  Lab 03/11/23 1111 03/11/23 1121 03/11/23 1500  WBC 12.9*  --   --   LATICACIDVEN  --  1.9 1.9    Liver Function Tests: Recent Labs  Lab 03/11/23 1111  AST 19  ALT 10  ALKPHOS 83  BILITOT 0.7  PROT 8.1  ALBUMIN 4.3   Recent Labs  Lab 03/11/23 1129  LIPASE 25   No results for input(s): "AMMONIA" in the last 168 hours.  ABG    Component Value Date/Time   HCO3 25.5 03/11/2023 1234   TCO2 27 03/11/2023 1234   ACIDBASEDEF 1.0 03/11/2023 1234   O2SAT 17 03/11/2023 1234     Coagulation Profile: No results for input(s): "INR", "PROTIME" in the last 168 hours.  Cardiac Enzymes: No results for input(s): "CKTOTAL", "CKMB", "CKMBINDEX", "TROPONINI" in the last 168 hours.  HbA1C: Hgb A1c MFr Bld  Date/Time Value Ref Range Status  12/28/2021 11:19 PM >15.5 (H) 4.8 - 5.6 % Final    Comment:    (NOTE)         Prediabetes: 5.7 - 6.4         Diabetes: >6.4         Glycemic control for adults with diabetes: <7.0   06/10/2019 01:09 PM 17.1 (H) 4.8 - 5.6 % Final    Comment:     (NOTE) Pre diabetes:          5.7%-6.4% Diabetes:              >6.4% Glycemic control for   <7.0% adults with diabetes     CBG: Recent Labs  Lab 03/11/23 1122  GLUCAP 229*    Review of Systems:   As above  Past Medical History:  He,  has a past medical history of Diabetes mellitus without complication (HCC), History of diverticulitis of colon (07/30/2014), Hypertension, and Right ureteral stone.   Surgical History:   Past Surgical History:  Procedure Laterality Date   APPENDECTOMY  1981   BIOPSY  12/29/2021  Procedure: BIOPSY;  Surgeon: Kathi Der, MD;  Location: [facility];  Service: Gastroenterology;;   CYSTOSCOPY/URETEROSCOPY/HOLMIUM LASER/STENT PLACEMENT Right 06/08/2017   Procedure: CYSTOSCOPY/RETROGRADE/URETEROSCOPY/HOLMIUM LASER/STENT PLACEMENT;  Surgeon: Rene Paci, MD;  Location: [facility];  Service: Urology;  Laterality: Right;  ONLY NEEDS 45 MIN   ESOPHAGOGASTRODUODENOSCOPY (EGD) WITH PROPOFOL N/A 12/29/2021   Procedure: ESOPHAGOGASTRODUODENOSCOPY (EGD) WITH PROPOFOL;  Surgeon: Kathi Der, MD;  Location: [facility];  Service: Gastroenterology;  Laterality: N/A;   HOT HEMOSTASIS N/A 12/29/2021   Procedure: HOT HEMOSTASIS (ARGON PLASMA COAGULATION/BICAP);  Surgeon: Kathi Der, MD;  Location: [facility];  Service: Gastroenterology;  Laterality: N/A;   ILEOSTOMY CLOSURE N/A 12/31/2014   Procedure: ILEOSTOMY CLOSURE;  Surgeon: Harriette Bouillon, MD;  Location: [facility] OR;  Service: General;  Laterality: N/A;   INCISIONAL HERNIA REPAIR N/A 11/02/2015   Procedure: LAPAROSCOPIC INCISIONAL HERNIA;  Surgeon: Harriette Bouillon, MD;  Location: [facility] OR;  Service: General;  Laterality: N/A;   INSERTION OF MESH N/A 11/02/2015   Procedure: INSERTION OF MESH;  Surgeon: Harriette Bouillon, MD;  Location: [facility] OR;  Service: General;  Laterality: N/A;   LAPAROSCOPIC PARTIAL COLECTOMY N/A 08/26/2014   Procedure: LAPAROSCOPIC ASSISTED PARTIAL  COLECTOMY WITH OSTOMY;  Surgeon: Harriette Bouillon, MD;  Location: [facility] OR;  Service: General;  Laterality: N/A;   SCLEROTHERAPY  12/29/2021   Procedure: Susa Day;  Surgeon: Kathi Der, MD;  Location: [facility];  Service: Gastroenterology;;     Social History:   reports that he has never smoked. He has never used smokeless tobacco. He reports that he does not drink alcohol and does not use drugs.   Family History:  His family history includes Cancer in his father; Diabetes in his maternal grandmother; Heart disease in his father.   Allergies Allergies  Allergen Reactions   Adhesive [Tape] Hives and Other (See Comments)    NO PLASTIC tape, please!!     Home Medications  Prior to Admission medications   Medication Sig Start Date End Date Taking? Authorizing Provider  acetaminophen (TYLENOL) 325 MG tablet Take 2 tablets (650 mg total) by mouth every 6 (six) hours as needed for mild pain (or temp > 100). Patient taking differently: Take 650 mg by mouth every 6 (six) hours as needed for mild pain, fever or headache. 08/31/14   Nonie Hoyer, PA-C  albuterol (VENTOLIN HFA) 108 (90 Base) MCG/ACT inhaler Inhale 2 puffs into the lungs every 6 (six) hours as needed for wheezing or shortness of breath.    [provider]  blood glucose meter kit and supplies KIT Dispense based on patient and insurance preference. Use up to four times daily as directed. 12/30/21   Lorin Glass, MD  carvedilol (COREG) 12.5 MG tablet Take 12.5 mg by mouth in the morning and at bedtime.    [provider]  cyclobenzaprine (FLEXERIL) 10 MG tablet Take 10 mg by mouth 3 (three) times daily.    [provider]  fluticasone (FLONASE) 50 MCG/ACT nasal spray SPRAY 2 SPRAYS INTO EACH NOSTRIL EVERY DAY Patient taking differently: Place 2 sprays into both nostrils daily as needed for allergies or rhinitis. 01/06/19   Steffanie Dunn, DO  gabapentin (NEURONTIN) 300 MG capsule Take 300 mg by  mouth in the morning.    [provider]  gabapentin (NEURONTIN) 600 MG tablet Take 600 mg by mouth at bedtime. 06/01/19   [provider]  glipiZIDE (GLUCOTROL XL) 10 MG 24 hr tablet Take 10 mg by mouth 2 (two) times  daily with a meal.    [provider]  HYDROcodone-acetaminophen (NORCO/VICODIN) 5-325 MG tablet Take 1 tablet by mouth in the morning and at bedtime.    [provider]  insulin glargine (LANTUS SOLOSTAR) 100 UNIT/ML Solostar Pen Inject 10 Units into the skin daily. 12/30/21 03/30/22  Lorin Glass, MD  Insulin Pen Needle 32G X 8 MM MISC Use as directed 12/30/21   Lorin Glass, MD  metFORMIN (GLUCOPHAGE) 1000 MG tablet Take 1,000 mg by mouth in the morning and at bedtime.    [provider]  minocycline (MINOCIN,DYNACIN) 50 MG capsule Take 50 mg by mouth daily as needed (for acne breakouts).    [provider]  pantoprazole (PROTONIX) 40 MG tablet Take 1 tablet (40 mg total) by mouth 2 (two) times daily before a meal. Switch for any other PPI at similar dose and frequency 12/30/21 03/30/22  Dahal, Melina Schools, MD  pioglitazone (ACTOS) 30 MG tablet Take 30 mg by mouth every evening.    [provider]  rosuvastatin (CRESTOR) 40 MG tablet Take 20 mg by mouth daily.    [provider]  tiZANidine (ZANAFLEX) 4 MG tablet Take 4 mg by mouth every 8 (eight) hours as needed for muscle spasms.    [provider]     Critical care time: 48    Lenard Galloway [facility] 03/11/23 8:11 PM  Please see [email or web address] for pager details.  From 7A-7P if no response, please call [phone] After hours, please call ELink [phone]

## 2023-03-11 NOTE — ED Triage Notes (Signed)
 Patient arrives via POV to ED with complaints of increased fatigue, urinary retention, and having a fall. Patient has been sick x3 days as well. He has not been able to urinate in 2 days.  Rates pain an 8/10.

## 2023-03-11 NOTE — ED Provider Notes (Signed)
 [place] EMERGENCY DEPARTMENT AT [facility] Provider Note   CSN: [id number] Arrival date & time: 03/11/23  1058     History  Chief Complaint  Patient presents with   Urinary Retention   Fatigue    Bryan [name] is a 53 y.o. male.  This is a 53 year old male presenting emergency department with generalized malaise, nausea, vomiting x 3 days.  Reports ambulatory dysfunction over the past month or so, fell on Thursday, did not hit his head, did not blackout all.  Having some minor suprapubic abdominal pain.  He is concerned because he has not urinated since Friday.  He has not had a bowel movement since that time as well. Having some minor lightheadedness, but no CP. No SHOB. Complains of pain to lower extremities.          Home Medications Prior to Admission medications   Medication Sig Start Date End Date Taking? Authorizing Provider  acetaminophen (TYLENOL) 325 MG tablet Take 2 tablets (650 mg total) by mouth every 6 (six) hours as needed for mild pain (or temp > 100). Patient taking differently: Take 650 mg by mouth every 6 (six) hours as needed for mild pain, fever or headache. 08/31/14   Nonie Hoyer, PA-C  albuterol (VENTOLIN HFA) 108 (90 Base) MCG/ACT inhaler Inhale 2 puffs into the lungs every 6 (six) hours as needed for wheezing or shortness of breath.    [provider]  blood glucose meter kit and supplies KIT Dispense based on patient and insurance preference. Use up to four times daily as directed. 12/30/21   Lorin Glass, MD  carvedilol (COREG) 12.5 MG tablet Take 12.5 mg by mouth in the morning and at bedtime.    [provider]  cyclobenzaprine (FLEXERIL) 10 MG tablet Take 10 mg by mouth 3 (three) times daily.    [provider]  fluticasone (FLONASE) 50 MCG/ACT nasal spray SPRAY 2 SPRAYS INTO EACH NOSTRIL EVERY DAY Patient taking differently: Place 2 sprays into both nostrils daily as needed for allergies or rhinitis.  01/06/19   Steffanie Dunn, DO  gabapentin (NEURONTIN) 300 MG capsule Take 300 mg by mouth in the morning.    [provider]  gabapentin (NEURONTIN) 600 MG tablet Take 600 mg by mouth at bedtime. 06/01/19   [provider]  glipiZIDE (GLUCOTROL XL) 10 MG 24 hr tablet Take 10 mg by mouth 2 (two) times daily with a meal.    [provider]  HYDROcodone-acetaminophen (NORCO/VICODIN) 5-325 MG tablet Take 1 tablet by mouth in the morning and at bedtime.    [provider]  insulin glargine (LANTUS SOLOSTAR) 100 UNIT/ML Solostar Pen Inject 10 Units into the skin daily. 12/30/21 03/30/22  Lorin Glass, MD  Insulin Pen Needle 32G X 8 MM MISC Use as directed 12/30/21   Lorin Glass, MD  metFORMIN (GLUCOPHAGE) 1000 MG tablet Take 1,000 mg by mouth in the morning and at bedtime.    [provider]  minocycline (MINOCIN,DYNACIN) 50 MG capsule Take 50 mg by mouth daily as needed (for acne breakouts).    [provider]  pantoprazole (PROTONIX) 40 MG tablet Take 1 tablet (40 mg total) by mouth 2 (two) times daily before a meal. Switch for any other PPI at similar dose and frequency 12/30/21 03/30/22  Dahal, Melina Schools, MD  pioglitazone (ACTOS) 30 MG tablet Take 30 mg by mouth every evening.    [provider]  rosuvastatin (CRESTOR) 40 MG tablet Take 20 mg  by mouth daily.    [provider]  tiZANidine (ZANAFLEX) 4 MG tablet Take 4 mg by mouth every 8 (eight) hours as needed for muscle spasms.    [provider]      Allergies    Adhesive [tape]    Review of Systems   Review of Systems  Physical Exam Updated Vital Signs BP (!) 74/32   Pulse 84   Temp 98.2 F (36.8 C)   Resp 20   Ht 5\' 10"  (1.778 m)   Wt 108.9 kg   SpO2 96%   BMI 34.44 kg/m  Physical Exam Vitals and nursing note reviewed.  Constitutional:      Appearance: He is obese.  HENT:     Head: Normocephalic and atraumatic.     Nose: Nose normal.      Mouth/Throat:     Mouth: Mucous membranes are dry.  Eyes:     Conjunctiva/sclera: Conjunctivae normal.  Cardiovascular:     Rate and Rhythm: Normal rate and regular rhythm.  Pulmonary:     Effort: Pulmonary effort is normal.     Breath sounds: Normal breath sounds.  Abdominal:     General: Abdomen is flat. There is no distension.     Palpations: Abdomen is soft.     Tenderness: There is abdominal tenderness. There is no guarding or rebound.  Musculoskeletal:        General: Normal range of motion.     Right lower leg: No edema.     Left lower leg: No edema.     Comments: 4-5 plantarflexion dorsiflexion bilaterally.  Equal pulses.  Difficulty lifting legs off bed secondary to pain  Skin:    Capillary Refill: Capillary refill takes less than 2 seconds.  Neurological:     Mental Status: He is alert and oriented to person, place, and time.     Cranial Nerves: No cranial nerve deficit.     Sensory: No sensory deficit.     Motor: No weakness.     Coordination: Coordination normal.  Psychiatric:        Mood and Affect: Mood normal.        Behavior: Behavior normal.     ED Results / Procedures / Treatments   Labs (all labs ordered are listed, but only abnormal results are displayed) Labs Reviewed  BASIC METABOLIC PANEL - Abnormal; Notable for the following components:      Result Value   Sodium 126 (*)    Chloride 83 (*)    Glucose, Bld 221 (*)    BUN 42 (*)    Creatinine, Ser 5.39 (*)    GFR, Estimated 12 (*)    Anion gap 18 (*)    All other components within normal limits  CBC - Abnormal; Notable for the following components:   WBC 12.9 (*)    All other components within normal limits  URINALYSIS, ROUTINE W REFLEX MICROSCOPIC - Abnormal; Notable for the following components:   Glucose, UA >1,000 (*)    Protein, ur 30 (*)    All other components within normal limits  CBG MONITORING, ED - Abnormal; Notable for the following components:   Glucose-Capillary 229 (*)    All  other components within normal limits  I-STAT VENOUS BLOOD GAS, ED - Abnormal; Notable for the following components:   pO2, Ven 15 (*)    Sodium 128 (*)    Calcium, Ion 1.02 (*)    HCT 38.0 (*)    Hemoglobin 12.9 (*)  All other components within normal limits  RESP PANEL BY RT-PCR (RSV, FLU A&B, COVID)  RVPGX2  CULTURE, BLOOD (ROUTINE X 2)  CULTURE, BLOOD (ROUTINE X 2)  LACTIC ACID, PLASMA  HEPATIC FUNCTION PANEL  LIPASE, BLOOD  LACTIC ACID, PLASMA  TSH  T4, FREE  TROPONIN I (HIGH SENSITIVITY)  TROPONIN I (HIGH SENSITIVITY)    EKG EKG Interpretation Date/Time:  Sunday March 11 2023 11:17:45 EST Ventricular Rate:  90 PR Interval:  154 QRS Duration:  71 QT Interval:  377 QTC Calculation: 462 R Axis:   51  Text Interpretation: Sinus rhythm Low voltage, precordial leads Confirmed by Estanislado Pandy [phone]) on 03/11/2023 11:18:49 AM  Radiology CT ABDOMEN PELVIS WO CONTRAST Result Date: 03/11/2023 CLINICAL DATA:  53 year old male with urinary retention, fatigue, low blood pressure. EXAM: CT ABDOMEN AND PELVIS WITHOUT CONTRAST TECHNIQUE: Multidetector CT imaging of the abdomen and pelvis was performed following the standard protocol without IV contrast. RADIATION DOSE REDUCTION: This exam was performed according to the departmental dose-optimization program which includes automated exposure control, adjustment of the mA and/or kV according to patient size and/or use of iterative reconstruction technique. COMPARISON:  CT Abdomen and Pelvis 09/26/2017. FINDINGS: Lower chest: No cardiomegaly or pericardial effusion. Mild linear lung base atelectasis or scarring. Hepatobiliary: Negative noncontrast liver and gallbladder. Pancreas: Partial fatty atrophy. Spleen: Negative. Adrenals/Urinary Tract: Normal adrenal glands. Nonobstructed kidneys. No nephrolithiasis. Normal ureters. Unremarkable bladder. Stomach/Bowel: Chronic anastomosis at the junction of the descending and sigmoid colon with no  adverse features. Mild large bowel retained stool. No large bowel inflammation identified. Evidence of previous appendectomy on series 2, image 82. Nondilated small bowel. Chronic right abdominal small bowel anastomosis on series 2, image 69 with no adverse features. Overlying chronic postoperative changes to the ventral abdominal wall there, probably a chronic ostomy takedown site. No ventral abdominal bowel hernia. Retained fluid and gas in the stomach. Duodenum is nondilated. No free air, free fluid, or mesenteric inflammation identified. Vascular/Lymphatic: Normal caliber abdominal aorta. Mild Aortoiliac calcified atherosclerosis. Vascular patency is not evaluated in the absence of IV contrast. No lymphadenopathy. Reproductive: Small fat containing inguinal hernias. Other: No pelvic free fluid. Musculoskeletal: No acute osseous abnormality identified. 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 Advanced chronic L4-L5 degeneration. IMPRESSION: 1. Chronic postoperative changes to the large and small bowel with no adverse features. 2. No urinary calculus. No acute or inflammatory process identified in the noncontrast abdomen or pelvis. 3. Mild Aortic Atherosclerosis (ICD10-I70.0). Electronically Signed   By: Odessa Fleming M.D.   On: 03/11/2023 12:33   DG Chest Portable 1 View Result Date: 03/11/2023 CLINICAL DATA:  Fatigue, hypertension. EXAM: PORTABLE CHEST 1 VIEW COMPARISON:  December 28, 2021. FINDINGS: The heart size and mediastinal contours are within normal limits. Hypoinflation of the lungs is noted. Both lungs are clear. The visualized skeletal structures are unremarkable. IMPRESSION: Hypoinflation of the lungs.  No active disease. Electronically Signed   By: Lupita Raider M.D.   On: 03/11/2023 11:54    Procedures .Critical Care  Performed by: Coral Spikes, DO Authorized by: Coral Spikes, DO   Critical care provider statement:    Critical care time (minutes):  30    Critical care was necessary to treat or prevent imminent or life-threatening deterioration of the following conditions:  Shock and sepsis   Critical care was time spent personally by me on the following activities:  Development of treatment plan with patient or surrogate, discussions with consultants, evaluation of patient's  response to treatment, examination of patient, ordering and review of laboratory studies, ordering and review of radiographic studies, ordering and performing treatments and interventions, pulse oximetry, re-evaluation of patient's condition and review of old charts   Care discussed with: admitting provider   ARTERIAL LINE  Date/Time: 03/11/2023 2:53 PM  Performed by: Coral Spikes, DO Authorized by: Coral Spikes, DO   Consent:    Consent obtained:  Verbal   Consent given by:  Patient   Risks discussed:  Bleeding, ischemia, pain and infection Universal protocol:    Procedure explained and questions answered to patient or proxy's satisfaction: yes     Patient identity confirmed:  Verbally with patient Indications:    Indications: hemodynamic monitoring   Pre-procedure details:    Skin preparation:  Chlorhexidine Sedation:    Sedation type:  None Anesthesia:    Anesthesia method:  None Procedure details:    Location:  L radial   Needle gauge:  18 G   Placement technique:  Seldinger   Number of attempts:  1   Transducer: waveform confirmed   Post-procedure details:    Post-procedure:  Biopatch applied, secured with tape, sterile dressing applied and sutured   CMS:  Normal   Procedure completion:  Tolerated     Medications Ordered in ED Medications  lactated ringers infusion (0 mLs Intravenous Stopped 03/11/23 1452)  vancomycin (VANCOCIN) IVPB 1000 mg/200 mL premix (1,000 mg Intravenous New Bag/Given 03/11/23 1408)  norepinephrine (LEVOPHED) 4mg  in (0.016 mg/mL) premix infusion (2 mcg/min Intravenous New Bag/Given 03/11/23 1433)  sodium chloride 0.9 %  bolus 1,000 mL (0 mLs Intravenous Stopped 03/11/23 1317)  ceFEPIme (MAXIPIME) 2 g in sodium chloride 0.9 % 100 mL IVPB (0 g Intravenous Stopped 03/11/23 1220)  metroNIDAZOLE (FLAGYL) IVPB 500 mg (0 mg Intravenous Stopped 03/11/23 1433)  sodium chloride 0.9 % bolus 1,000 mL (0 mLs Intravenous Stopped 03/11/23 1317)  acetaminophen (TYLENOL) tablet 650 mg (650 mg Oral Given 03/11/23 1238)  lactated ringers bolus 500 mL (0 mLs Intravenous Stopped 03/11/23 1433)  lactated ringers bolus 1,000 mL (1,000 mLs Intravenous New Bag/Given 03/11/23 1452)    ED Course/ Medical Decision Making/ A&P Clinical Course as of 03/11/23 1454  Sun Mar 11, 2023  1159 Basic metabolic panel(!) Chronic hyponatremia. AKI noted. Does have anion gap; suspect from uremia.    [TY]  1200 WBC(!): 12.9 Febrile, hypotensive as well. IVFs going. Will start broad antibiotics.  [TY]  1200 EKG 12-Lead IMPRESSION: Hypoinflation of the lungs.  No active disease.   [TY]  1247 CT ABDOMEN PELVIS WO CONTRAST IMPRESSION: 1. Chronic postoperative changes to the large and small bowel with no adverse features. 2. No urinary calculus. No acute or inflammatory process identified in the noncontrast abdomen or pelvis. 3. Mild Aortic Atherosclerosis (ICD10-I70.0).   Electronically Signed   By: Odessa Fleming M.D.   On: 03/11/2023 12:33   [TY]  1429 Patient with discrepancies in blood pressure between monitor and manual pressures.  Seemingly inconsistent.  Placed A-line with blood pressure being more consistent with the lower readings in the 70s systolic.  He has received 2-1/2 L of fluids.  He does meet markers of sepsis with his fever, elevated white cell count and hypotension, however does not have an elevated lactate and no obvious source of infection. Received broad-spectrum antibiotics.  Suspect mixed shock.  Hypovolemic/septic with possible worsening with his home carvedilol use.  Starting Levophed for blood pressure support given persistent  hypotension despite IV fluids.Marland Kitchen  Will consult ICU for admission. [TY]  1436 Spoke with Dr. Johny Drilling, ICU will admit patient. [TY]    Clinical Course User Index [TY] Coral Spikes, DO                                 Medical Decision Making 53 year old male present emergency department for generalized malaise, abdominal pain and decreased urinary output.  Does have a history of diabetes, hypertension, diverticulosis and prior kidney stones.  He is hypotensive, borderline febrile, but nontachycardic maintaining oxygen saturation on room air.  The patient notes that he did take his blood pressure medications this morning, did not check his blood pressure prior to taking them.  He is alert and orient x 3 with no focal deficits.  Does have some global weakness in his bilateral lower extremities.  Bladder scan with only 200 mL.  Given patient's abnormal vitals will get basic labs and screen for sepsis. See ED course for further mdm/disposition.   Amount and/or Complexity of Data Reviewed Labs: ordered. Decision-making details documented in ED Course. Radiology: ordered. Decision-making details documented in ED Course. ECG/medicine tests:  Decision-making details documented in ED Course.  Risk OTC drugs. Prescription drug management. Decision regarding hospitalization.         Final Clinical Impression(s) / ED Diagnoses Final diagnoses:  None    Rx / DC Orders ED Discharge Orders     None         Coral Spikes, DO 03/11/23 1455

## 2023-03-11 NOTE — ED Notes (Signed)
 Called [facility] for transport, pt bed assignment is ready

## 2023-03-11 NOTE — Progress Notes (Signed)
 PHARMACY ANTIBIOTIC CONSULT NOTE   Bryan [name] a 53 y.o. male admitted on 2/23 with undifferentiated shock  Pharmacy has been consulted for cefepime and vancomycin dosing.  Received vancomycin 1000 mg, cefepime 2 gm, metronidazole 500 mg in the ED ~1200 (vanc given @1400 )  03/11/23: Scr 5.39 (BL ~1), LA 1.9,  WBC 12.9  Vital Signs: Tm 100.70F, HR 77 Estimated Creatinine Clearance: 20.1 mL/min (A) (by C-G formula based on SCr of 5.39 mg/dL (H)).  Plan: START Cefepime 2g IV Q24H GIVE additional 1000 mg of vancomycin Obtain random level with AM labs  Monitor renal function, clinical status, de-escalation, C/S, levels as indicated   Allergies:  Allergies  Allergen Reactions   Adhesive [Tape] Hives and Other (See Comments)    NO PLASTIC tape, please!!    [facility] Weights   03/11/23 1110 03/11/23 1945  Weight: 108.9 kg (240 lb) 120.2 kg (264 lb 15.9 oz)       Latest Ref Rng & Units 03/11/2023   12:34 PM 03/11/2023   11:11 AM 10/02/2022    5:20 PM  CBC  WBC 4.0 - 10.5 K/uL  12.9    Hemoglobin 13.0 - 17.0 g/dL 16.1  09.6  04.5   Hematocrit 39.0 - 52.0 % 38.0  42.2  45.0   Platelets 150 - 400 K/uL  247      Antibiotics Given (last 72 hours)     Date/Time Action Medication Dose Rate   03/11/23 1144 New Bag/Given   ceFEPIme (MAXIPIME) 2 g in sodium chloride 0.9 % 100 mL IVPB 2 g 200 mL/hr   03/11/23 1224 New Bag/Given   metroNIDAZOLE (FLAGYL) IVPB 500 mg 500 mg 100 mL/hr   03/11/23 1408 New Bag/Given   vancomycin (VANCOCIN) IVPB 1000 mg/200 mL premix 1,000 mg 200 mL/hr       Antimicrobials this admission: Metronidazole 2/23 x1 Cefepime 2/23 > c Vancomycin 2/23 > c  Microbiology results: 2/23 Bcx: sent 2/23 Ucx: sent  2/23 MRSA PCR: sent   Thank you for allowing pharmacy to be a part of this patient's care.  Trixie Rude, PharmD Clinical Pharmacist 03/11/2023  8:17 PM

## 2023-03-11 NOTE — Sepsis Progress Note (Signed)
 Elink following code sepsis

## 2023-03-11 NOTE — ED Notes (Signed)
 [facility] arrived in ED; preparing to transport pt

## 2023-03-11 NOTE — Progress Notes (Addendum)
 [facility] Physician-Brief Progress Note Patient Name: Bryan [name] DOB: 1970/05/13 MRN: [id number]   Date of Service  03/11/2023  HPI/Events of Note  53 yr old male with CC: N/V/Malaise, fell did not hit, took his coreg/HTN meds in AM.  Hx of DM, HTN, diverticulitis-ileostomy.  CT abdomen w/o contrast did not show any acute findings. Low grade fever, wbc 12.9, LA normal. EKG without acute ST changes. Admitted to ICU for  Undifferentiated shock, mostly due to dehydration, carvedilol induced, less likely from sepsis. TSH normal. UA neg. No viral syndrome. Lipase normal. CxR no pneumonia.  - s/p 3.5 lit fluids, antibiotics. On Levophed at 15. On room air.  - trend LA, Urine out put.   Hyponatremia at 128. Keep mag/K over 2/4 respectively.   Obese. Pco2 at 51. On room air.  HTN/DM. Trop normal   Camera: Morbidly obese. AAOx4 On room air VS stable. On levo at 15   eICU Interventions  Notified PCCM ground team.       Intervention Category Major Interventions: Hypotension - evaluation and management Evaluation Type: New Patient Evaluation  Ranee Gosselin 03/11/2023, 7:48 PM  23:11 LA at 2.1 , follow LA AM once Cr 4.6. getting LR bolus. Klor 20 meq once. Mag 1 GM iv.  On SSI.  Hyponatremia. Follow closely for any further worsening.  Drug screening + for opiates.

## 2023-03-12 ENCOUNTER — Encounter (HOSPITAL_COMMUNITY): Payer: Self-pay | Admitting: Internal Medicine

## 2023-03-12 ENCOUNTER — Inpatient Hospital Stay (HOSPITAL_COMMUNITY): Payer: Medicaid Other

## 2023-03-12 DIAGNOSIS — R578 Other shock: Secondary | ICD-10-CM | POA: Diagnosis not present

## 2023-03-12 DIAGNOSIS — E872 Acidosis, unspecified: Secondary | ICD-10-CM

## 2023-03-12 DIAGNOSIS — G4733 Obstructive sleep apnea (adult) (pediatric): Secondary | ICD-10-CM

## 2023-03-12 DIAGNOSIS — N179 Acute kidney failure, unspecified: Secondary | ICD-10-CM | POA: Diagnosis not present

## 2023-03-12 DIAGNOSIS — R579 Shock, unspecified: Secondary | ICD-10-CM | POA: Diagnosis not present

## 2023-03-12 DIAGNOSIS — E785 Hyperlipidemia, unspecified: Secondary | ICD-10-CM | POA: Diagnosis not present

## 2023-03-12 LAB — GLUCOSE, CAPILLARY
Glucose-Capillary: 152 mg/dL — ABNORMAL HIGH (ref 70–99)
Glucose-Capillary: 162 mg/dL — ABNORMAL HIGH (ref 70–99)
Glucose-Capillary: 171 mg/dL — ABNORMAL HIGH (ref 70–99)
Glucose-Capillary: 180 mg/dL — ABNORMAL HIGH (ref 70–99)
Glucose-Capillary: 184 mg/dL — ABNORMAL HIGH (ref 70–99)
Glucose-Capillary: 186 mg/dL — ABNORMAL HIGH (ref 70–99)
Glucose-Capillary: 248 mg/dL — ABNORMAL HIGH (ref 70–99)

## 2023-03-12 LAB — TROPONIN I (HIGH SENSITIVITY)
Troponin I (High Sensitivity): 4 ng/L (ref <18)
Troponin I (High Sensitivity): 4 ng/L (ref <18)

## 2023-03-12 LAB — URINE CULTURE: Culture: NO GROWTH

## 2023-03-12 LAB — LACTIC ACID, PLASMA: Lactic Acid, Venous: 1.4 mmol/L (ref 0.5–1.9)

## 2023-03-12 LAB — CBC
HCT: 30.9 % — ABNORMAL LOW (ref 39.0–52.0)
Hemoglobin: 11.1 g/dL — ABNORMAL LOW (ref 13.0–17.0)
MCH: 29.4 pg (ref 26.0–34.0)
MCHC: 35.9 g/dL (ref 30.0–36.0)
MCV: 82 fL (ref 80.0–100.0)
Platelets: 160 10*3/uL (ref 150–400)
RBC: 3.77 MIL/uL — ABNORMAL LOW (ref 4.22–5.81)
RDW: 14.8 % (ref 11.5–15.5)
WBC: 10.3 10*3/uL (ref 4.0–10.5)
nRBC: 0 % (ref 0.0–0.2)

## 2023-03-12 LAB — TSH: TSH: 0.686 u[IU]/mL (ref 0.350–4.500)

## 2023-03-12 LAB — ECHOCARDIOGRAM COMPLETE
AR max vel: 2.13 cm2
AV Area VTI: 2.23 cm2
AV Area mean vel: 2.11 cm2
AV Mean grad: 6 mm[Hg]
AV Peak grad: 10.5 mm[Hg]
Ao pk vel: 1.62 m/s
Area-P 1/2: 4.68 cm2
Height: 70 in
S' Lateral: 3.4 cm
Weight: 4239.89 [oz_av]

## 2023-03-12 LAB — BASIC METABOLIC PANEL
Anion gap: 12 (ref 5–15)
BUN: 40 mg/dL — ABNORMAL HIGH (ref 6–20)
CO2: 22 mmol/L (ref 22–32)
Calcium: 7.8 mg/dL — ABNORMAL LOW (ref 8.9–10.3)
Chloride: 90 mmol/L — ABNORMAL LOW (ref 98–111)
Creatinine, Ser: 4.26 mg/dL — ABNORMAL HIGH (ref 0.61–1.24)
GFR, Estimated: 16 mL/min — ABNORMAL LOW (ref >60)
Glucose, Bld: 191 mg/dL — ABNORMAL HIGH (ref 70–99)
Potassium: 4.2 mmol/L (ref 3.5–5.1)
Sodium: 124 mmol/L — ABNORMAL LOW (ref 135–145)

## 2023-03-12 LAB — MAGNESIUM: Magnesium: 1.9 mg/dL (ref 1.7–2.4)

## 2023-03-12 LAB — PHOSPHORUS: Phosphorus: 5.2 mg/dL — ABNORMAL HIGH (ref 2.5–4.6)

## 2023-03-12 LAB — VANCOMYCIN, RANDOM: Vancomycin Rm: 23 ug/mL

## 2023-03-12 LAB — CORTISOL: Cortisol, Plasma: 22.2 ug/dL

## 2023-03-12 LAB — D-DIMER, QUANTITATIVE: D-Dimer, Quant: 0.79 ug{FEU}/mL — ABNORMAL HIGH (ref 0.00–0.50)

## 2023-03-12 LAB — BRAIN NATRIURETIC PEPTIDE: B Natriuretic Peptide: 88.3 pg/mL (ref 0.0–100.0)

## 2023-03-12 MED ORDER — PIPERACILLIN-TAZOBACTAM 3.375 G IVPB
3.3750 g | Freq: Three times a day (TID) | INTRAVENOUS | Status: DC
  Administered 2023-03-12 – 2023-03-14 (×5): 3.375 g via INTRAVENOUS
  Filled 2023-03-12 (×5): qty 50

## 2023-03-12 MED ORDER — SODIUM ZIRCONIUM CYCLOSILICATE 10 G PO PACK
10.0000 g | PACK | Freq: Once | ORAL | Status: AC
  Administered 2023-03-12: 10 g via ORAL
  Filled 2023-03-12: qty 1

## 2023-03-12 MED ORDER — PERFLUTREN LIPID MICROSPHERE
1.0000 mL | INTRAVENOUS | Status: DC | PRN
  Administered 2023-03-12: 4 mL via INTRAVENOUS

## 2023-03-12 MED ORDER — PIPERACILLIN-TAZOBACTAM 3.375 G IVPB 30 MIN
3.3750 g | Freq: Once | INTRAVENOUS | Status: AC
  Administered 2023-03-12: 3.375 g via INTRAVENOUS
  Filled 2023-03-12: qty 50

## 2023-03-12 MED ORDER — INSULIN ASPART 100 UNIT/ML IJ SOLN
0.0000 [IU] | Freq: Three times a day (TID) | INTRAMUSCULAR | Status: DC
  Administered 2023-03-12 (×2): 3 [IU] via SUBCUTANEOUS
  Administered 2023-03-13: 5 [IU] via SUBCUTANEOUS
  Administered 2023-03-13 – 2023-03-15 (×6): 3 [IU] via SUBCUTANEOUS
  Administered 2023-03-16: 2 [IU] via SUBCUTANEOUS
  Administered 2023-03-16 – 2023-03-17 (×2): 3 [IU] via SUBCUTANEOUS
  Administered 2023-03-17: 8 [IU] via SUBCUTANEOUS
  Administered 2023-03-17 – 2023-03-18 (×4): 3 [IU] via SUBCUTANEOUS
  Administered 2023-03-19: 2 [IU] via SUBCUTANEOUS
  Administered 2023-03-19: 3 [IU] via SUBCUTANEOUS

## 2023-03-12 MED ORDER — INSULIN ASPART 100 UNIT/ML IJ SOLN
0.0000 [IU] | Freq: Every day | INTRAMUSCULAR | Status: DC
  Administered 2023-03-13: 2 [IU] via SUBCUTANEOUS
  Administered 2023-03-18: 3 [IU] via SUBCUTANEOUS

## 2023-03-12 NOTE — Progress Notes (Signed)
 NAME:  Bryan [name], MRN:  [id number], DOB:  Oct 17, 1970, LOS: 1 ADMISSION DATE:  03/11/2023, CONSULTATION DATE:  2/23 REFERRING MD:  Maple Hudson, CHIEF COMPLAINT:  sepsis   History of Present Illness:  53 year old male with past medical history of hypertension, diabetes who presented to [facility] emergency department on 2/23 with complaint of fatigue, nausea and vomiting, reportedly not urinated since Friday 2/21.  Labs notable for Na 126, Cl 83, BUN 42, sCr 5.39, AG 18, WBC 12.9, resp panel negative, lactic 1.9, UA without UTI, troponin x2 negative. Presented afebrile, hypotensive, non-tachycardic. CXR with no abnormalities. CT AP without acute findings. Was given 3.5L IVF volume resuscitation, cefepime/vancomycin/flagyl. Bladder scan with only 200cc. With ongoing hypotension, was started on levophed and CCM consulted for admission.   On my exam patient endorses most of the story above. States that on Wednesday/Thursday he bent over and "fel pretty hard," hurting his back. Has a long history of chronic low back pain for which he takes oxycodone. States afterward he did not feel right. Then since Friday has had minimal to no urine output. He states he has been drinking normally, however. Had one episode N/V Saturday which has since resolved denies abdominal pain, chest pain, shortness of breath. No back surgery. No fevers at home.  Pertinent  Medical History  Hypertension, diabetes  Significant Hospital Events: Including procedures, antibiotic start and stop dates in addition to other pertinent events   2/23: ED for n/v/fatigue>severe AKI>ccm of admit  2/24 off pressors, AKI improving   Interim History / Subjective:  Admit for unspecified shock requiring levophed, now off No complaints overnight, AOx4, follows commands   Objective   Blood pressure 90/72, pulse 88, temperature 98.7 F (37.1 C), temperature source Oral, resp. rate 15, height 5\' 10"  (1.778 m), weight 120.2 kg, SpO2 98%.         Intake/Output Summary (Last 24 hours) at 03/12/2023 1006 Last data filed at 03/12/2023 0800 Gross per 24 hour  Intake 2831.92 ml  Output 1000 ml  Net 1831.92 ml   Filed Weights   03/11/23 1110 03/11/23 1945 03/12/23 0500  Weight: 108.9 kg 120.2 kg 120.2 kg    Examination: General: chronically ill middle aged adult male, sitting up in ICU bed, no distress HEENT: Normocephalic, pink MM, teeth intact, PERRLA intact CV: s1,s2, RRR, no MRG, no JVD Pulm: clear, diminished throughout, non labored, no distress, on RA Abdomen: BS active, no distention  Extremities: no edema, moves all extremities, some pain noted on lower extremities post fall Slightly more on left vs right scant bruising on left foot but no signs of deformities Neuro: AOX4, following commands, RASS 0 GU: foley, making urine   Resolved Hospital Problem list    Assessment & Plan:  Shock, undifferentiated Hypovolemic vs septic vs obstructive?  2/23 Not clear shock picture. Fall last week with low back pain. Subsequent malaise, episode of vomiting, minimal UOP. Presents with significant AKI, hypotension. No clear source found on work up. UA negative, CXR without pneumonia. CT AP with no obvious source. Did have low grade 100 temp. WBC 12.9. lactic negative. Troponins flat and EKG without ischemia. Or home opiates/BP medications exaggerating presentation.  Procal 1.05, repeat lactic 2.1, MRSA neg 2/24 ECHO EF 55-60%, LV normal function no regional wall abnormalities, D-shaped interventricular septum suggestive of RV pressure/volume overload. Right ventricular systolic function is mildly reduced. The right ventricular size is normal.  RV pressure/overload-concern for PE but pt not hypoxic, no chest pain, or tachycardic  P:  Off levophed, continue fluids  Continue MAP of >65  DC vanc, switch cefepime to zosyn  Trend WBC, and fever  Obtain Vas [place] of lower extremities  Obtain D-dimer Obtain TSH and cortisol  Repeat tropnin  and BNP   AKI Hyponatremia  Appears baseline ~ 1.2-1.5. Presenting BUN 42, sCr 5.39. minimal to no UOP since 2/21. Bladder scan 200cc. CT AP without obstructive pathology. UA without UTI.  P: Continue to trend renal function daily  Continue to monitor and optimize electrolytes daily Continue to monitor urine output Continue strict I/Os Continue Adequate renal perfusion  Avoid nephrotoxic agents   Diabetes  Urine with ketones, AG 18, BG 221, bicarb 25 Betahydroxy 0.28  On metformin and glipizide  P: Continue SSI but increase to moderate add HS coverage Continue CBG ACHS, Heart healthy, carb modified  A1C pending, f/u   Hypertension  Hyperlipidemia  P: Continue to hold antihypertensive meds  Continue statin   Best Practice (right click and "Reselect all SmartList Selections" daily)   Diet/type: NPO DVT prophylaxis: prophylactic heparin  Pressure ulcer(s): na GI prophylaxis: N/A Lines: Arterial Line and yes and it is still needed Foley:  Yes, and it is still needed Code Status:  full code Last date of multidisciplinary goals of care discussion [pending] Critical care time: 40 min     Christian Symantha Steeber AGACNP-BC   [facility] 03/12/2023, 10:31 AM  Please see [email or web address] for pager details.  From 7A-7P if no response, please call [phone]. After hours, please call [facility] [phone].

## 2023-03-12 NOTE — Progress Notes (Signed)
 PHARMACY ANTIBIOTIC CONSULT NOTE   Bryan [name] a 53 y.o. male admitted on 2/23 with undifferentiated shock  Pharmacy has been consulted for zosyn dosing.  -cultures- ngtd -WBC 10.3, afebrile -SCr= 4.26 (down, baseline ~ 1.0-1.2)  Plan: -Zosyn 3.375gm IV q8h -Will follow renal function, cultures and clinical progress   Allergies:  Allergies  Allergen Reactions   Adhesive [Tape] Hives and Other (See Comments)    NO PLASTIC tape, please!!    Filed Weights   03/11/23 1110 03/11/23 1945 03/12/23 0500  Weight: 108.9 kg (240 lb) 120.2 kg (264 lb 15.9 oz) 120.2 kg (264 lb 15.9 oz)       Latest Ref Rng & Units 03/12/2023   10:24 AM 03/11/2023    9:19 PM 03/11/2023   12:34 PM  CBC  WBC 4.0 - 10.5 K/uL 10.3  13.5    Hemoglobin 13.0 - 17.0 g/dL 16.1  09.6  04.5   Hematocrit 39.0 - 52.0 % 30.9  35.1  38.0   Platelets 150 - 400 K/uL 160  213      Antibiotics Given (last 72 hours)     Date/Time Action Medication Dose Rate   03/11/23 1144 New Bag/Given   ceFEPIme (MAXIPIME) 2 g in sodium chloride 0.9 % 100 mL IVPB 2 g 200 mL/hr   03/11/23 1224 New Bag/Given   metroNIDAZOLE (FLAGYL) IVPB 500 mg 500 mg 100 mL/hr   03/11/23 1408 New Bag/Given   vancomycin (VANCOCIN) IVPB 1000 mg/200 mL premix 1,000 mg 200 mL/hr   03/11/23 2351 New Bag/Given   vancomycin (VANCOCIN) IVPB 1000 mg/200 mL premix 1,000 mg 200 mL/hr   03/12/23 1128 New Bag/Given   ceFEPIme (MAXIPIME) 2 g in sodium chloride 0.9 % 100 mL IVPB 2 g 200 mL/hr       Antimicrobials this admission: Metronidazole 2/23 x1 Cefepime 2/23 > 2/24 Vancomycin 2/23 > 2/24 Zosyn 2/24>>  Microbiology results: 2/23 Bcx: ngtd 2/23 Ucx: sent  2/23 MRSA PCR: neg  Thank you for allowing pharmacy to be a part of this patient's care.  Harland German, PharmD Clinical Pharmacist **Pharmacist phone directory can now be found on [email or web address] ([email or web address]).  Listed under [facility].

## 2023-03-12 NOTE — Inpatient Diabetes Management (Signed)
 Inpatient Diabetes Program Recommendations  AACE/ADA: New Consensus Statement on Inpatient Glycemic Control (2015)  Target Ranges:  Prepandial:   less than 140 mg/dL      Peak postprandial:   less than 180 mg/dL (1-2 hours)      Critically ill patients:  140 - 180 mg/dL   Lab Results  Component Value Date   GLUCAP 186 (H) 03/12/2023   HGBA1C >15.5 (H) 12/28/2021    Latest Reference Range & Units 03/11/23 11:22 03/11/23 20:54 03/11/23 23:58 03/12/23 04:11 03/12/23 08:13  Glucose-Capillary 70 - 99 mg/dL 161 (H) 096 (H) 045 (H) 180 (H) 186 (H)  (H): Data is abnormally high  Diabetes history: DM2 Outpatient Diabetes medications: Lantus 10 units daily, Glucotrol 10 mg bid, Wegovy 0.25 mg weekly, Metformin 1 gm bid Current orders for Inpatient glycemic control: Novolog 0-15 units tid, 0-5 units hs  Inpatient Diabetes Program Recommendations:   Please consider: -Add Semglee 10 units daily   Thank you, Darel Hong E. Keoki Mchargue, RN, MSN, CDCES  Diabetes Coordinator Inpatient Glycemic Control Team Team Pager [phone] (8am-5pm) 03/12/2023 10:53 AM

## 2023-03-12 NOTE — Progress Notes (Signed)
*  PRELIMINARY RESULTS* Echocardiogram 2D Echocardiogram has been performed.  Bryan [name] 03/12/2023, 9:01 AM

## 2023-03-12 NOTE — Plan of Care (Signed)

## 2023-03-13 ENCOUNTER — Inpatient Hospital Stay (HOSPITAL_COMMUNITY): Payer: Medicaid Other

## 2023-03-13 DIAGNOSIS — R609 Edema, unspecified: Secondary | ICD-10-CM | POA: Diagnosis not present

## 2023-03-13 DIAGNOSIS — E785 Hyperlipidemia, unspecified: Secondary | ICD-10-CM | POA: Diagnosis not present

## 2023-03-13 DIAGNOSIS — D649 Anemia, unspecified: Secondary | ICD-10-CM

## 2023-03-13 DIAGNOSIS — I152 Hypertension secondary to endocrine disorders: Secondary | ICD-10-CM

## 2023-03-13 DIAGNOSIS — N179 Acute kidney failure, unspecified: Secondary | ICD-10-CM | POA: Diagnosis not present

## 2023-03-13 DIAGNOSIS — I959 Hypotension, unspecified: Secondary | ICD-10-CM

## 2023-03-13 DIAGNOSIS — E1159 Type 2 diabetes mellitus with other circulatory complications: Secondary | ICD-10-CM

## 2023-03-13 DIAGNOSIS — R579 Shock, unspecified: Secondary | ICD-10-CM | POA: Diagnosis not present

## 2023-03-13 DIAGNOSIS — E872 Acidosis, unspecified: Secondary | ICD-10-CM | POA: Diagnosis not present

## 2023-03-13 LAB — GLUCOSE, CAPILLARY
Glucose-Capillary: 126 mg/dL — ABNORMAL HIGH (ref 70–99)
Glucose-Capillary: 175 mg/dL — ABNORMAL HIGH (ref 70–99)
Glucose-Capillary: 181 mg/dL — ABNORMAL HIGH (ref 70–99)
Glucose-Capillary: 219 mg/dL — ABNORMAL HIGH (ref 70–99)
Glucose-Capillary: 227 mg/dL — ABNORMAL HIGH (ref 70–99)

## 2023-03-13 LAB — SODIUM, URINE, RANDOM: Sodium, Ur: 51 mmol/L

## 2023-03-13 LAB — URINALYSIS, COMPLETE (UACMP) WITH MICROSCOPIC
Bilirubin Urine: NEGATIVE
Glucose, UA: 50 mg/dL — AB
Ketones, ur: NEGATIVE mg/dL
Nitrite: NEGATIVE
Protein, ur: NEGATIVE mg/dL
Specific Gravity, Urine: 1.005 (ref 1.005–1.030)
pH: 5 (ref 5.0–8.0)

## 2023-03-13 LAB — CBC
HCT: 32.4 % — ABNORMAL LOW (ref 39.0–52.0)
Hemoglobin: 11.1 g/dL — ABNORMAL LOW (ref 13.0–17.0)
MCH: 26.8 pg (ref 26.0–34.0)
MCHC: 34.3 g/dL (ref 30.0–36.0)
MCV: 78.3 fL — ABNORMAL LOW (ref 80.0–100.0)
Platelets: 175 10*3/uL (ref 150–400)
RBC: 4.14 MIL/uL — ABNORMAL LOW (ref 4.22–5.81)
RDW: 14 % (ref 11.5–15.5)
WBC: 8.8 10*3/uL (ref 4.0–10.5)
nRBC: 0 % (ref 0.0–0.2)

## 2023-03-13 LAB — OSMOLALITY: Osmolality: 284 mosm/kg (ref 275–295)

## 2023-03-13 LAB — BASIC METABOLIC PANEL
Anion gap: 14 (ref 5–15)
BUN: 43 mg/dL — ABNORMAL HIGH (ref 6–20)
CO2: 19 mmol/L — ABNORMAL LOW (ref 22–32)
Calcium: 7.9 mg/dL — ABNORMAL LOW (ref 8.9–10.3)
Chloride: 91 mmol/L — ABNORMAL LOW (ref 98–111)
Creatinine, Ser: 4.46 mg/dL — ABNORMAL HIGH (ref 0.61–1.24)
GFR, Estimated: 15 mL/min — ABNORMAL LOW (ref >60)
Glucose, Bld: 157 mg/dL — ABNORMAL HIGH (ref 70–99)
Potassium: 3.6 mmol/L (ref 3.5–5.1)
Sodium: 124 mmol/L — ABNORMAL LOW (ref 135–145)

## 2023-03-13 LAB — HEMOGLOBIN A1C
Hgb A1c MFr Bld: >15.5 % — ABNORMAL HIGH (ref 4.8–5.6)
Mean Plasma Glucose: >398 mg/dL

## 2023-03-13 LAB — MAGNESIUM: Magnesium: 1.8 mg/dL (ref 1.7–2.4)

## 2023-03-13 LAB — PHOSPHORUS: Phosphorus: 5.8 mg/dL — ABNORMAL HIGH (ref 2.5–4.6)

## 2023-03-13 LAB — OSMOLALITY, URINE: Osmolality, Ur: 181 mosm/kg — ABNORMAL LOW (ref 300–900)

## 2023-03-13 MED ORDER — LIVING WELL WITH DIABETES BOOK
Freq: Once | Status: DC
  Filled 2023-03-13: qty 1

## 2023-03-13 MED ORDER — INSULIN GLARGINE 100 UNIT/ML ~~LOC~~ SOLN
10.0000 [IU] | Freq: Every day | SUBCUTANEOUS | Status: DC
  Administered 2023-03-13 – 2023-03-18 (×6): 10 [IU] via SUBCUTANEOUS
  Filled 2023-03-13 (×8): qty 0.1

## 2023-03-13 MED ORDER — INSULIN GLARGINE-YFGN 100 UNIT/ML ~~LOC~~ SOLN
10.0000 [IU] | Freq: Every day | SUBCUTANEOUS | Status: DC
  Filled 2023-03-13: qty 0.1

## 2023-03-13 MED ORDER — TECHNETIUM TO 99M ALBUMIN AGGREGATED
4.0000 | Freq: Once | INTRAVENOUS | Status: AC | PRN
  Administered 2023-03-13: 4 via INTRAVENOUS

## 2023-03-13 NOTE — Plan of Care (Signed)
   Problem: Education: Goal: Knowledge of General Education information will improve Description: Including pain rating scale, medication(s)/side effects and non-pharmacologic comfort measures Outcome: Progressing   Problem: Clinical Measurements: Goal: Respiratory complications will improve Outcome: Progressing   Problem: Nutrition: Goal: Adequate nutrition will be maintained Outcome: Progressing

## 2023-03-13 NOTE — Plan of Care (Signed)

## 2023-03-13 NOTE — Consult Note (Signed)
 Nephrology Consult   Reason for consult: AKI   Assessment/Recommendations:   AKI -bl Cr around 1 -suspecting AKI is secondary to ATN from shock. Cr seems to be in plateau phase now. No obstruction on initial CT. Recheck urine sediment.  -continue with supportive care for now. No indication for renal replacement therapy at this junction -Avoid nephrotoxic medications including NSAIDs and iodinated intravenous contrast exposure unless the latter is absolutely indicated.  Preferred narcotic agents for pain control are hydromorphone, fentanyl, and methadone. Morphine should not be used. Avoid Baclofen and avoid oral sodium phosphate and magnesium citrate based laxatives / bowel preps. Continue strict Input and Output monitoring. Will monitor the patient closely with you and intervene or adjust therapy as indicated by changes in clinical status/labs    Hyponatremia -suspecting this is related AKI -checking osm, urine osm, urine na. TSH checked during admit-wnl -fluid restrict for now: 1.2L/day  Shock -etiology unclear -off pressors -per primary -cont to hold home anti-HTNs  Acidosis -mild, monitor for now  Anemia -Transfuse for Hgb<7 g/dL -hgb stable/acceptable  Uncontrolled Diabetes Mellitus Type 2 with Hyperglycemia -per primary  Recommendations conveyed to primary service.   Anthony Sar [facility] 03/13/2023 3:33 PM _____________________________________________________________________________________  History of Present Illness: Bryan [name] is a/an 53 y.o. male with a past medical history of uncontrolled DM, HTN, obesity, chronic back pain, anemia who presents initially with N/V, fatigue. Did not urinate with 2/21 prior to admit. Was found to have a Na of 126, Cr 5.39, WBC 12.9. Was hypotensive, s/p 3.5L of fluids and received empiric abx. Was started on levophed, stopped on 2/24.  Sodium trended down from 128 on 2/23 to 124 since then. Has been on  LR 125cc/hr. Patient seen and examined in ICU. He reports feeling well, is urinating now. Not really eating much but drinking a lot of water. Does have RLE pain from a fall he had last Friday POA. Denies any recent NSAID use. He does report that his coreg was increased at his last physical in Jan. Denies any chest pain, SOB, swelling, dysuria, hematuria, dizziness, blurry vision, parasthesias. No other complaints  Medications:  Current Facility-Administered Medications  Medication Dose Route Frequency Provider Last Rate Last Admin   Chlorhexidine Gluconate Cloth 2 % PADS 6 each  6 each Topical Daily Coral Spikes, DO   6 each at 03/13/23 1030   docusate sodium (COLACE) capsule 100 mg  100 mg Oral BID PRN Cristopher Peru, PA-C       heparin injection 5,000 Units  5,000 Units Subcutaneous Q8H Cristopher Peru, PA-C   5,000 Units at 03/13/23 1500   HYDROcodone-acetaminophen (NORCO/VICODIN) 5-325 MG per tablet 1 tablet  1 tablet Oral Q6H PRN Cristopher Peru, PA-C   1 tablet at 03/13/23 1519   insulin aspart (novoLOG) injection 0-15 Units  0-15 Units Subcutaneous TID WC Henrene Hawking, NP   3 Units at 03/13/23 1156   insulin aspart (novoLOG) injection 0-5 Units  0-5 Units Subcutaneous QHS Henrene Hawking, NP       insulin glargine (LANTUS) injection 10 Units  10 Units Subcutaneous QHS Steffanie Dunn, DO       living well with diabetes book MISC   Does not apply Once Steffanie Dunn, DO       Oral care mouth rinse  15 mL Mouth Rinse PRN Estanislado Pandy J, DO       piperacillin-tazobactam (ZOSYN) IVPB 3.375 g  3.375 g Intravenous Q8H  Silvana Newness, RPH 12.5 mL/hr at 03/13/23 1513 3.375 g at 03/13/23 1513   polyethylene glycol (MIRALAX / GLYCOLAX) packet 17 g  17 g Oral Daily PRN Cristopher Peru, PA-C       rosuvastatin (CRESTOR) tablet 10 mg  10 mg Oral Daily Mannam, Praveen, MD   10 mg at 03/13/23 1610     ALLERGIES Adhesive [tape]  MEDICAL HISTORY Past Medical History:  Diagnosis Date    Diabetes mellitus without complication (HCC)    History of diverticulitis of colon 07/30/2014   07-30-2014  acute diverticulitis;  08-21-2014 recurrent diverticulitis w/ microperforation, 08-26-2014 s/p partial colectomy (post-op intra-abdominal abscesses and complication wound healing)   Hypertension    Right ureteral stone      SOCIAL HISTORY Social History   Socioeconomic History   Marital status: Single    Spouse name: Not on file   Number of children: Not on file   Years of education: Not on file   Highest education level: Not on file  Occupational History   Not on file  Tobacco Use   Smoking status: Never   Smokeless tobacco: Never  Vaping Use   Vaping status: Never Used  Substance and Sexual Activity   Alcohol use: No   Drug use: No   Sexual activity: Yes  Other Topics Concern   Not on file  Social History Narrative   Not on file   Social Drivers of Health   Financial Resource Strain: Not on file  Food Insecurity: No Food Insecurity (03/11/2023)   Hunger Vital Sign    Worried About Running Out of Food in the Last Year: Never true    Ran Out of Food in the Last Year: Never true  Transportation Needs: No Transportation Needs (03/11/2023)   PRAPARE - Transportation    Lack of Transportation (Medical): No    Lack of Transportation (Non-Medical): No  Physical Activity: Not on file  Stress: Not on file  Social Connections: Not on file  Intimate Partner Violence: Not At Risk (03/11/2023)   Humiliation, Afraid, Rape, and Kick questionnaire    Fear of Current or Ex-Partner: No    Emotionally Abused: No    Physically Abused: No    Sexually Abused: No     FAMILY HISTORY Family History  Problem Relation Age of Onset   Cancer Father        lung   Heart disease Father    Diabetes Maternal Grandmother     Review of Systems: 12 systems reviewed Otherwise as per HPI, all other systems reviewed and negative  Physical Exam: Vitals:   03/13/23 1400 03/13/23 1500   BP: 110/66 118/72  Pulse: 86 88  Resp: 20 17  Temp:    SpO2: 100% 100%   No intake/output data recorded.  Intake/Output Summary (Last 24 hours) at 03/13/2023 1533 Last data filed at 03/13/2023 0400 Gross per 24 hour  Intake 854.66 ml  Output 75 ml  Net 779.66 ml   General: well-appearing, no acute distress HEENT: anicteric sclera, oropharynx clear without lesions CV: regular rate, normal rhythm, no murmurs, no gallops, no rubs, no peripheral edema Lungs: clear to auscultation bilaterally, normal work of breathing Abd: soft, non-tender, non-distended, obese Skin: no visible lesions or rashes Psych: alert, engaged, appropriate mood and affect Musculoskeletal: no obvious deformities Neuro: normal speech, no gross focal deficits   Test Results Reviewed Lab Results  Component Value Date   NA 124 (L) 03/13/2023   K 3.6 03/13/2023  CL 91 (L) 03/13/2023   CO2 19 (L) 03/13/2023   BUN 43 (H) 03/13/2023   CREATININE 4.46 (H) 03/13/2023   CALCIUM 7.9 (L) 03/13/2023   ALBUMIN 4.3 03/11/2023   PHOS 5.8 (H) 03/13/2023     I have reviewed all relevant outside healthcare records related to the patient's kidney injury.

## 2023-03-13 NOTE — Plan of Care (Signed)
   Problem: Clinical Measurements: Goal: Will remain free from infection Outcome: Progressing Goal: Diagnostic test results will improve Outcome: Progressing Goal: Respiratory complications will improve Outcome: Progressing Goal: Cardiovascular complication will be avoided Outcome: Progressing   Problem: Activity: Goal: Risk for activity intolerance will decrease Outcome: Progressing

## 2023-03-13 NOTE — Inpatient Diabetes Management (Signed)
 Inpatient Diabetes Program Recommendations  AACE/ADA: New Consensus Statement on Inpatient Glycemic Control (2015)  Target Ranges:  Prepandial:   less than 140 mg/dL      Peak postprandial:   less than 180 mg/dL (1-2 hours)      Critically ill patients:  140 - 180 mg/dL   Lab Results  Component Value Date   GLUCAP 181 (H) 03/13/2023   HGBA1C >15.5 (H) 03/11/2023    Latest Reference Range & Units 06/10/19 13:09 12/28/21 23:19 03/11/23 21:19  Hemoglobin A1C 4.8 - 5.6 % 17.1 (H) >15.5 (H) >15.5 (H)  (H): Data is abnormally high  Review of Glycemic Control  Diabetes history: DM2 Outpatient Diabetes medications: Lantus 10 units daily, Metformin 1 gm bid, Glipizide XL 10 mg bid Current orders for Inpatient glycemic control: Novolog 0-15 units tid, 0-5 units hs  Inpatient Diabetes Program Recommendations:   Please consider: -Add Lantus 10 units daily  DM coordinator met with patient and his sister during admission on 12/30/21. A1c @ this time was also 15.5%. On office visit on 01/24/23 [facility] A1c was 12.9. Ordered Living Well With Diabetes book for patient review.  Thank you, Billy Fischer. Manuel Dall, RN, MSN, CDCES  Diabetes Coordinator Inpatient Glycemic Control Team Team Pager [phone] (8am-5pm) 03/13/2023 2:55 PM

## 2023-03-13 NOTE — Progress Notes (Signed)
 NAME:  Bryan [name], MRN:  [id number], DOB:  [birth year], LOS: 2 ADMISSION DATE:  03/11/2023, CONSULTATION DATE:  2/23 REFERRING MD:  Maple Hudson, CHIEF COMPLAINT:  sepsis   History of Present Illness:  53 year old male with past medical history of hypertension, diabetes who presented to [facility] emergency department on 2/23 with complaint of fatigue, nausea and vomiting, reportedly not urinated since Friday 2/21.  Labs notable for Na 126, Cl 83, BUN 42, sCr 5.39, AG 18, WBC 12.9, resp panel negative, lactic 1.9, UA without UTI, troponin x2 negative. Presented afebrile, hypotensive, non-tachycardic. CXR with no abnormalities. CT AP without acute findings. Was given 3.5L IVF volume resuscitation, cefepime/vancomycin/flagyl. Bladder scan with only 200cc. With ongoing hypotension, was started on levophed and CCM consulted for admission.   On my exam patient endorses most of the story above. States that on Wednesday/Thursday he bent over and "fel pretty hard," hurting his back. Has a long history of chronic low back pain for which he takes oxycodone. States afterward he did not feel right. Then since Friday has had minimal to no urine output. He states he has been drinking normally, however. Had one episode N/V Saturday which has since resolved denies abdominal pain, chest pain, shortness of breath. No back surgery. No fevers at home.  Pertinent  Medical History  Hypertension, diabetes  Significant Hospital Events: Including procedures, antibiotic start and stop dates in addition to other pertinent events   2/23: ED for n/v/fatigue>severe AKI>ccm of admit  2/24 off pressors, AKI improving  2/25 Patient remains off pressors, V/Q scan ordered, vascular ultrasound neg   Interim History / Subjective:  Remains off pressors, alert and oriented x 4 No complaints overnight   Objective   Blood pressure 95/70, pulse 88, temperature 98.1 F (36.7 C), temperature source Oral, resp. rate 14, height 5\' 10"   (1.778 m), weight 120.6 kg, SpO2 96%.        Intake/Output Summary (Last 24 hours) at 03/13/2023 1426 Last data filed at 03/13/2023 0400 Gross per 24 hour  Intake 1330.06 ml  Output 117 ml  Net 1213.06 ml   Filed Weights   03/11/23 1945 03/12/23 0500 03/13/23 0500  Weight: 120.2 kg 120.2 kg 120.6 kg    Examination: General: chronically ill middle aged adult male, sitting up in ICU bed, no distress HEENT: normocephalic, pink MM, teeth intact, PERRLA intact CV:s1,s2, RRR, no MRG, no JVD Abs: BS active, no distention  Extremities: no pitting edema, moves all extremities, pain decreased in lower extremities Bruising on left foot no signs of deformities  Neuro: AOx4, follows commands, RASS 0  GU: intact   Resolved Hospital Problem list    Assessment & Plan:  Shock, undifferentiated Hypovolemic vs septic vs obstructive?  2/23 Not clear shock picture. Fall last week with low back pain. Subsequent malaise, episode of vomiting, minimal UOP. Presents with significant AKI, hypotension. No clear source found on work up. UA negative, CXR without pneumonia. CT AP with no obvious source. Did have low grade 100 temp. WBC 12.9. lactic negative. Troponins flat and EKG without ischemia. Or home opiates/BP medications exaggerating presentation.  Procal 1.05, repeat lactic 2.1, MRSA neg 2/24 ECHO EF 55-60%, LV normal function no regional wall abnormalities, D-shaped interventricular septum suggestive of RV pressure/volume overload. Right ventricular systolic function is mildly reduced. The right ventricular size is normal.  RV pressure/overload-concern for PE but pt not hypoxic, no chest pain, or tachycardic  2/25 Vascular ultrasound Negative for DVTs in lower extremities, d-dimer 0.79, troponin 4,  BNP 88.3 2/24. Will order V/Q scan will low suspicion of PE, but no clear etiology of shock  TSH 0.686, cortisol 22.2,  P: Some hypotension overnight but not needing to restarting levophed  Continue LR  fluids at 146ml/hr for now  Will order V/Q Scan Since patient off pressors, mobilizing out of bed, will transfer out of ICU   AKI Hyponatremia  Appears baseline ~ 1.2-1.5. Presenting BUN 42, sCr 5.39. minimal to no UOP since 2/21. Bladder scan 200cc. CT AP without obstructive pathology. UA without UTI.  P: Continue to trend renal function daily  Continue to monitor and optimize electrolytes daily Continue to monitor urine output Continue strict I/Os Continue Adequate renal perfusion  Avoid nephrotoxic agents  Will obtain renal consult   Diabetes  Urine with ketones, AG 18, BG 221, bicarb 25 Betahydroxy 0.28  On metformin and glipizide  Hgb A1C 15.5 P: Continue moderate SSI with HS coverage Continue CBGs ACHS  Continue Heart healthy carb modified diet Diabetes Coordinator consult in and following Add semeglee 10 units once daily   Hypertension  Hyperlipidemia  P: Continue to hold antihypertensive meds at this time Continue LR  Continue statin   Best Practice (right click and "Reselect all SmartList Selections" daily)   Diet/type: NPO DVT prophylaxis: prophylactic heparin  Pressure ulcer(s): na GI prophylaxis: N/A Lines: Arterial Line and yes and it is still needed Foley:  Yes, and it is still needed Code Status:  full code Last date of multidisciplinary goals of care discussion [pending] Critical care time: 40 min     Christian Sriansh Farra AGACNP-BC   [facility] 03/13/2023, 2:26 PM  Please see [email or web address] for pager details.  From 7A-7P if no response, please call [phone]. After hours, please call ELink [phone].

## 2023-03-13 NOTE — Progress Notes (Signed)
 Transfer NOTE  Arrival Method: walked between beds, pt will notify family of new room Mental Orientation: alert and oriented x 4 Telemetry: NSR Assessment: Completed Skin: intact except bruising on feet IV: PIV and zosyn infusing Pain: pt denies Tubes: none Safety Measures: Safety Fall Prevention Plan has been given, discussed and signed Admission: Completed [facility] Orientation: Patient has been orientated to the room, unit and staff.  Family: not present  Orders have been reviewed and implemented. Will continue to monitor the patient. Call light has been placed within reach and bed alarm has been activated.   Irwin Brakeman, RN

## 2023-03-14 ENCOUNTER — Telehealth (HOSPITAL_COMMUNITY): Payer: Self-pay

## 2023-03-14 ENCOUNTER — Other Ambulatory Visit (HOSPITAL_COMMUNITY): Payer: Self-pay

## 2023-03-14 ENCOUNTER — Inpatient Hospital Stay (HOSPITAL_COMMUNITY): Payer: Medicaid Other

## 2023-03-14 DIAGNOSIS — R579 Shock, unspecified: Secondary | ICD-10-CM | POA: Insufficient documentation

## 2023-03-14 DIAGNOSIS — N179 Acute kidney failure, unspecified: Secondary | ICD-10-CM | POA: Diagnosis not present

## 2023-03-14 LAB — BASIC METABOLIC PANEL
Anion gap: 12 (ref 5–15)
Anion gap: 16 — ABNORMAL HIGH (ref 5–15)
BUN: 47 mg/dL — ABNORMAL HIGH (ref 6–20)
BUN: 50 mg/dL — ABNORMAL HIGH (ref 6–20)
CO2: 19 mmol/L — ABNORMAL LOW (ref 22–32)
CO2: 21 mmol/L — ABNORMAL LOW (ref 22–32)
Calcium: 7.6 mg/dL — ABNORMAL LOW (ref 8.9–10.3)
Calcium: 7.7 mg/dL — ABNORMAL LOW (ref 8.9–10.3)
Chloride: 91 mmol/L — ABNORMAL LOW (ref 98–111)
Chloride: 88 mmol/L — ABNORMAL LOW (ref 98–111)
Creatinine, Ser: 5.52 mg/dL — ABNORMAL HIGH (ref 0.61–1.24)
Creatinine, Ser: 6.29 mg/dL — ABNORMAL HIGH (ref 0.61–1.24)
GFR, Estimated: 11 mL/min — ABNORMAL LOW (ref >60)
GFR, Estimated: 10 mL/min — ABNORMAL LOW (ref >60)
Glucose, Bld: 198 mg/dL — ABNORMAL HIGH (ref 70–99)
Glucose, Bld: 147 mg/dL — ABNORMAL HIGH (ref 70–99)
Potassium: 4 mmol/L (ref 3.5–5.1)
Potassium: 4.3 mmol/L (ref 3.5–5.1)
Sodium: 122 mmol/L — ABNORMAL LOW (ref 135–145)
Sodium: 125 mmol/L — ABNORMAL LOW (ref 135–145)

## 2023-03-14 LAB — GLUCOSE, CAPILLARY
Glucose-Capillary: 177 mg/dL — ABNORMAL HIGH (ref 70–99)
Glucose-Capillary: 171 mg/dL — ABNORMAL HIGH (ref 70–99)
Glucose-Capillary: 193 mg/dL — ABNORMAL HIGH (ref 70–99)
Glucose-Capillary: 174 mg/dL — ABNORMAL HIGH (ref 70–99)

## 2023-03-14 LAB — CBC
HCT: 31.3 % — ABNORMAL LOW (ref 39.0–52.0)
Hemoglobin: 11 g/dL — ABNORMAL LOW (ref 13.0–17.0)
MCH: 27.1 pg (ref 26.0–34.0)
MCHC: 35.1 g/dL (ref 30.0–36.0)
MCV: 77.1 fL — ABNORMAL LOW (ref 80.0–100.0)
Platelets: 187 10*3/uL (ref 150–400)
RBC: 4.06 MIL/uL — ABNORMAL LOW (ref 4.22–5.81)
RDW: 13.5 % (ref 11.5–15.5)
WBC: 7.8 10*3/uL (ref 4.0–10.5)
nRBC: 0 % (ref 0.0–0.2)

## 2023-03-14 LAB — CK: Total CK: 157 U/L (ref 49–397)

## 2023-03-14 LAB — MAGNESIUM: Magnesium: 1.7 mg/dL (ref 1.7–2.4)

## 2023-03-14 LAB — PHOSPHORUS: Phosphorus: 7.1 mg/dL — ABNORMAL HIGH (ref 2.5–4.6)

## 2023-03-14 MED ORDER — PIPERACILLIN-TAZOBACTAM IN DEX 2-0.25 GM/50ML IV SOLN
2.2500 g | Freq: Three times a day (TID) | INTRAVENOUS | Status: DC
  Filled 2023-03-14 (×2): qty 50

## 2023-03-14 MED ORDER — PIPERACILLIN-TAZOBACTAM IN DEX 2-0.25 GM/50ML IV SOLN
2.2500 g | Freq: Three times a day (TID) | INTRAVENOUS | Status: DC
  Administered 2023-03-15: 2.25 g via INTRAVENOUS
  Filled 2023-03-14 (×2): qty 50

## 2023-03-14 MED ORDER — LACTATED RINGERS IV SOLN: INTRAVENOUS | Status: AC

## 2023-03-14 MED ORDER — PIPERACILLIN-TAZOBACTAM IN DEX 2-0.25 GM/50ML IV SOLN
2.2500 g | Freq: Three times a day (TID) | INTRAVENOUS | Status: DC
  Administered 2023-03-14: 2.25 g via INTRAVENOUS
  Filled 2023-03-14 (×3): qty 50

## 2023-03-14 NOTE — TOC CM/SW Note (Signed)
 Transition of Care [facility]) - Inpatient Brief Assessment   Patient Details  Name: Bryan [name] MRN: [id number] Date of Birth: 07-08-1970  Transition of Care [facility]) CM/SW Contact:    Tom-Johnson, Hershal Coria, RN Phone Number: 03/14/2023, 2:40 PM   Clinical Narrative:  Patient presented with Bilat Legs Weakness, Fall, decreased Urine Output, Malaise and Fatigue.  Patient found to be in Shock AKI and Hyponatremia. Creatinine on admit was 5.39, today at 5.52. Admit with ARF/Shock. On IV abx. Nephrology consulted, no Renal Replacement Therapy at this time.  From home with his mother. Does not have children, has two sisters that are supportive.  Has a cane, walker and shower seat at home.  PCP is Lazoff, Jovita Gamma, DO and uses [facility] in [place].  Sister, Pam to transport at discharge.    No TOC needs or recommendations noted at this time.  Patient not Medically ready for discharge.  CM will continue to follow as patient progresses with care towards discharge.                Transition of Care Asessment: Insurance and Status: Insurance coverage has been reviewed Patient has primary care physician: Yes Home environment has been reviewed: Yes Prior level of function:: Modified Independent Prior/Current Home Services: No current home services Social Drivers of Health Review: SDOH reviewed no interventions necessary Readmission risk has been reviewed: Yes Transition of care needs: no transition of care needs at this time

## 2023-03-14 NOTE — Progress Notes (Signed)
 PROGRESS NOTE    Bryan [name]  OZH:[id number] DOB: 01-18-1970 DOA: 03/11/2023 PCP: Mattie Marlin, DO   Brief Narrative: Bryan [name] is a 53 y.o. male with a history of diabetes, hypertension, hyperlipidemia.  Patient presented secondary to fatigue, nausea and vomiting and found to have AKI with associated hypotension and shock, requiring vasopressor support and ICU admission. Vasopressors weaned off. AKI persists.   Assessment and Plan:  Undifferentiated shock Possible hypovolemic vs septic. V/Q scan was negative for PE. Patient was managed in the ICU and treated with IV fluids and Levophed. Vasopressors weaned off on 2/23 and patient transferred out of the ICU on 2/25. Patient was treated empirically with Vancomycin/Cefepime/Flagyl and transitioned to Zosyn with recommendation for 7 days of treatment. -Continue Zosyn  AKI Baseline of about 1.2-1.5. Creatinine of 5.39 on admission. Nephrology consulted. Concern for possible ATN from shock. Creatinine rising up to 5.52 today.  -Nephrology recommendations: supportive care  Hyponatremia Secondary to AKI.  Nephrology on board. -Nephrology recommendations: fluid restriction, urine osmolality, urine sodium  Right ventricular reduced function Patient noted to have mildly reduced RV systolic function. No PE on V/Q scan.  Diabetes mellitus type 2 -Continue SSI -Continue insulin glargine 10 units  Primary hypertension Patient is on amlodipine, Coreg and lisinopril as an outpatient which were held secondary to shock.  Hyperlipidemia -Continue Crestor   DVT prophylaxis: Heparin Code Status:   Code Status: Full Code Family Communication: None at bedside Disposition Plan: Discharge pending improvement in AKI   Consultants:  PCCM Nephrology  Procedures:  Transthoracic Echocardiogram  Antimicrobials: Vancomycin Zosyn Cefepime Flagyl    Subjective: No concerns this morning.  Objective: BP 116/78  (BP Location: Left Arm)   Pulse 91   Temp 98.3 F (36.8 C) (Oral)   Resp 19   Ht 5\' 10"  (1.778 m)   Wt 120.6 kg   SpO2 99%   BMI 38.15 kg/m   Examination:  General exam: Appears calm and comfortable Respiratory system: Clear to auscultation. Respiratory effort normal. Cardiovascular system: S1 & S2 heard, RRR. No murmurs. Gastrointestinal system: Abdomen is nondistended, soft and nontender. Normal bowel sounds heard. Central nervous system: Alert and oriented. No focal neurological deficits. Psychiatry: Judgement and insight appear normal. Mood & affect appropriate.    Data Reviewed: I have personally reviewed following labs and imaging studies  CBC Lab Results  Component Value Date   WBC 7.8 03/14/2023   RBC 4.06 (L) 03/14/2023   HGB 11.0 (L) 03/14/2023   HCT 31.3 (L) 03/14/2023   MCV 77.1 (L) 03/14/2023   MCH 27.1 03/14/2023   PLT 187 03/14/2023   MCHC 35.1 03/14/2023   RDW 13.5 03/14/2023   LYMPHSABS 1.5 12/30/2021   MONOABS 0.6 12/30/2021   EOSABS 0.2 12/30/2021   BASOSABS 0.1 12/30/2021     Last metabolic panel Lab Results  Component Value Date   NA 122 (L) 03/14/2023   K 4.0 03/14/2023   CL 91 (L) 03/14/2023   CO2 19 (L) 03/14/2023   BUN 47 (H) 03/14/2023   CREATININE 5.52 (H) 03/14/2023   GLUCOSE 198 (H) 03/14/2023   GFRNONAA 11 (L) 03/14/2023   GFRAA >60 06/10/2019   CALCIUM 7.6 (L) 03/14/2023   PHOS 7.1 (H) 03/14/2023   PROT 8.1 03/11/2023   ALBUMIN 4.3 03/11/2023   BILITOT 0.7 03/11/2023   ALKPHOS 83 03/11/2023   AST 19 03/11/2023   ALT 10 03/11/2023   ANIONGAP 12 03/14/2023    GFR: Estimated Creatinine Clearance: 19.7  mL/min (A) (by C-G formula based on SCr of 5.52 mg/dL (H)).  Recent Results (from the past 240 hours)  Resp panel by RT-PCR (RSV, Flu A&B, Covid) Anterior Nasal Swab     Status: None   Collection Time: 03/11/23 11:16 AM   Specimen: Anterior Nasal Swab  Result Value Ref Range Status   SARS Coronavirus 2 by RT PCR  NEGATIVE NEGATIVE Final    Comment: (NOTE) SARS-CoV-2 target nucleic acids are NOT DETECTED.  The SARS-CoV-2 RNA is generally detectable in upper respiratory specimens during the acute phase of infection. The lowest concentration of SARS-CoV-2 viral copies this assay can detect is 138 copies/mL. A negative result does not preclude SARS-Cov-2 infection and should not be used as the sole basis for treatment or other patient management decisions. A negative result may occur with  improper specimen collection/handling, submission of specimen other than nasopharyngeal swab, presence of viral mutation(s) within the areas targeted by this assay, and inadequate number of viral copies(<138 copies/mL). A negative result must be combined with clinical observations, patient history, and epidemiological information. The expected result is Negative.  Fact Sheet for Patients:  BloggerCourse.com  Fact Sheet for Healthcare Providers:  SeriousBroker.it  This test is no t yet approved or cleared by the [place] FDA and  has been authorized for detection and/or diagnosis of SARS-CoV-2 by FDA under an Emergency Use Authorization (EUA). This EUA will remain  in effect (meaning this test can be used) for the duration of the COVID-19 declaration under Section 564(b)(1) of the Act, 21 U.S.C.section 360bbb-3(b)(1), unless the authorization is terminated  or revoked sooner.       Influenza A by PCR NEGATIVE NEGATIVE Final   Influenza B by PCR NEGATIVE NEGATIVE Final    Comment: (NOTE) The Xpert Xpress SARS-CoV-2/FLU/RSV plus assay is intended as an aid in the diagnosis of influenza from Nasopharyngeal swab specimens and should not be used as a sole basis for treatment. Nasal washings and aspirates are unacceptable for Xpert Xpress SARS-CoV-2/FLU/RSV testing.  Fact Sheet for Patients: BloggerCourse.com  Fact Sheet for  Healthcare Providers: SeriousBroker.it  This test is not yet approved or cleared by the [place] FDA and has been authorized for detection and/or diagnosis of SARS-CoV-2 by FDA under an Emergency Use Authorization (EUA). This EUA will remain in effect (meaning this test can be used) for the duration of the COVID-19 declaration under Section 564(b)(1) of the Act, 21 U.S.C. section 360bbb-3(b)(1), unless the authorization is terminated or revoked.     Resp Syncytial Virus by PCR NEGATIVE NEGATIVE Final    Comment: (NOTE) Fact Sheet for Patients: BloggerCourse.com  Fact Sheet for Healthcare Providers: SeriousBroker.it  This test is not yet approved or cleared by the [place] FDA and has been authorized for detection and/or diagnosis of SARS-CoV-2 by FDA under an Emergency Use Authorization (EUA). This EUA will remain in effect (meaning this test can be used) for the duration of the COVID-19 declaration under Section 564(b)(1) of the Act, 21 U.S.C. section 360bbb-3(b)(1), unless the authorization is terminated or revoked.  Performed at [facility], [address], [place], [place] [address]   Culture, blood (routine x 2)     Status: None (Preliminary result)   Collection Time: 03/11/23 11:50 AM   Specimen: BLOOD RIGHT ARM  Result Value Ref Range Status   Specimen Description   Final    BLOOD RIGHT ARM Performed at [facility], [address], [place], [place] [address]  Special Requests   Final    BOTTLES DRAWN AEROBIC AND ANAEROBIC Blood Culture results may not be optimal due to an excessive volume of blood received in culture bottles Performed at [facility], [address] [place], [place] [address]    Culture   Final    NO GROWTH 3 DAYS Performed at [facility], [address] [place], [place] [address]    Report Status PENDING   Incomplete  Culture, blood (routine x 2)     Status: None (Preliminary result)   Collection Time: 03/11/23 11:50 AM   Specimen: BLOOD LEFT ARM  Result Value Ref Range Status   Specimen Description   Final    BLOOD LEFT ARM Performed at [facility], [address], [place], [place] [address]    Special Requests   Final    BOTTLES DRAWN AEROBIC AND ANAEROBIC Blood Culture results may not be optimal due to an excessive volume of blood received in culture bottles Performed at [facility], [address] [place], [place] [address]    Culture   Final    NO GROWTH 3 DAYS Performed at [facility], [address], [place], [place] [address]    Report Status PENDING  Incomplete  MRSA Next Gen by PCR, Nasal     Status: None   Collection Time: 03/11/23  8:05 PM   Specimen: Nasal Mucosa; Nasal Swab  Result Value Ref Range Status   MRSA by PCR Next Gen NOT DETECTED NOT DETECTED Final    Comment: (NOTE) The GeneXpert MRSA Assay (FDA approved for NASAL specimens only), is one component of a comprehensive MRSA colonization surveillance program. It is not intended to diagnose MRSA infection nor to guide or monitor treatment for MRSA infections. Test performance is not FDA approved in patients less than 84 years old. Performed at [facility], [address] [place], [place] [address]   Urine Culture (for pregnant, neutropenic or urologic patients or patients with an indwelling urinary catheter)     Status: None   Collection Time: 03/11/23  8:11 PM   Specimen: Urine, Catheterized  Result Value Ref Range Status   Specimen Description URINE, CATHETERIZED  Final   Special Requests NONE  Final   Culture   Final    NO GROWTH Performed at [facility], [address] [place], [place] [address]    Report Status 03/12/2023 FINAL  Final      Radiology Studies: NM Pulmonary Perfusion Result Date: 03/13/2023 CLINICAL DATA:  Positive D-dimer, PE  suspected low to intermediate probability, respiratory distress EXAM: NUCLEAR MEDICINE PERFUSION LUNG SCAN TECHNIQUE: Perfusion images were obtained in multiple projections after intravenous injection of radiopharmaceutical. Ventilation scans intentionally deferred if perfusion scan and chest x-ray adequate for interpretation. RADIOPHARMACEUTICALS:  4.0 mCi Tc-45m MAA IV COMPARISON:  Chest x-ray 225.5 FINDINGS: Planar images of the lungs are obtained in multiple projections during the perfusion exam. There are no perfusion defects identified. IMPRESSION: 1. Normal perfusion scan.  No evidence of pulmonary embolus. Electronically Signed   By: Sharlet Salina M.D.   On: 03/13/2023 18:16   DG CHEST PORT 1 VIEW Result Date: 03/13/2023 CLINICAL DATA:  Respiratory distress EXAM: PORTABLE CHEST 1 VIEW COMPARISON:  Chest x-ray 03/11/2023 FINDINGS: The heart size and mediastinal contours are within normal limits. Both lungs are clear. The visualized skeletal structures are unremarkable. IMPRESSION: No active disease. Electronically Signed   By: Mcneil Sober.D.  On: 03/13/2023 17:09   VAS [place] LOWER EXTREMITY VENOUS (DVT) Result Date: 03/13/2023  Lower Venous DVT Study Patient Name:  HORTON ELLITHORPE  Date of Exam:   03/13/2023 Medical Rec #: [id number]               Accession #:    [id number] Date of Birth: 02-21-1970                Patient Gender: M Patient Age:   60 years Exam Location:  [facility] Procedure:      VAS [place] LOWER EXTREMITY VENOUS (DVT) Referring Phys: Karie Fetch --------------------------------------------------------------------------------  Indications: Edema. Other Indications: Recent injury/fall. Comparison Study: No prior exam. Performing Technologist: Fernande Bras  Examination Guidelines: A complete evaluation includes B-mode imaging, spectral Doppler, color Doppler, and power Doppler as needed of all accessible portions of each vessel. Bilateral testing is considered an  integral part of a complete examination. Limited examinations for reoccurring indications may be performed as noted. The reflux portion of the exam is performed with the patient in reverse Trendelenburg.  +---------+---------------+---------+-----------+----------+--------------+ RIGHT    CompressibilityPhasicitySpontaneityPropertiesThrombus Aging +---------+---------------+---------+-----------+----------+--------------+ CFV      Full           Yes      Yes                                 +---------+---------------+---------+-----------+----------+--------------+ SFJ      Full           Yes      Yes                                 +---------+---------------+---------+-----------+----------+--------------+ FV Prox  Full                                                        +---------+---------------+---------+-----------+----------+--------------+ FV Mid   Full                                                        +---------+---------------+---------+-----------+----------+--------------+ FV DistalFull                                                        +---------+---------------+---------+-----------+----------+--------------+ PFV      Full                                                        +---------+---------------+---------+-----------+----------+--------------+ POP      Full           Yes      Yes                                 +---------+---------------+---------+-----------+----------+--------------+  PTV      Full                                                        +---------+---------------+---------+-----------+----------+--------------+ PERO     Full                                                        +---------+---------------+---------+-----------+----------+--------------+   +---------+---------------+---------+-----------+----------+--------------+ LEFT     CompressibilityPhasicitySpontaneityPropertiesThrombus  Aging +---------+---------------+---------+-----------+----------+--------------+ CFV      Full           Yes      Yes                                 +---------+---------------+---------+-----------+----------+--------------+ SFJ      Full           Yes      Yes                                 +---------+---------------+---------+-----------+----------+--------------+ FV Prox  Full                                                        +---------+---------------+---------+-----------+----------+--------------+ FV Mid   Full                                                        +---------+---------------+---------+-----------+----------+--------------+ FV DistalFull                                                        +---------+---------------+---------+-----------+----------+--------------+ PFV      Full                                                        +---------+---------------+---------+-----------+----------+--------------+ POP      Full           Yes      Yes                                 +---------+---------------+---------+-----------+----------+--------------+ PTV      Full                                                        +---------+---------------+---------+-----------+----------+--------------+  PERO     Full                                                        +---------+---------------+---------+-----------+----------+--------------+     Summary: BILATERAL: - No evidence of deep vein thrombosis seen in the lower extremities, bilaterally. -No evidence of popliteal cyst, bilaterally.   *See table(s) above for measurements and observations. Electronically signed by Lemar Livings MD on 03/13/2023 at 3:30:30 PM.    Final       LOS: 3 days    Jacquelin Hawking, MD [facility] 03/14/2023, 5:41 PM   If 7PM-7AM, please contact night-coverage [email or web address]

## 2023-03-14 NOTE — Telephone Encounter (Signed)
 Pharmacy Patient Advocate Encounter  Insurance verification completed.    The patient is insured through  [facility] .     Ran test claim for DEXCOM CGM SENSOR and the current 30 day co-pay is REQUIRES PA.  Ran test claim for FREESTYLE CGM SENSOR and the current 30 day co-pay is REQUIRES PA.  This test claim was processed through [facility]- copay amounts may vary at other pharmacies due to pharmacy/plan contracts, or as the patient moves through the different stages of their insurance plan.

## 2023-03-14 NOTE — Telephone Encounter (Signed)
 Pharmacy Patient Advocate Encounter   Received notification from  Inpatient  that prior authorization for FreeStyle Libre 3 Sensor is required/requested.   Insurance verification completed.   The patient is insured through [facility] .   Per test claim: PA required; PA submitted to above mentioned insurance via CoverMyMeds Key/confirmation #/EOC [id number] Status is pending

## 2023-03-14 NOTE — Progress Notes (Signed)
 PHARMACY NOTE:  ANTIMICROBIAL RENAL DOSAGE ADJUSTMENT  Current antimicrobial regimen includes a mismatch between antimicrobial dosage and estimated renal function.  As per policy approved by the Pharmacy & Therapeutics and Medical Executive Committees, the antimicrobial dosage will be adjusted accordingly.  Current antimicrobial dosage:  Zosyn 3.375g IV q8 hours (extended infusion)  Indication: sepsis  Renal Function:  Estimated Creatinine Clearance: 19.7 mL/min (A) (by C-G formula based on SCr of 5.52 mg/dL (H)). []      On intermittent HD, scheduled: []      On CRRT    Antimicrobial dosage has been changed to:  Zosyn 2.25g IV q8 hours  Additional comments:   Thank you for allowing pharmacy to be a part of this patient's care.  Rexford Maus, PharmD, BCPS 03/14/2023 9:10 AM

## 2023-03-14 NOTE — Hospital Course (Addendum)
 Bryan [name] is a 53 y.o. male with a history of diabetes, hypertension, hyperlipidemia.  Patient presented secondary to fatigue, nausea and vomiting and found to have AKI with associated hypotension and shock, requiring vasopressor support and ICU admission. Vasopressors weaned off. Renal biopsy performed on 2/28 with results pending. AKI improving prior to discharge. Patient cleared for discharge home with close follow-up per nephrology recommendations.

## 2023-03-14 NOTE — Inpatient Diabetes Management (Signed)
 Inpatient Diabetes Program Recommendations  AACE/ADA: New Consensus Statement on Inpatient Glycemic Control (2015)  Target Ranges:  Prepandial:   less than 140 mg/dL      Peak postprandial:   less than 180 mg/dL (1-2 hours)      Critically ill patients:  140 - 180 mg/dL   Lab Results  Component Value Date   GLUCAP 171 (H) 03/14/2023   HGBA1C >15.5 (H) 03/11/2023    Review of Glycemic Control  Latest Reference Range & Units 03/13/23 08:39 03/13/23 11:47 03/13/23 16:22 03/13/23 21:41 03/14/23 08:26 03/14/23 12:11  Glucose-Capillary 70 - 99 mg/dL 409 (H) 811 (H) 914 (H) 227 (H) 177 (H) 171 (H)   Diabetes history: DM 2 Outpatient Diabetes medications: Lantus 10 units Daily (pt thought he was suppose to take 10 units tid with meals), Glipizide 10 mg bid, Metformin 1000 mg bid Current orders for Inpatient glycemic control:  Lantus 10 units qhs Novolog 0-15 units tid + hs  Spoke with pt and sister at bedside. Pt was under the assumption he was suppose to take lantus 10 units tid before the meals. I looked at pt last PCP visit in care everywhere and noticed he was prescribed lantus 10 units daily, glipizide 10 mg bid and metformin 1000 mg bid. Pt checks his glucose 2-3 times a week in the mornings and notices he is mostly in the 200 range. Pt reports he sees his PCP every 3 months for follow up. Pt also states at last visit the doctor prescribed wegovey injectable once a week that he has not started yet due to trying to get it covered by insurance. Discussed A1c results of >15.5% this admission. Pt is interested in a continuous glucose monitor. Will work with pharmacy in looking into this.  Running prior Authorization for [employer]. Pt will need reader for this device at time of discharge  Thanks,  Christena Deem RN, MSN, BC-ADM Inpatient Diabetes Coordinator Team Pager [phone] (8a-5p)

## 2023-03-14 NOTE — Progress Notes (Signed)
 [facility] Progress Note    Assessment/ Plan:   AKI -bl Cr around 1 -suspecting AKI is secondary to ATN from shock. No obstruction on initial CT.  -Cr up to 5.5 today, nonoliguric. Will check renal US -urine sediment with hgb positive on dip but no signficant RBCs/HPF. Checking CK. Also with WBC's/HPF but few bacteria. Would recommend stopping zosyn/PCN and switching to alternative agent especially if sepsis is not the likely cause of his shock -fluids: start LR @ 100cc/hr -continue with supportive care for now. No indication for renal replacement therapy at this junction -Avoid nephrotoxic medications including NSAIDs and iodinated intravenous contrast exposure unless the latter is absolutely indicated.  Preferred narcotic agents for pain control are hydromorphone, fentanyl, and methadone. Morphine should not be used. Avoid Baclofen and avoid oral sodium phosphate and magnesium citrate based laxatives / bowel preps. Continue strict Input and Output monitoring. Will monitor the patient closely with you and intervene or adjust therapy as indicated by changes in clinical status/labs    Hyponatremia -asymptomatic -suspecting this is related AKI -fluid restrict for now: 1.2L/day -isotonic fluid trial with plans to recheck na 2 hrs thereafter. Coordinated with floor RN   Shock -etiology unclear -off pressors -per primary -cont to hold home anti-HTNs   Acidosis -mild, monitor for now   Anemia -Transfuse for Hgb<7 g/dL -hgb stable/acceptable   Uncontrolled Diabetes Mellitus Type 2 with Hyperglycemia -per primary  Subjective:   Patient seen and examined bedside. UOP ~1.4L. no complaints. Feels queasy but denies any CP, SOB, loss of appetite, dygeusia, vomiting, swelling, SOB, dizziness. He reports that he is still urinating adequately. Pain in his RLE is a little better today   Objective:   BP 99/64 (BP Location: Right Arm)   Pulse 82   Temp 98 F (36.7 C) (Oral)    Resp 19   Ht 5\' 10"  (1.778 m)   Wt 120.6 kg   SpO2 98%   BMI 38.15 kg/m   Intake/Output Summary (Last 24 hours) at 03/14/2023 1125 Last data filed at 03/14/2023 1054 Gross per 24 hour  Intake 296 ml  Output 1451 ml  Net -1155 ml   Weight change: 0 kg  Physical Exam: Gen: NAD CVS: RRR Resp: CTA B/L Abd: soft, nt/nd, obese Ext: no sig edema Neuro: awake, alert  Imaging: NM Pulmonary Perfusion Result Date: 03/13/2023 CLINICAL DATA:  Positive D-dimer, PE suspected low to intermediate probability, respiratory distress EXAM: NUCLEAR MEDICINE PERFUSION LUNG SCAN TECHNIQUE: Perfusion images were obtained in multiple projections after intravenous injection of radiopharmaceutical. Ventilation scans intentionally deferred if perfusion scan and chest x-ray adequate for interpretation. RADIOPHARMACEUTICALS:  4.0 mCi Tc-43m MAA IV COMPARISON:  Chest x-ray 225.5 FINDINGS: Planar images of the lungs are obtained in multiple projections during the perfusion exam. There are no perfusion defects identified. IMPRESSION: 1. Normal perfusion scan.  No evidence of pulmonary embolus. Electronically Signed   By: Sharlet Salina M.D.   On: 03/13/2023 18:16   DG CHEST PORT 1 VIEW Result Date: 03/13/2023 CLINICAL DATA:  Respiratory distress EXAM: PORTABLE CHEST 1 VIEW COMPARISON:  Chest x-ray 03/11/2023 FINDINGS: The heart size and mediastinal contours are within normal limits. Both lungs are clear. The visualized skeletal structures are unremarkable. IMPRESSION: No active disease. Electronically Signed   By: Darliss Cheney M.D.   On: 03/13/2023 17:09   VAS [place] LOWER EXTREMITY VENOUS (DVT) Result Date: 03/13/2023  Lower Venous DVT Study Patient Name:  Bryan [name]  Date of Exam:  03/13/2023 Medical Rec #: [id number]               Accession #:    [id number] Date of Birth: 06/27/1970                Patient Gender: M Patient Age:   53 years Exam Location:  [facility] - Closed Procedure:      VAS [place] LOWER  EXTREMITY VENOUS (DVT) Referring Phys: Karie Fetch --------------------------------------------------------------------------------  Indications: Edema. Other Indications: Recent injury/fall. Comparison Study: No prior exam. Performing Technologist: Fernande Bras  Examination Guidelines: A complete evaluation includes B-mode imaging, spectral Doppler, color Doppler, and power Doppler as needed of all accessible portions of each vessel. Bilateral testing is considered an integral part of a complete examination. Limited examinations for reoccurring indications may be performed as noted. The reflux portion of the exam is performed with the patient in reverse Trendelenburg.  +---------+---------------+---------+-----------+----------+--------------+ RIGHT    CompressibilityPhasicitySpontaneityPropertiesThrombus Aging +---------+---------------+---------+-----------+----------+--------------+ CFV      Full           Yes      Yes                                 +---------+---------------+---------+-----------+----------+--------------+ SFJ      Full           Yes      Yes                                 +---------+---------------+---------+-----------+----------+--------------+ FV Prox  Full                                                        +---------+---------------+---------+-----------+----------+--------------+ FV Mid   Full                                                        +---------+---------------+---------+-----------+----------+--------------+ FV DistalFull                                                        +---------+---------------+---------+-----------+----------+--------------+ PFV      Full                                                        +---------+---------------+---------+-----------+----------+--------------+ POP      Full           Yes      Yes                                  +---------+---------------+---------+-----------+----------+--------------+ PTV      Full                                                        +---------+---------------+---------+-----------+----------+--------------+  PERO     Full                                                        +---------+---------------+---------+-----------+----------+--------------+   +---------+---------------+---------+-----------+----------+--------------+ LEFT     CompressibilityPhasicitySpontaneityPropertiesThrombus Aging +---------+---------------+---------+-----------+----------+--------------+ CFV      Full           Yes      Yes                                 +---------+---------------+---------+-----------+----------+--------------+ SFJ      Full           Yes      Yes                                 +---------+---------------+---------+-----------+----------+--------------+ FV Prox  Full                                                        +---------+---------------+---------+-----------+----------+--------------+ FV Mid   Full                                                        +---------+---------------+---------+-----------+----------+--------------+ FV DistalFull                                                        +---------+---------------+---------+-----------+----------+--------------+ PFV      Full                                                        +---------+---------------+---------+-----------+----------+--------------+ POP      Full           Yes      Yes                                 +---------+---------------+---------+-----------+----------+--------------+ PTV      Full                                                        +---------+---------------+---------+-----------+----------+--------------+ PERO     Full                                                         +---------+---------------+---------+-----------+----------+--------------+  Summary: BILATERAL: - No evidence of deep vein thrombosis seen in the lower extremities, bilaterally. -No evidence of popliteal cyst, bilaterally.   *See table(s) above for measurements and observations. Electronically signed by Lemar Livings MD on 03/13/2023 at 3:30:30 PM.    Final     Labs: BMET Recent Labs  Lab 03/11/23 1111 03/11/23 1234 03/11/23 2119 03/12/23 1024 03/13/23 0222 03/14/23 0419  NA 126* 128* 124* 124* 124* 122*  K 3.6 3.7 3.5 4.2 3.6 4.0  CL 83*  --  88* 90* 91* 91*  CO2 25  --  19* 22 19* 19*  GLUCOSE 221*  --  316* 191* 157* 198*  BUN 42*  --  40* 40* 43* 47*  CREATININE 5.39*  --  4.64* 4.26* 4.46* 5.52*  CALCIUM 9.0  --  7.7* 7.8* 7.9* 7.6*  PHOS  --   --  5.7* 5.2* 5.8* 7.1*   CBC Recent Labs  Lab 03/11/23 2119 03/12/23 1024 03/13/23 0222 03/14/23 0419  WBC 13.5* 10.3 8.8 7.8  HGB 12.4* 11.1* 11.1* 11.0*  HCT 35.1* 30.9* 32.4* 31.3*  MCV 80.5 82.0 78.3* 77.1*  PLT 213 160 175 187    Medications:     Chlorhexidine Gluconate Cloth  6 each Topical Daily   heparin  5,000 Units Subcutaneous Q8H   insulin aspart  0-15 Units Subcutaneous TID WC   insulin aspart  0-5 Units Subcutaneous QHS   insulin glargine  10 Units Subcutaneous QHS   living well with diabetes book   Does not apply Once   rosuvastatin  10 mg Oral Daily      Anthony Sar, MD [facility] 03/14/2023, 11:25 AM

## 2023-03-14 NOTE — Telephone Encounter (Signed)
 Pharmacy Patient Advocate Encounter  Received notification from [facility] MEDICAID that Prior Authorization for [facility] [employer] has been APPROVED from 03/14/2023 to 08/22/2023. Ran test claim, Copay is $4.00. This test claim was processed through [facility]- copay amounts may vary at other pharmacies due to pharmacy/plan contracts, or as the patient moves through the different stages of their insurance plan.   PA #/Case ID/Reference #: [id number]

## 2023-03-15 ENCOUNTER — Encounter (HOSPITAL_COMMUNITY): Payer: Self-pay | Admitting: Internal Medicine

## 2023-03-15 DIAGNOSIS — N179 Acute kidney failure, unspecified: Secondary | ICD-10-CM | POA: Diagnosis not present

## 2023-03-15 LAB — PROTEIN / CREATININE RATIO, URINE
Creatinine, Urine: 28 mg/dL
Protein Creatinine Ratio: 0.86 mg/mg{creat} — ABNORMAL HIGH (ref 0.00–0.15)
Total Protein, Urine: 24 mg/dL

## 2023-03-15 LAB — GLUCOSE, CAPILLARY
Glucose-Capillary: 117 mg/dL — ABNORMAL HIGH (ref 70–99)
Glucose-Capillary: 154 mg/dL — ABNORMAL HIGH (ref 70–99)
Glucose-Capillary: 187 mg/dL — ABNORMAL HIGH (ref 70–99)
Glucose-Capillary: 169 mg/dL — ABNORMAL HIGH (ref 70–99)

## 2023-03-15 NOTE — Progress Notes (Signed)
 PROGRESS NOTE    Bryan [name]  ZOX:[id number] DOB: 1970/07/12 DOA: 03/11/2023 PCP: Mattie Marlin, DO   Brief Narrative: Bryan [name] is a 53 y.o. male with a history of diabetes, hypertension, hyperlipidemia.  Patient presented secondary to fatigue, nausea and vomiting and found to have AKI with associated hypotension and shock, requiring vasopressor support and ICU admission. Vasopressors weaned off. AKI persists.   Assessment and Plan:  Undifferentiated shock Possible hypovolemic vs septic. V/Q scan was negative for PE. Patient was managed in the ICU and treated with IV fluids and Levophed. Vasopressors weaned off on 2/23 and patient transferred out of the ICU on 2/25. Patient was treated empirically with Vancomycin/Cefepime/Flagyl and transitioned to Zosyn with recommendation for 7 days of treatment. Will hold Zosyn secondary to concern for possible AIN.  AKI Baseline of about 1.2-1.5. Creatinine of 5.39 on admission. Nephrology consulted. Concern for possible ATN from shock. Creatinine rising up to 6.29 today.  -Nephrology recommendations: supportive care, renal biopsy  Hyponatremia Secondary to AKI.  Nephrology on board. Sodium improved to 125. -Nephrology recommendations: IV fluids  Right ventricular reduced function Patient noted to have mildly reduced RV systolic function. No PE on V/Q scan.  Diabetes mellitus type 2 -Continue SSI -Continue insulin glargine 10 units  Primary hypertension Patient is on amlodipine, Coreg and lisinopril as an outpatient which were held secondary to shock.  Hyperlipidemia -Continue Crestor   DVT prophylaxis: Heparin Code Status:   Code Status: Full Code Family Communication: None at bedside Disposition Plan: Discharge pending improvement in AKI   Consultants:  PCCM Nephrology  Procedures:  Transthoracic Echocardiogram  Antimicrobials: Vancomycin Zosyn Cefepime Flagyl    Subjective: No concerns this  morning. Feels fair.  Objective: BP 135/74 (BP Location: Left Arm)   Pulse 95   Temp 98.2 F (36.8 C) (Oral)   Resp 18   Ht 5\' 10"  (1.778 m)   Wt 120.6 kg   SpO2 97%   BMI 38.15 kg/m   Examination:  General exam: Appears calm and comfortable Respiratory system: Clear to auscultation. Respiratory effort normal. Cardiovascular system: S1 & S2 heard, RRR. No murmurs. Gastrointestinal system: Abdomen is nondistended, soft and nontender. Normal bowel sounds heard. Central nervous system: Alert and oriented. No focal neurological deficits. Psychiatry: Judgement and insight appear normal. Mood & affect appropriate.    Data Reviewed: I have personally reviewed following labs and imaging studies  CBC Lab Results  Component Value Date   WBC 7.8 03/14/2023   RBC 4.06 (L) 03/14/2023   HGB 11.0 (L) 03/14/2023   HCT 31.3 (L) 03/14/2023   MCV 77.1 (L) 03/14/2023   MCH 27.1 03/14/2023   PLT 187 03/14/2023   MCHC 35.1 03/14/2023   RDW 13.5 03/14/2023   LYMPHSABS 1.5 12/30/2021   MONOABS 0.6 12/30/2021   EOSABS 0.2 12/30/2021   BASOSABS 0.1 12/30/2021     Last metabolic panel Lab Results  Component Value Date   NA 125 (L) 03/14/2023   K 4.3 03/14/2023   CL 88 (L) 03/14/2023   CO2 21 (L) 03/14/2023   BUN 50 (H) 03/14/2023   CREATININE 6.29 (H) 03/14/2023   GLUCOSE 147 (H) 03/14/2023   GFRNONAA 10 (L) 03/14/2023   GFRAA >60 06/10/2019   CALCIUM 7.7 (L) 03/14/2023   PHOS 7.1 (H) 03/14/2023   PROT 8.1 03/11/2023   ALBUMIN 4.3 03/11/2023   BILITOT 0.7 03/11/2023   ALKPHOS 83 03/11/2023   AST 19 03/11/2023   ALT 10 03/11/2023  ANIONGAP 16 (H) 03/14/2023    GFR: Estimated Creatinine Clearance: 17.3 mL/min (A) (by C-G formula based on SCr of 6.29 mg/dL (H)).  Recent Results (from the past 240 hours)  Resp panel by RT-PCR (RSV, Flu A&B, Covid) Anterior Nasal Swab     Status: None   Collection Time: 03/11/23 11:16 AM   Specimen: Anterior Nasal Swab  Result Value Ref  Range Status   SARS Coronavirus 2 by RT PCR NEGATIVE NEGATIVE Final    Comment: (NOTE) SARS-CoV-2 target nucleic acids are NOT DETECTED.  The SARS-CoV-2 RNA is generally detectable in upper respiratory specimens during the acute phase of infection. The lowest concentration of SARS-CoV-2 viral copies this assay can detect is 138 copies/mL. A negative result does not preclude SARS-Cov-2 infection and should not be used as the sole basis for treatment or other patient management decisions. A negative result may occur with  improper specimen collection/handling, submission of specimen other than nasopharyngeal swab, presence of viral mutation(s) within the areas targeted by this assay, and inadequate number of viral copies(<138 copies/mL). A negative result must be combined with clinical observations, patient history, and epidemiological information. The expected result is Negative.  Fact Sheet for Patients:  BloggerCourse.com  Fact Sheet for Healthcare Providers:  SeriousBroker.it  This test is no t yet approved or cleared by the [place] FDA and  has been authorized for detection and/or diagnosis of SARS-CoV-2 by FDA under an Emergency Use Authorization (EUA). This EUA will remain  in effect (meaning this test can be used) for the duration of the COVID-19 declaration under Section 564(b)(1) of the Act, 21 U.S.C.section 360bbb-3(b)(1), unless the authorization is terminated  or revoked sooner.       Influenza A by PCR NEGATIVE NEGATIVE Final   Influenza B by PCR NEGATIVE NEGATIVE Final    Comment: (NOTE) The Xpert Xpress SARS-CoV-2/FLU/RSV plus assay is intended as an aid in the diagnosis of influenza from Nasopharyngeal swab specimens and should not be used as a sole basis for treatment. Nasal washings and aspirates are unacceptable for Xpert Xpress SARS-CoV-2/FLU/RSV testing.  Fact Sheet for  Patients: BloggerCourse.com  Fact Sheet for Healthcare Providers: SeriousBroker.it  This test is not yet approved or cleared by the [place] FDA and has been authorized for detection and/or diagnosis of SARS-CoV-2 by FDA under an Emergency Use Authorization (EUA). This EUA will remain in effect (meaning this test can be used) for the duration of the COVID-19 declaration under Section 564(b)(1) of the Act, 21 U.S.C. section 360bbb-3(b)(1), unless the authorization is terminated or revoked.     Resp Syncytial Virus by PCR NEGATIVE NEGATIVE Final    Comment: (NOTE) Fact Sheet for Patients: BloggerCourse.com  Fact Sheet for Healthcare Providers: SeriousBroker.it  This test is not yet approved or cleared by the [place] FDA and has been authorized for detection and/or diagnosis of SARS-CoV-2 by FDA under an Emergency Use Authorization (EUA). This EUA will remain in effect (meaning this test can be used) for the duration of the COVID-19 declaration under Section 564(b)(1) of the Act, 21 U.S.C. section 360bbb-3(b)(1), unless the authorization is terminated or revoked.  Performed at [facility], [address], [place], [place] [address]   Culture, blood (routine x 2)     Status: None (Preliminary result)   Collection Time: 03/11/23 11:50 AM   Specimen: BLOOD RIGHT ARM  Result Value Ref Range Status   Specimen Description   Final    BLOOD RIGHT ARM Performed at [facility]  [facility], [address] [place], [place] [address]    Special Requests   Final    BOTTLES DRAWN AEROBIC AND ANAEROBIC Blood Culture results may not be optimal due to an excessive volume of blood received in culture bottles Performed at [facility], [address], [place], [place] [address]    Culture   Final    NO GROWTH 4 DAYS Performed at [facility], [address], [place], [place] [address]    Report Status PENDING  Incomplete  Culture, blood (routine x 2)     Status: None (Preliminary result)   Collection Time: 03/11/23 11:50 AM   Specimen: BLOOD LEFT ARM  Result Value Ref Range Status   Specimen Description   Final    BLOOD LEFT ARM Performed at [facility], [address], [place], [place] [address]    Special Requests   Final    BOTTLES DRAWN AEROBIC AND ANAEROBIC Blood Culture results may not be optimal due to an excessive volume of blood received in culture bottles Performed at [facility], [address], [place], [place] [address]    Culture   Final    NO GROWTH 4 DAYS Performed at [facility], [address] [place], [place] [address]    Report Status PENDING  Incomplete  MRSA Next Gen by PCR, Nasal     Status: None   Collection Time: 03/11/23  8:05 PM   Specimen: Nasal Mucosa; Nasal Swab  Result Value Ref Range Status   MRSA by PCR Next Gen NOT DETECTED NOT DETECTED Final    Comment: (NOTE) The GeneXpert MRSA Assay (FDA approved for NASAL specimens only), is one component of a comprehensive MRSA colonization surveillance program. It is not intended to diagnose MRSA infection nor to guide or monitor treatment for MRSA infections. Test performance is not FDA approved in patients less than 18 years old. Performed at [facility], [address], [place], [place] [address]   Urine Culture (for pregnant, neutropenic or urologic patients or patients with an indwelling urinary catheter)     Status: None   Collection Time: 03/11/23  8:11 PM   Specimen: Urine, Catheterized  Result Value Ref Range Status   Specimen Description URINE, CATHETERIZED  Final   Special Requests NONE  Final   Culture   Final    NO GROWTH Performed at [facility], [address], [place], [place] [address]    Report Status 03/12/2023 FINAL  Final      Radiology Studies: US RENAL Result  Date: 03/14/2023 CLINICAL DATA:  Acute kidney injury EXAM: RENAL / URINARY TRACT ULTRASOUND COMPLETE COMPARISON:  CT 03/11/2023 and older.  Renal ultrasound 2019 June. FINDINGS: Right Kidney: Renal measurements: 13.6 x 6.3 x 6.2 cm = volume: 278 mL. Echogenicity within normal limits. No mass or hydronephrosis visualized. Left Kidney: Renal measurements: 13.1 x 6.5 x 6.1 cm = volume: 271 mL. Echogenicity within normal limits. No mass or hydronephrosis visualized. Bladder: Appears normal for degree of bladder distention. Other: None. IMPRESSION: No collecting system dilatation. Electronically Signed   By: Karen Kays M.D.   On: 03/14/2023 17:49   NM Pulmonary Perfusion Result Date: 03/13/2023 CLINICAL DATA:  Positive D-dimer, PE suspected low to intermediate probability, respiratory distress EXAM: NUCLEAR MEDICINE PERFUSION LUNG SCAN TECHNIQUE: Perfusion images were obtained in multiple projections after intravenous injection of radiopharmaceutical. Ventilation scans intentionally deferred if perfusion scan and chest x-ray adequate  for interpretation. RADIOPHARMACEUTICALS:  4.0 mCi Tc-67m MAA IV COMPARISON:  Chest x-ray 225.5 FINDINGS: Planar images of the lungs are obtained in multiple projections during the perfusion exam. There are no perfusion defects identified. IMPRESSION: 1. Normal perfusion scan.  No evidence of pulmonary embolus. Electronically Signed   By: Sharlet Salina M.D.   On: 03/13/2023 18:16   DG CHEST PORT 1 VIEW Result Date: 03/13/2023 CLINICAL DATA:  Respiratory distress EXAM: PORTABLE CHEST 1 VIEW COMPARISON:  Chest x-ray 03/11/2023 FINDINGS: The heart size and mediastinal contours are within normal limits. Both lungs are clear. The visualized skeletal structures are unremarkable. IMPRESSION: No active disease. Electronically Signed   By: Darliss Cheney M.D.   On: 03/13/2023 17:09   VAS [place] LOWER EXTREMITY VENOUS (DVT) Result Date: 03/13/2023  Lower Venous DVT Study Patient Name:   HIEN PERREIRA  Date of Exam:   03/13/2023 Medical Rec #: [id number]               Accession #:    [id number] Date of Birth: April 14, 1970                Patient Gender: M Patient Age:   44 years Exam [facility]  [facility] Procedure:      VAS [place] LOWER EXTREMITY VENOUS (DVT) Referring Phys: Karie Fetch --------------------------------------------------------------------------------  Indications: Edema. Other Indications: Recent injury/fall. Comparison Study: No prior exam. Performing Technologist: Fernande Bras  Examination Guidelines: A complete evaluation includes B-mode imaging, spectral Doppler, color Doppler, and power Doppler as needed of all accessible portions of each vessel. Bilateral testing is considered an integral part of a complete examination. Limited examinations for reoccurring indications may be performed as noted. The reflux portion of the exam is performed with the patient in reverse Trendelenburg.  +---------+---------------+---------+-----------+----------+--------------+ RIGHT    CompressibilityPhasicitySpontaneityPropertiesThrombus Aging +---------+---------------+---------+-----------+----------+--------------+ CFV      Full           Yes      Yes                                 +---------+---------------+---------+-----------+----------+--------------+ SFJ      Full           Yes      Yes                                 +---------+---------------+---------+-----------+----------+--------------+ FV Prox  Full                                                        +---------+---------------+---------+-----------+----------+--------------+ FV Mid   Full                                                        +---------+---------------+---------+-----------+----------+--------------+ FV DistalFull                                                        +---------+---------------+---------+-----------+----------+--------------+  PFV       Full                                                        +---------+---------------+---------+-----------+----------+--------------+ POP      Full           Yes      Yes                                 +---------+---------------+---------+-----------+----------+--------------+ PTV      Full                                                        +---------+---------------+---------+-----------+----------+--------------+ PERO     Full                                                        +---------+---------------+---------+-----------+----------+--------------+   +---------+---------------+---------+-----------+----------+--------------+ LEFT     CompressibilityPhasicitySpontaneityPropertiesThrombus Aging +---------+---------------+---------+-----------+----------+--------------+ CFV      Full           Yes      Yes                                 +---------+---------------+---------+-----------+----------+--------------+ SFJ      Full           Yes      Yes                                 +---------+---------------+---------+-----------+----------+--------------+ FV Prox  Full                                                        +---------+---------------+---------+-----------+----------+--------------+ FV Mid   Full                                                        +---------+---------------+---------+-----------+----------+--------------+ FV DistalFull                                                        +---------+---------------+---------+-----------+----------+--------------+ PFV      Full                                                        +---------+---------------+---------+-----------+----------+--------------+  POP      Full           Yes      Yes                                 +---------+---------------+---------+-----------+----------+--------------+ PTV      Full                                                         +---------+---------------+---------+-----------+----------+--------------+ PERO     Full                                                        +---------+---------------+---------+-----------+----------+--------------+     Summary: BILATERAL: - No evidence of deep vein thrombosis seen in the lower extremities, bilaterally. -No evidence of popliteal cyst, bilaterally.   *See table(s) above for measurements and observations. Electronically signed by Lemar Livings MD on 03/13/2023 at 3:30:30 PM.    Final       LOS: 4 days    Jacquelin Hawking, MD [facility] 03/15/2023, 10:28 AM   If 7PM-7AM, please contact night-coverage [email or web address]

## 2023-03-15 NOTE — Plan of Care (Signed)

## 2023-03-15 NOTE — Progress Notes (Signed)
 [facility] Progress Note    Assessment/ Plan:   AKI -bl Cr around 1. Up to 6.29 today. Nonoliguric -suspecting AKI is secondary to ATN from shock. No obstruction on initial CT.  -Cr up to 5.5 today, nonoliguric. Will check renal US -urine sediment with hgb positive on dip but no signficant RBCs/HPF. CK WNL. Also with WBC's/HPF but few bacteria. Would recommend stopping zosyn/PCN and switching to alternative agent given concern for AIN -discussed with primary service -fluids: c/w LR @ 100cc/hr -given that we need to rule out AIN and rising Cr, I think we should proceed with renal biopsy. Discussed this in detail with patient which he agrees with. [place] biopsy req form in physical chart. IR consulted, appreciate assistance -No indication for renal replacement therapy as of today -Avoid nephrotoxic medications including NSAIDs and iodinated intravenous contrast exposure unless the latter is absolutely indicated.  Preferred narcotic agents for pain control are hydromorphone, fentanyl, and methadone. Morphine should not be used. Avoid Baclofen and avoid oral sodium phosphate and magnesium citrate based laxatives / bowel preps. Continue strict Input and Output monitoring. Will monitor the patient closely with you and intervene or adjust therapy as indicated by changes in clinical status/labs    Hyponatremia -asymptomatic -suspecting this is related AKI -fluid restrict for now: 1.2L/day -improving with LR, continue. Up to 125   Shock -etiology unclear -off pressors -per primary -cont to hold home anti-HTNs   Acidosis -mild, monitor for now   Anemia -Transfuse for Hgb<7 g/dL -hgb stable/acceptable   Uncontrolled Diabetes Mellitus Type 2 with Hyperglycemia -per primary  Subjective:   Patient seen and examined bedside. No complaints, feels well. No acute events. Still urinating. UOP charted: 900cc + 2 unmeasured voids.   Objective:   BP 124/86 (BP Location: Right  Arm)   Pulse 92   Temp 98 F (36.7 C) (Oral)   Resp 18   Ht 5\' 10"  (1.778 m)   Wt 120.6 kg   SpO2 97%   BMI 38.15 kg/m   Intake/Output Summary (Last 24 hours) at 03/15/2023 8119 Last data filed at 03/15/2023 0809 Gross per 24 hour  Intake 679.59 ml  Output 900 ml  Net -220.41 ml   Weight change:   Physical Exam: Gen: NAD CVS: RRR Resp: CTA B/L Abd: soft, nt/nd, obese Ext: no sig edema Neuro: awake, alert, no myoclonic jerking observed  Imaging: US RENAL Result Date: 03/14/2023 CLINICAL DATA:  Acute kidney injury EXAM: RENAL / URINARY TRACT ULTRASOUND COMPLETE COMPARISON:  CT 03/11/2023 and older.  Renal ultrasound 2019 June. FINDINGS: Right Kidney: Renal measurements: 13.6 x 6.3 x 6.2 cm = volume: 278 mL. Echogenicity within normal limits. No mass or hydronephrosis visualized. Left Kidney: Renal measurements: 13.1 x 6.5 x 6.1 cm = volume: 271 mL. Echogenicity within normal limits. No mass or hydronephrosis visualized. Bladder: Appears normal for degree of bladder distention. Other: None. IMPRESSION: No collecting system dilatation. Electronically Signed   By: Karen Kays M.D.   On: 03/14/2023 17:49   NM Pulmonary Perfusion Result Date: 03/13/2023 CLINICAL DATA:  Positive D-dimer, PE suspected low to intermediate probability, respiratory distress EXAM: NUCLEAR MEDICINE PERFUSION LUNG SCAN TECHNIQUE: Perfusion images were obtained in multiple projections after intravenous injection of radiopharmaceutical. Ventilation scans intentionally deferred if perfusion scan and chest x-ray adequate for interpretation. RADIOPHARMACEUTICALS:  4.0 mCi Tc-26m MAA IV COMPARISON:  Chest x-ray 225.5 FINDINGS: Planar images of the lungs are obtained in multiple projections during the perfusion exam. There are no perfusion defects  identified. IMPRESSION: 1. Normal perfusion scan.  No evidence of pulmonary embolus. Electronically Signed   By: Sharlet Salina M.D.   On: 03/13/2023 18:16   DG CHEST PORT 1  VIEW Result Date: 03/13/2023 CLINICAL DATA:  Respiratory distress EXAM: PORTABLE CHEST 1 VIEW COMPARISON:  Chest x-ray 03/11/2023 FINDINGS: The heart size and mediastinal contours are within normal limits. Both lungs are clear. The visualized skeletal structures are unremarkable. IMPRESSION: No active disease. Electronically Signed   By: Darliss Cheney M.D.   On: 03/13/2023 17:09   VAS [place] LOWER EXTREMITY VENOUS (DVT) Result Date: 03/13/2023  Lower Venous DVT Study Patient Name:  Bryan [name]  Date of Exam:   03/13/2023 Medical Rec #: [id number]               Accession #:    [id number] Date of Birth: [birth year]                Patient Gender: M Patient Age:   53 years Exam Location:  [facility] Procedure:      VAS [place] LOWER EXTREMITY VENOUS (DVT) Referring Phys: Karie Fetch --------------------------------------------------------------------------------  Indications: Edema. Other Indications: Recent injury/fall. Comparison Study: No prior exam. Performing Technologist: Fernande Bras  Examination Guidelines: A complete evaluation includes B-mode imaging, spectral Doppler, color Doppler, and power Doppler as needed of all accessible portions of each vessel. Bilateral testing is considered an integral part of a complete examination. Limited examinations for reoccurring indications may be performed as noted. The reflux portion of the exam is performed with the patient in reverse Trendelenburg.  +---------+---------------+---------+-----------+----------+--------------+ RIGHT    CompressibilityPhasicitySpontaneityPropertiesThrombus Aging +---------+---------------+---------+-----------+----------+--------------+ CFV      Full           Yes      Yes                                 +---------+---------------+---------+-----------+----------+--------------+ SFJ      Full           Yes      Yes                                  +---------+---------------+---------+-----------+----------+--------------+ FV Prox  Full                                                        +---------+---------------+---------+-----------+----------+--------------+ FV Mid   Full                                                        +---------+---------------+---------+-----------+----------+--------------+ FV DistalFull                                                        +---------+---------------+---------+-----------+----------+--------------+ PFV      Full                                                        +---------+---------------+---------+-----------+----------+--------------+  POP      Full           Yes      Yes                                 +---------+---------------+---------+-----------+----------+--------------+ PTV      Full                                                        +---------+---------------+---------+-----------+----------+--------------+ PERO     Full                                                        +---------+---------------+---------+-----------+----------+--------------+   +---------+---------------+---------+-----------+----------+--------------+ LEFT     CompressibilityPhasicitySpontaneityPropertiesThrombus Aging +---------+---------------+---------+-----------+----------+--------------+ CFV      Full           Yes      Yes                                 +---------+---------------+---------+-----------+----------+--------------+ SFJ      Full           Yes      Yes                                 +---------+---------------+---------+-----------+----------+--------------+ FV Prox  Full                                                        +---------+---------------+---------+-----------+----------+--------------+ FV Mid   Full                                                         +---------+---------------+---------+-----------+----------+--------------+ FV DistalFull                                                        +---------+---------------+---------+-----------+----------+--------------+ PFV      Full                                                        +---------+---------------+---------+-----------+----------+--------------+ POP      Full           Yes      Yes                                 +---------+---------------+---------+-----------+----------+--------------+  PTV      Full                                                        +---------+---------------+---------+-----------+----------+--------------+ PERO     Full                                                        +---------+---------------+---------+-----------+----------+--------------+     Summary: BILATERAL: - No evidence of deep vein thrombosis seen in the lower extremities, bilaterally. -No evidence of popliteal cyst, bilaterally.   *See table(s) above for measurements and observations. Electronically signed by Lemar Livings MD on 03/13/2023 at 3:30:30 PM.    Final     Labs: BMET Recent Labs  Lab 03/11/23 1111 03/11/23 1234 03/11/23 2119 03/12/23 1024 03/13/23 0222 03/14/23 0419 03/14/23 2322  NA 126* 128* 124* 124* 124* 122* 125*  K 3.6 3.7 3.5 4.2 3.6 4.0 4.3  CL 83*  --  88* 90* 91* 91* 88*  CO2 25  --  19* 22 19* 19* 21*  GLUCOSE 221*  --  316* 191* 157* 198* 147*  BUN 42*  --  40* 40* 43* 47* 50*  CREATININE 5.39*  --  4.64* 4.26* 4.46* 5.52* 6.29*  CALCIUM 9.0  --  7.7* 7.8* 7.9* 7.6* 7.7*  PHOS  --   --  5.7* 5.2* 5.8* 7.1*  --    CBC Recent Labs  Lab 03/11/23 2119 03/12/23 1024 03/13/23 0222 03/14/23 0419  WBC 13.5* 10.3 8.8 7.8  HGB 12.4* 11.1* 11.1* 11.0*  HCT 35.1* 30.9* 32.4* 31.3*  MCV 80.5 82.0 78.3* 77.1*  PLT 213 160 175 187    Medications:     Chlorhexidine Gluconate Cloth  6 each Topical Daily   heparin  5,000 Units  Subcutaneous Q8H   insulin aspart  0-15 Units Subcutaneous TID WC   insulin aspart  0-5 Units Subcutaneous QHS   insulin glargine  10 Units Subcutaneous QHS   living well with diabetes book   Does not apply Once   rosuvastatin  10 mg Oral Daily      Anthony Sar, MD [facility] 03/15/2023, 8:39 AM

## 2023-03-15 NOTE — Consult Note (Signed)
 Chief Complaint: AKI. Request is for random renal biopsy for further evaluation of acute interstitial nephritis.   Referring Physician(s): Dr. Anthony Sar  Supervising Physician: Roanna Banning  Patient Status: [facility] - In-pt  History of Present Illness: Bryan [name] is a 53 y.o. male inpatient. History of kidney stones, HTN, DM, Diverticulitis. Presented to the ED at [facility] on 2.23.25 with urinary retention, N/Vand fatigue X 3 days. Found to be in shock in AKI. Team is requesting a random renal biopsy for further evaluation of acute interstitial nephritis.   Currently without any significant complaints. Patient alert and laying in bed,calm. Denies any fevers, headache, chest pain, SOB, cough, abdominal pain, nausea, vomiting or bleeding.   BUN 50, Cr 6.29, GFR< 10. Last recorded B/P 135/74. CT Abd pelvis from 2.23.25 reads  Non obstructed kidneys. No nephrolithiasis. Normal ureters. Unremarkable bladder. Patient is on subcutaneous prophylactic dose of heparin. Allergies include tape.    Return precautions and treatment recommendations and follow-up discussed with the patient  who is agreeable with the plan.    Past Medical History:  Diagnosis Date   Diabetes mellitus without complication (HCC)    History of diverticulitis of colon 07/30/2014   07-30-2014  acute diverticulitis;  08-21-2014 recurrent diverticulitis w/ microperforation, 08-26-2014 s/p partial colectomy (post-op intra-abdominal abscesses and complication wound healing)   Hypertension    Right ureteral stone     Past Surgical History:  Procedure Laterality Date   APPENDECTOMY  1981   BIOPSY  12/29/2021   Procedure: BIOPSY;  Surgeon: Kathi Der, MD;  Location: [facility];  Service: Gastroenterology;;   CYSTOSCOPY/URETEROSCOPY/HOLMIUM LASER/STENT PLACEMENT Right 06/08/2017   Procedure: CYSTOSCOPY/RETROGRADE/URETEROSCOPY/HOLMIUM LASER/STENT PLACEMENT;  Surgeon: Rene Paci, MD;  Location:  [facility];  Service: Urology;  Laterality: Right;  ONLY NEEDS 45 MIN   ESOPHAGOGASTRODUODENOSCOPY (EGD) WITH PROPOFOL N/A 12/29/2021   Procedure: ESOPHAGOGASTRODUODENOSCOPY (EGD) WITH PROPOFOL;  Surgeon: Kathi Der, MD;  Location: [facility];  Service: Gastroenterology;  Laterality: N/A;   HOT HEMOSTASIS N/A 12/29/2021   Procedure: HOT HEMOSTASIS (ARGON PLASMA COAGULATION/BICAP);  Surgeon: Kathi Der, MD;  Location: [facility];  Service: Gastroenterology;  Laterality: N/A;   ILEOSTOMY CLOSURE N/A 12/31/2014   Procedure: ILEOSTOMY CLOSURE;  Surgeon: Harriette Bouillon, MD;  Location: [facility] OR;  Service: General;  Laterality: N/A;   INCISIONAL HERNIA REPAIR N/A 11/02/2015   Procedure: LAPAROSCOPIC INCISIONAL HERNIA;  Surgeon: Harriette Bouillon, MD;  Location: [facility] OR;  Service: General;  Laterality: N/A;   INSERTION OF MESH N/A 11/02/2015   Procedure: INSERTION OF MESH;  Surgeon: Harriette Bouillon, MD;  Location: [facility] OR;  Service: General;  Laterality: N/A;   LAPAROSCOPIC PARTIAL COLECTOMY N/A 08/26/2014   Procedure: LAPAROSCOPIC ASSISTED PARTIAL COLECTOMY WITH OSTOMY;  Surgeon: Harriette Bouillon, MD;  Location: [facility] OR;  Service: General;  Laterality: N/A;   SCLEROTHERAPY  12/29/2021   Procedure: Susa Day;  Surgeon: Kathi Der, MD;  Location: [facility];  Service: Gastroenterology;;    Allergies: Adhesive [tape]  Medications: Prior to Admission medications   Medication Sig Start Date End Date Taking? Authorizing Provider  acetaminophen (TYLENOL) 325 MG tablet Take 2 tablets (650 mg total) by mouth every 6 (six) hours as needed for mild pain (or temp > 100). Patient taking differently: Take 325 mg by mouth every 6 (six) hours as needed for mild pain (pain score 1-3), fever or headache. 08/31/14  Yes Jorje Guild N, PA-C  albuterol (VENTOLIN HFA) 108 (90 Base) MCG/ACT inhaler Inhale 2 puffs into the lungs every  6 (six) hours as needed for wheezing or shortness of  breath.   Yes [provider]  amLODipine (NORVASC) 10 MG tablet Take 1 tablet by mouth daily. 06/14/22  Yes [provider]  carvedilol (COREG) 25 MG tablet Take 25 mg by mouth 2 (two) times daily with a meal.   Yes [provider]  clobetasol (TEMOVATE) 0.05 % external solution Apply 1 Application topically 2 (two) times daily. 01/30/23  Yes [provider]  gabapentin (NEURONTIN) 300 MG capsule Take 300 mg by mouth in the morning. Total dose of 900 mg   Yes [provider]  gabapentin (NEURONTIN) 600 MG tablet Take 600 mg by mouth daily. Total dose of 900 mg 06/01/19  Yes [provider]  glipiZIDE (GLUCOTROL XL) 10 MG 24 hr tablet Take 10 mg by mouth 2 (two) times daily with a meal.   Yes [provider]  HYDROcodone-acetaminophen (NORCO/VICODIN) 5-325 MG tablet Take 1 tablet by mouth every 6 (six) hours as needed for severe pain (pain score 7-10) or moderate pain (pain score 4-6).   Yes [provider]  hydrOXYzine (ATARAX) 50 MG tablet Take 50 mg by mouth at bedtime as needed.   Yes [provider]  insulin glargine (LANTUS SOLOSTAR) 100 UNIT/ML Solostar Pen Inject 10 Units into the skin daily. 12/30/21 03/12/23 Yes Dahal, Melina Schools, MD  lisinopril (ZESTRIL) 40 MG tablet Take 40 mg by mouth daily. 02/24/22  Yes [provider]  metFORMIN (GLUCOPHAGE) 1000 MG tablet Take 1,000 mg by mouth in the morning and at bedtime.   Yes [provider]  minocycline (MINOCIN,DYNACIN) 50 MG capsule Take 50 mg by mouth daily as needed (for acne breakouts).   Yes [provider]  rosuvastatin (CRESTOR) 40 MG tablet Take 40 mg by mouth daily.   Yes [provider]  tiZANidine (ZANAFLEX) 4 MG tablet Take 4 mg by mouth every 8 (eight) hours as needed for muscle spasms.   Yes [provider]  WEGOVY 0.25 MG/0.5ML SOAJ Inject 0.25 mg into the skin once a week. 03/09/23  Yes [provider]   carvedilol (COREG) 12.5 MG tablet Take 12.5 mg by mouth in the morning and at bedtime. Patient not taking: Reported on 03/12/2023    [provider]  cyclobenzaprine (FLEXERIL) 10 MG tablet Take 10 mg by mouth 3 (three) times daily as needed for muscle spasms. Patient not taking: Reported on 03/12/2023    [provider]     Family History  Problem Relation Age of Onset   Cancer Father        lung   Heart disease Father    Diabetes Maternal Grandmother     Social History   Socioeconomic History   Marital status: Single    Spouse name: Not on file   Number of children: Not on file   Years of education: Not on file   Highest education level: Not on file  Occupational History   Not on file  Tobacco Use   Smoking status: Never   Smokeless tobacco: Never  Vaping Use   Vaping status: Never Used  Substance and Sexual Activity   Alcohol use: No   Drug use: No   Sexual activity: Yes  Other Topics Concern   Not on file  Social History Narrative   Not on file   Social Drivers of Health   Financial Resource Strain: Not on file  Food Insecurity: No Food Insecurity (03/11/2023)   Hunger Vital Sign  Worried About Programme researcher, broadcasting/film/video in the Last Year: Never true    Ran Out of Food in the Last Year: Never true  Transportation Needs: No Transportation Needs (03/11/2023)   PRAPARE - Administrator, Civil Service (Medical): No    Lack of Transportation (Non-Medical): No  Physical Activity: Not on file  Stress: Not on file  Social Connections: Not on file    Review of Systems: A 12 point ROS discussed and pertinent positives are indicated in the HPI above.  All other systems are negative.  Review of Systems  Constitutional:  Negative for fever.  HENT:  Negative for congestion.   Respiratory:  Negative for cough and shortness of breath.   Cardiovascular:  Negative for chest pain.  Gastrointestinal:  Negative for abdominal pain.  Neurological:   Negative for headaches.  Psychiatric/Behavioral:  Negative for behavioral problems and confusion.     Vital Signs: BP 135/74 (BP Location: Left Arm)   Pulse 95   Temp 98.2 F (36.8 C) (Oral)   Resp 18   Ht 5\' 10"  (1.778 m)   Wt 265 lb 14 oz (120.6 kg)   SpO2 97%   BMI 38.15 kg/m     Physical Exam Vitals and nursing note reviewed.  Constitutional:      Appearance: He is well-developed. He is obese.  HENT:     Head: Normocephalic.     Mouth/Throat:     Mouth: Mucous membranes are moist.  Cardiovascular:     Rate and Rhythm: Normal rate and regular rhythm.  Pulmonary:     Effort: Pulmonary effort is normal.  Musculoskeletal:        General: Normal range of motion.     Cervical back: Normal range of motion.  Skin:    General: Skin is warm and dry.  Neurological:     General: No focal deficit present.     Mental Status: He is alert and oriented to person, place, and time. Mental status is at baseline.  Psychiatric:        Mood and Affect: Mood normal.        Behavior: Behavior normal.        Thought Content: Thought content normal.        Judgment: Judgment normal.     Imaging: US RENAL Result Date: 03/14/2023 CLINICAL DATA:  Acute kidney injury EXAM: RENAL / URINARY TRACT ULTRASOUND COMPLETE COMPARISON:  CT 03/11/2023 and older.  Renal ultrasound 2019 June. FINDINGS: Right Kidney: Renal measurements: 13.6 x 6.3 x 6.2 cm = volume: 278 mL. Echogenicity within normal limits. No mass or hydronephrosis visualized. Left Kidney: Renal measurements: 13.1 x 6.5 x 6.1 cm = volume: 271 mL. Echogenicity within normal limits. No mass or hydronephrosis visualized. Bladder: Appears normal for degree of bladder distention. Other: None. IMPRESSION: No collecting system dilatation. Electronically Signed   By: Karen Kays M.D.   On: 03/14/2023 17:49   NM Pulmonary Perfusion Result Date: 03/13/2023 CLINICAL DATA:  Positive D-dimer, PE suspected low to intermediate probability, respiratory  distress EXAM: NUCLEAR MEDICINE PERFUSION LUNG SCAN TECHNIQUE: Perfusion images were obtained in multiple projections after intravenous injection of radiopharmaceutical. Ventilation scans intentionally deferred if perfusion scan and chest x-ray adequate for interpretation. RADIOPHARMACEUTICALS:  4.0 mCi Tc-48m MAA IV COMPARISON:  Chest x-ray 225.5 FINDINGS: Planar images of the lungs are obtained in multiple projections during the perfusion exam. There are no perfusion defects identified. IMPRESSION: 1. Normal perfusion scan.  No evidence of pulmonary embolus.  Electronically Signed   By: Sharlet Salina M.D.   On: 03/13/2023 18:16   DG CHEST PORT 1 VIEW Result Date: 03/13/2023 CLINICAL DATA:  Respiratory distress EXAM: PORTABLE CHEST 1 VIEW COMPARISON:  Chest x-ray 03/11/2023 FINDINGS: The heart size and mediastinal contours are within normal limits. Both lungs are clear. The visualized skeletal structures are unremarkable. IMPRESSION: No active disease. Electronically Signed   By: Darliss Cheney M.D.   On: 03/13/2023 17:09   VAS [place] LOWER EXTREMITY VENOUS (DVT) Result Date: 03/13/2023  Lower Venous DVT Study Patient Name:  Bryan [name]  Date of Exam:   03/13/2023 Medical Rec #: [id number]               Accession #:    [id number] Date of Birth: July 13, 1970                Patient Gender: M Patient Age:   20 years Exam Location:  [facility] Procedure:      VAS [place] LOWER EXTREMITY VENOUS (DVT) Referring Phys: Karie Fetch --------------------------------------------------------------------------------  Indications: Edema. Other Indications: Recent injury/fall. Comparison Study: No prior exam. Performing Technologist: Fernande Bras  Examination Guidelines: A complete evaluation includes B-mode imaging, spectral Doppler, color Doppler, and power Doppler as needed of all accessible portions of each vessel. Bilateral testing is considered an integral part of a complete examination. Limited examinations  for reoccurring indications may be performed as noted. The reflux portion of the exam is performed with the patient in reverse Trendelenburg.  +---------+---------------+---------+-----------+----------+--------------+ RIGHT    CompressibilityPhasicitySpontaneityPropertiesThrombus Aging +---------+---------------+---------+-----------+----------+--------------+ CFV      Full           Yes      Yes                                 +---------+---------------+---------+-----------+----------+--------------+ SFJ      Full           Yes      Yes                                 +---------+---------------+---------+-----------+----------+--------------+ FV Prox  Full                                                        +---------+---------------+---------+-----------+----------+--------------+ FV Mid   Full                                                        +---------+---------------+---------+-----------+----------+--------------+ FV DistalFull                                                        +---------+---------------+---------+-----------+----------+--------------+ PFV      Full                                                        +---------+---------------+---------+-----------+----------+--------------+  POP      Full           Yes      Yes                                 +---------+---------------+---------+-----------+----------+--------------+ PTV      Full                                                        +---------+---------------+---------+-----------+----------+--------------+ PERO     Full                                                        +---------+---------------+---------+-----------+----------+--------------+   +---------+---------------+---------+-----------+----------+--------------+ LEFT     CompressibilityPhasicitySpontaneityPropertiesThrombus Aging  +---------+---------------+---------+-----------+----------+--------------+ CFV      Full           Yes      Yes                                 +---------+---------------+---------+-----------+----------+--------------+ SFJ      Full           Yes      Yes                                 +---------+---------------+---------+-----------+----------+--------------+ FV Prox  Full                                                        +---------+---------------+---------+-----------+----------+--------------+ FV Mid   Full                                                        +---------+---------------+---------+-----------+----------+--------------+ FV DistalFull                                                        +---------+---------------+---------+-----------+----------+--------------+ PFV      Full                                                        +---------+---------------+---------+-----------+----------+--------------+ POP      Full           Yes      Yes                                 +---------+---------------+---------+-----------+----------+--------------+  PTV      Full                                                        +---------+---------------+---------+-----------+----------+--------------+ PERO     Full                                                        +---------+---------------+---------+-----------+----------+--------------+     Summary: BILATERAL: - No evidence of deep vein thrombosis seen in the lower extremities, bilaterally. -No evidence of popliteal cyst, bilaterally.   *See table(s) above for measurements and observations. Electronically signed by Lemar Livings MD on 03/13/2023 at 3:30:30 PM.    Final    ECHOCARDIOGRAM COMPLETE Result Date: 03/12/2023    ECHOCARDIOGRAM REPORT   Patient Name:   Bryan [name] Date of Exam: 03/12/2023 Medical Rec #:  [id number]              Height:       70.0 in Accession #:     [id number]             Weight:       265.0 lb Date of Birth:  1970-06-17               BSA:          2.352 m Patient Age:    53 years               BP:           90/72 mmHg Patient Gender: M                      HR:           90 bpm. Exam Location:  Inpatient Procedure: 2D Echo and Intracardiac Opacification Agent (Both Spectral and Color            Flow Doppler were utilized during procedure). Indications:    Shock  History:        Patient has no prior history of Echocardiogram examinations.                 Risk Factors:Non-Smoker, Hypertension and Diabetes.  Sonographer:    Dondra Prader RVT RCS Referring Phys: Cristopher Peru IMPRESSIONS  1. Left ventricular ejection fraction, by estimation, is 55 to 60%. The left ventricle has normal function. The left ventricle has no regional wall motion abnormalities. Left ventricular diastolic parameters are consistent with Grade I diastolic dysfunction (impaired relaxation).  2. D-shaped interventricular septum suggestive of RV pressure/volume overload. Right ventricular systolic function is mildly reduced. The right ventricular size is normal. Tricuspid regurgitation signal is inadequate for assessing PA pressure.  3. The mitral valve is normal in structure. No evidence of mitral valve regurgitation. No evidence of mitral stenosis.  4. The aortic valve is tricuspid. Aortic valve regurgitation is not visualized. No aortic stenosis is present.  5. The inferior vena cava is dilated in size with <50% respiratory variability, suggesting right atrial pressure of 15 mmHg. FINDINGS  Left Ventricle: Left ventricular ejection fraction, by estimation, is 55 to 60%. The left ventricle has normal function. The left  ventricle has no regional wall motion abnormalities. Definity contrast agent was given IV to delineate the left ventricular  endocardial borders. Strain imaging was not performed. The left ventricular internal cavity size was normal in size. There is no left ventricular  hypertrophy. Left ventricular diastolic parameters are consistent with Grade I diastolic dysfunction (impaired relaxation). Right Ventricle: D-shaped interventricular septum suggestive of RV pressure/volume overload. The right ventricular size is normal. No increase in right ventricular wall thickness. Right ventricular systolic function is mildly reduced. Tricuspid regurgitation signal is inadequate for assessing PA pressure. Left Atrium: Left atrial size was normal in size. Right Atrium: Right atrial size was normal in size. Pericardium: There is no evidence of pericardial effusion. Mitral Valve: The mitral valve is normal in structure. No evidence of mitral valve regurgitation. No evidence of mitral valve stenosis. Tricuspid Valve: The tricuspid valve is normal in structure. Tricuspid valve regurgitation is not demonstrated. Aortic Valve: The aortic valve is tricuspid. Aortic valve regurgitation is not visualized. No aortic stenosis is present. Aortic valve mean gradient measures 6.0 mmHg. Aortic valve peak gradient measures 10.5 mmHg. Aortic valve area, by VTI measures 2.23  cm. Pulmonic Valve: The pulmonic valve was normal in structure. Pulmonic valve regurgitation is not visualized. Aorta: The aortic root is normal in size and structure. Venous: The inferior vena cava is dilated in size with less than 50% respiratory variability, suggesting right atrial pressure of 15 mmHg. IAS/Shunts: No atrial level shunt detected by color flow Doppler. Additional Comments: 3D imaging was not performed.  LEFT VENTRICLE PLAX 2D LVIDd:         4.60 cm   Diastology LVIDs:         3.40 cm   LV e' medial:    11.10 cm/s LV PW:         1.00 cm   LV E/e' medial:  8.0 LV IVS:        1.10 cm   LV e' lateral:   12.10 cm/s LVOT diam:     2.00 cm   LV E/e' lateral: 7.3 LV SV:         56 LV SV Index:   24 LVOT Area:     3.14 cm  RIGHT VENTRICLE             IVC RV Basal diam:  3.30 cm     IVC diam: 2.30 cm RV S prime:     17.60 cm/s  TAPSE (M-mode): 1.4 cm LEFT ATRIUM             Index        RIGHT ATRIUM           Index LA diam:        3.70 cm 1.57 cm/m   RA Area:     10.70 cm LA Vol (A2C):   59.5 ml 25.29 ml/m  RA Volume:   21.90 ml  9.31 ml/m LA Vol (A4C):   60.5 ml 25.72 ml/m LA Biplane Vol: 59.7 ml 25.38 ml/m  AORTIC VALVE                     PULMONIC VALVE AV Area (Vmax):    2.13 cm      PV Vmax:       1.08 m/s AV Area (Vmean):   2.11 cm      PV Peak grad:  4.7 mmHg AV Area (VTI):     2.23 cm AV Vmax:  162.00 cm/s AV Vmean:          109.000 cm/s AV VTI:            0.252 m AV Peak Grad:      10.5 mmHg AV Mean Grad:      6.0 mmHg LVOT Vmax:         110.00 cm/s LVOT Vmean:        73.300 cm/s LVOT VTI:          0.179 m LVOT/AV VTI ratio: 0.71  AORTA Ao Root diam: 3.30 cm Ao Asc diam:  3.30 cm MITRAL VALVE MV Area (PHT): 4.68 cm    SHUNTS MV Decel Time: 162 msec    Systemic VTI:  0.18 m MV E velocity: 88.50 cm/s  Systemic Diam: 2.00 cm MV A velocity: 85.20 cm/s MV E/A ratio:  1.04 Dalton McleanMD Electronically signed by Wilfred Lacy Signature Date/Time: 03/12/2023/9:29:50 AM    Final    CT ABDOMEN PELVIS WO CONTRAST Result Date: 03/11/2023 CLINICAL DATA:  53 year old male with urinary retention, fatigue, low blood pressure. EXAM: CT ABDOMEN AND PELVIS WITHOUT CONTRAST TECHNIQUE: Multidetector CT imaging of the abdomen and pelvis was performed following the standard protocol without IV contrast. RADIATION DOSE REDUCTION: This exam was performed according to the departmental dose-optimization program which includes automated exposure control, adjustment of the mA and/or kV according to patient size and/or use of iterative reconstruction technique. COMPARISON:  CT Abdomen and Pelvis 09/26/2017. FINDINGS: Lower chest: No cardiomegaly or pericardial effusion. Mild linear lung base atelectasis or scarring. Hepatobiliary: Negative noncontrast liver and gallbladder. Pancreas: Partial fatty atrophy. Spleen: Negative.  Adrenals/Urinary Tract: Normal adrenal glands. Nonobstructed kidneys. No nephrolithiasis. Normal ureters. Unremarkable bladder. Stomach/Bowel: Chronic anastomosis at the junction of the descending and sigmoid colon with no adverse features. Mild large bowel retained stool. No large bowel inflammation identified. Evidence of previous appendectomy on series 2, image 82. Nondilated small bowel. Chronic right abdominal small bowel anastomosis on series 2, image 69 with no adverse features. Overlying chronic postoperative changes to the ventral abdominal wall there, probably a chronic ostomy takedown site. No ventral abdominal bowel hernia. Retained fluid and gas in the stomach. Duodenum is nondilated. No free air, free fluid, or mesenteric inflammation identified. Vascular/Lymphatic: Normal caliber abdominal aorta. Mild Aortoiliac calcified atherosclerosis. Vascular patency is not evaluated in the absence of IV contrast. No lymphadenopathy. Reproductive: Small fat containing inguinal hernias. Other: No pelvic free fluid. Musculoskeletal: No acute osseous abnormality identified. 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 Advanced chronic L4-L5 degeneration. IMPRESSION: 1. Chronic postoperative changes to the large and small bowel with no adverse features. 2. No urinary calculus. No acute or inflammatory process identified in the noncontrast abdomen or pelvis. 3. Mild Aortic Atherosclerosis (ICD10-I70.0). Electronically Signed   By: Odessa Fleming M.D.   On: 03/11/2023 12:33   DG Chest Portable 1 View Result Date: 03/11/2023 CLINICAL DATA:  Fatigue, hypertension. EXAM: PORTABLE CHEST 1 VIEW COMPARISON:  December 28, 2021. FINDINGS: The heart size and mediastinal contours are within normal limits. Hypoinflation of the lungs is noted. Both lungs are clear. The visualized skeletal structures are unremarkable. IMPRESSION: Hypoinflation of the lungs.  No active disease. Electronically Signed   By: Lupita Raider M.D.   On: 03/11/2023 11:54    Labs:  CBC: Recent Labs    03/11/23 2119 03/12/23 1024 03/13/23 0222 03/14/23 0419  WBC 13.5* 10.3 8.8 7.8  HGB 12.4* 11.1* 11.1* 11.0*  HCT 35.1* 30.9* 32.4* 31.3*  PLT 213 160 175 187    COAGS: No results for input(s): "INR", "APTT" in the last 8760 hours.  BMP: Recent Labs    03/12/23 1024 03/13/23 0222 03/14/23 0419 03/14/23 2322  NA 124* 124* 122* 125*  K 4.2 3.6 4.0 4.3  CL 90* 91* 91* 88*  CO2 22 19* 19* 21*  GLUCOSE 191* 157* 198* 147*  BUN 40* 43* 47* 50*  CALCIUM 7.8* 7.9* 7.6* 7.7*  CREATININE 4.26* 4.46* 5.52* 6.29*  GFRNONAA 16* 15* 11* 10*    LIVER FUNCTION TESTS: Recent Labs    03/11/23 1111  BILITOT 0.7  AST 19  ALT 10  ALKPHOS 83  PROT 8.1  ALBUMIN 4.3    Assessment and Plan:  53 y.o. male inpatient. History of kidney stones, HTN, DM, Diverticulitis. Presented to the ED at [facility] on 2.23.25 with urinary retention, N/Vand fatigue X 3 days. Found to be in shock in AKI. Team is requesting a random renal biopsy for further evaluation of acute interstitial nephritis.   PLAN: IR Image Guided Random Renal Biopsy  Risks and benefits of random renal biopsy was discussed with the patient and/or patient's family including, but not limited to bleeding, infection, damage to adjacent structures or low yield requiring additional tests.  All of the questions were answered and there is agreement to proceed.  Consent signed and in chart.   Thank you for this interesting consult.  I greatly enjoyed meeting Bryan [name] and look forward to participating in their care.  A copy of this report was sent to the requesting provider on this date.  Electronically Signed: Alene Mires, NP 03/15/2023, 9:49 AM   I spent a total of 40 Minutes    in face to face in clinical consultation, greater than 50% of which was counseling/coordinating care for random renal biopsy

## 2023-03-15 NOTE — Inpatient Diabetes Management (Signed)
 Inpatient Diabetes Program Recommendations  AACE/ADA: New Consensus Statement on Inpatient Glycemic Control (2015)  Target Ranges:  Prepandial:   less than 140 mg/dL      Peak postprandial:   less than 180 mg/dL (1-2 hours)      Critically ill patients:  140 - 180 mg/dL   Lab Results  Component Value Date   GLUCAP 154 (H) 03/15/2023   HGBA1C >15.5 (H) 03/11/2023   Review of Glycemic Control  Latest Reference Range & Units 03/14/23 08:26 03/14/23 12:11 03/14/23 17:28 03/14/23 20:36 03/15/23 07:59 03/15/23 11:25  Glucose-Capillary 70 - 99 mg/dL 161 (H) 096 (H) 045 (H) 174 (H) 117 (H) 154 (H)   Home medications:  Lantus 10 units Daily Glipizide 10 mg bid Metformin 1000 mg bid  Inpatient medication orders: Lantus 10 units qhs Novolog 0-15 units tid + hs  Discharge Recommendations: Other recommendations: Start: [employer] 3 Sensor order # [id number], will also need reader order # [id number].Continue Latus 10 units Daily, Wegovy 0.25 mg weekly, Metformin 1 gm bid   DISCONTINUE: Glipizide 10 mg bid.   Use Adult Diabetes Insulin Treatment Post Discharge order set.  Thanks,  Christena Deem RN, MSN, BC-ADM Inpatient Diabetes Coordinator Team Pager [phone] (8a-5p)

## 2023-03-16 ENCOUNTER — Inpatient Hospital Stay (HOSPITAL_COMMUNITY): Payer: Medicaid Other

## 2023-03-16 DIAGNOSIS — N179 Acute kidney failure, unspecified: Secondary | ICD-10-CM | POA: Diagnosis not present

## 2023-03-16 LAB — CBC
HCT: 35.3 % — ABNORMAL LOW (ref 39.0–52.0)
Hemoglobin: 12.6 g/dL — ABNORMAL LOW (ref 13.0–17.0)
MCH: 28 pg (ref 26.0–34.0)
MCHC: 35.7 g/dL (ref 30.0–36.0)
MCV: 78.4 fL — ABNORMAL LOW (ref 80.0–100.0)
Platelets: 338 10*3/uL (ref 150–400)
RBC: 4.5 MIL/uL (ref 4.22–5.81)
RDW: 13.9 % (ref 11.5–15.5)
WBC: 9.1 10*3/uL (ref 4.0–10.5)
nRBC: 0 % (ref 0.0–0.2)

## 2023-03-16 LAB — ANCA PROFILE
Anti-MPO Antibodies: <0.2 U (ref 0.0–0.9)
Anti-PR3 Antibodies: <0.2 U (ref 0.0–0.9)
Atypical P-ANCA titer: <1:20 {titer}
C-ANCA: <1:20 {titer}
P-ANCA: <1:20 {titer}

## 2023-03-16 LAB — MICROALBUMIN / CREATININE URINE RATIO
Creatinine, Urine: 25.4 mg/dL
Microalb Creat Ratio: 95 mg/g{creat} — ABNORMAL HIGH (ref 0–29)
Microalb, Ur: 24.1 ug/mL — ABNORMAL HIGH

## 2023-03-16 LAB — GLUCOSE, CAPILLARY
Glucose-Capillary: 116 mg/dL — ABNORMAL HIGH (ref 70–99)
Glucose-Capillary: 164 mg/dL — ABNORMAL HIGH (ref 70–99)
Glucose-Capillary: 164 mg/dL — ABNORMAL HIGH (ref 70–99)

## 2023-03-16 LAB — RENAL FUNCTION PANEL
Albumin: 2.4 g/dL — ABNORMAL LOW (ref 3.5–5.0)
Anion gap: 14 (ref 5–15)
BUN: 48 mg/dL — ABNORMAL HIGH (ref 6–20)
CO2: 21 mmol/L — ABNORMAL LOW (ref 22–32)
Calcium: 8.1 mg/dL — ABNORMAL LOW (ref 8.9–10.3)
Chloride: 95 mmol/L — ABNORMAL LOW (ref 98–111)
Creatinine, Ser: 6.75 mg/dL — ABNORMAL HIGH (ref 0.61–1.24)
GFR, Estimated: 9 mL/min — ABNORMAL LOW (ref >60)
Glucose, Bld: 147 mg/dL — ABNORMAL HIGH (ref 70–99)
Phosphorus: 7.9 mg/dL — ABNORMAL HIGH (ref 2.5–4.6)
Potassium: 4.4 mmol/L (ref 3.5–5.1)
Sodium: 130 mmol/L — ABNORMAL LOW (ref 135–145)

## 2023-03-16 LAB — CULTURE, BLOOD (ROUTINE X 2)

## 2023-03-16 LAB — PROTIME-INR
INR: 1.4 — ABNORMAL HIGH (ref 0.8–1.2)
Prothrombin Time: 17.4 s — ABNORMAL HIGH (ref 11.4–15.2)

## 2023-03-16 LAB — ANA W/REFLEX IF POSITIVE: Anti Nuclear Antibody (ANA): NEGATIVE

## 2023-03-16 MED ORDER — LIDOCAINE HCL (PF) 1 % IJ SOLN
5.0000 mL | Freq: Once | INTRAMUSCULAR | Status: AC
Start: 2023-03-16 — End: 2023-03-16
  Administered 2023-03-16: 5 mL

## 2023-03-16 MED ORDER — FENTANYL CITRATE (PF) 100 MCG/2ML IJ SOLN
INTRAMUSCULAR | Status: AC | PRN
  Administered 2023-03-16 (×2): 50 ug via INTRAVENOUS

## 2023-03-16 MED ORDER — MIDAZOLAM HCL 2 MG/2ML IJ SOLN
INTRAMUSCULAR | Status: AC
Start: 2023-03-16 — End: ?
  Filled 2023-03-16: qty 2

## 2023-03-16 MED ORDER — FENTANYL CITRATE (PF) 100 MCG/2ML IJ SOLN
INTRAMUSCULAR | Status: AC
  Filled 2023-03-16: qty 2

## 2023-03-16 MED ORDER — MIDAZOLAM HCL 2 MG/2ML IJ SOLN
INTRAMUSCULAR | Status: AC | PRN
  Administered 2023-03-16 (×2): 1 mg via INTRAVENOUS

## 2023-03-16 NOTE — Progress Notes (Addendum)
 [facility] Progress Note    Assessment/ Plan:   AKI -bl Cr around 1. Up to 6.29 today. Nonoliguric -suspecting AKI is secondary to ATN from shock. No obstruction on initial CT.  -Cr up to 5.5 yesterday, nonoliguric. renal US without obstruction -urine sediment with hgb positive on dip but no signficant RBCs/HPF. CK WNL. Also with WBC's/HPF but few bacteria. -fluids: Hold for now, encourage p.o. hydration in the interim -Proceeded with renal biopsy 2/27 given concerns for AIN.  Is off Zosyn now. -No indication for renal replacement therapy as of today -Pending labs.  GN serologies pending -Avoid nephrotoxic medications including NSAIDs and iodinated intravenous contrast exposure unless the latter is absolutely indicated.  Preferred narcotic agents for pain control are hydromorphone, fentanyl, and methadone. Morphine should not be used. Avoid Baclofen and avoid oral sodium phosphate and magnesium citrate based laxatives / bowel preps. Continue strict Input and Output monitoring. Will monitor the patient closely with you and intervene or adjust therapy as indicated by changes in clinical status/labs    Hyponatremia -asymptomatic -suspecting this is related AKI -fluid restrict for now: 1.2L/day -Labs pending   Shock -etiology unclear -off pressors -per primary -cont to hold home anti-HTNs   Acidosis -mild, monitor for now   Anemia -Transfuse for Hgb<7 g/dL -hgb stable/acceptable   Uncontrolled Diabetes Mellitus Type 2 with Hyperglycemia -per primary  Addendum: labs results. Cr continues to climb. Na better. Will keep him NPO after midnight just in case he has an indication to start HD and/or renal function worsens.  Subjective:   Patient seen and examined bedside. No complaints. S/p renal biopsy this AM, tolerated. Feels better today, he reports that he feels like he is urinating more.  He does report that his appetite is also better.  Denies any chest pain,  shortness of breath, swelling, loss of appetite, nausea/vomiting. RFP not drawn today, ordered and still pending   Objective:   BP 121/73 (BP Location: Left Arm)   Pulse 94   Temp 98.2 F (36.8 C) (Oral)   Resp 18   Ht 5\' 10"  (1.778 m)   Wt 121.5 kg   SpO2 99%   BMI 38.43 kg/m   Intake/Output Summary (Last 24 hours) at 03/16/2023 1142 Last data filed at 03/15/2023 1215 Gross per 24 hour  Intake 120 ml  Output --  Net 120 ml   Weight change:   Physical Exam: Gen: NAD CVS: RRR Resp: Normal work of breathing, unlabored Abd: soft, nt/nd, obese Ext: no sig edema Neuro: awake, alert, no myoclonic jerking observed  Imaging: US RENAL Result Date: 03/14/2023 CLINICAL DATA:  Acute kidney injury EXAM: RENAL / URINARY TRACT ULTRASOUND COMPLETE COMPARISON:  CT 03/11/2023 and older.  Renal ultrasound 2019 June. FINDINGS: Right Kidney: Renal measurements: 13.6 x 6.3 x 6.2 cm = volume: 278 mL. Echogenicity within normal limits. No mass or hydronephrosis visualized. Left Kidney: Renal measurements: 13.1 x 6.5 x 6.1 cm = volume: 271 mL. Echogenicity within normal limits. No mass or hydronephrosis visualized. Bladder: Appears normal for degree of bladder distention. Other: None. IMPRESSION: No collecting system dilatation. Electronically Signed   By: Karen Kays M.D.   On: 03/14/2023 17:49    Labs: BMET Recent Labs  Lab 03/11/23 1111 03/11/23 1234 03/11/23 2119 03/12/23 1024 03/13/23 0222 03/14/23 0419 03/14/23 2322  NA 126* 128* 124* 124* 124* 122* 125*  K 3.6 3.7 3.5 4.2 3.6 4.0 4.3  CL 83*  --  88* 90* 91* 91* 88*  CO2  25  --  19* 22 19* 19* 21*  GLUCOSE 221*  --  316* 191* 157* 198* 147*  BUN 42*  --  40* 40* 43* 47* 50*  CREATININE 5.39*  --  4.64* 4.26* 4.46* 5.52* 6.29*  CALCIUM 9.0  --  7.7* 7.8* 7.9* 7.6* 7.7*  PHOS  --   --  5.7* 5.2* 5.8* 7.1*  --    CBC Recent Labs  Lab 03/12/23 1024 03/13/23 0222 03/14/23 0419 03/16/23 0620  WBC 10.3 8.8 7.8 9.1  HGB 11.1*  11.1* 11.0* 12.6*  HCT 30.9* 32.4* 31.3* 35.3*  MCV 82.0 78.3* 77.1* 78.4*  PLT 160 175 187 338    Medications:     Chlorhexidine Gluconate Cloth  6 each Topical Daily   heparin  5,000 Units Subcutaneous Q8H   insulin aspart  0-15 Units Subcutaneous TID WC   insulin aspart  0-5 Units Subcutaneous QHS   insulin glargine  10 Units Subcutaneous QHS   living well with diabetes book   Does not apply Once   rosuvastatin  10 mg Oral Daily      Anthony Sar, MD [facility] 03/16/2023, 11:42 AM

## 2023-03-16 NOTE — Procedures (Signed)
 Interventional Radiology Procedure:   Indications: Acute kidney injury  Procedure: US guided random left renal biopsy  Findings: 2 core biopsies from left kidney lower pole.  Gelfoam slurry injected along needle tract.  No immediate bleeding.   Complications: None     EBL: Minimal  Plan: Bedrest 4 hours  Shiza Thelen R. Lowella Dandy, MD  Pager: [phone]

## 2023-03-16 NOTE — Progress Notes (Signed)
 PROGRESS NOTE    Bryan [name]  QMV:[id number] DOB: [birth year] DOA: 03/11/2023 PCP: Mattie Marlin, DO   Brief Narrative: Bryan [name] is a 53 y.o. male with a history of diabetes, hypertension, hyperlipidemia.  Patient presented secondary to fatigue, nausea and vomiting and found to have AKI with associated hypotension and shock, requiring vasopressor support and ICU admission. Vasopressors weaned off. AKI persists.   Assessment and Plan:  Undifferentiated shock Possible hypovolemic vs septic. V/Q scan was negative for PE. Patient was managed in the ICU and treated with IV fluids and Levophed. Vasopressors weaned off on 2/23 and patient transferred out of the ICU on 2/25. Patient was treated empirically with Vancomycin/Cefepime/Flagyl and transitioned to Zosyn with recommendation for 7 days of treatment. Will hold Zosyn secondary to concern for possible AIN.  AKI Baseline of about 1.2-1.5. Creatinine of 5.39 on admission. Nephrology consulted. Concern for possible ATN from shock. Creatinine rising up to 6.29; RFP pending today. Biopsy performed 2/28. -Nephrology recommendations: supportive care, follow-up biopsy results  Hyponatremia Secondary to AKI.  Nephrology on board. Sodium improved to 125. RFP pending today.  Right ventricular reduced function Patient noted to have mildly reduced RV systolic function. No PE on V/Q scan.  Diabetes mellitus type 2 Uncontrolled with hyperglycemia and hemoglobin A1C >15.5% -Continue SSI -Continue insulin glargine 10 units  Primary hypertension Patient is on amlodipine, Coreg and lisinopril as an outpatient which were held secondary to shock.  Hyperlipidemia -Continue Crestor   DVT prophylaxis: Heparin Code Status:   Code Status: Full Code Family Communication: None at bedside Disposition Plan: Discharge pending improvement in AKI and continued nephrology recommendations/management   Consultants:   PCCM Nephrology  Procedures:  Transthoracic Echocardiogram  Antimicrobials: Vancomycin Zosyn Cefepime Flagyl    Subjective: No issues. Just had his biopsy.  Objective: BP 121/73 (BP Location: Left Arm)   Pulse 94   Temp 98.2 F (36.8 C) (Oral)   Resp 18   Ht 5\' 10"  (1.778 m)   Wt 121.5 kg   SpO2 99%   BMI 38.43 kg/m   Examination:  General exam: Appears calm and comfortable Respiratory system: Clear to auscultation. Respiratory effort normal. Cardiovascular system: S1 & S2 heard, RRR. No murmurs. Gastrointestinal system: Abdomen is nondistended, soft and nontender. Normal bowel sounds heard. Central nervous system: Alert and oriented. No focal neurological deficits. Psychiatry: Judgement and insight appear normal. Mood & affect appropriate.    Data Reviewed: I have personally reviewed following labs and imaging studies  CBC Lab Results  Component Value Date   WBC 9.1 03/16/2023   RBC 4.50 03/16/2023   HGB 12.6 (L) 03/16/2023   HCT 35.3 (L) 03/16/2023   MCV 78.4 (L) 03/16/2023   MCH 28.0 03/16/2023   PLT 338 03/16/2023   MCHC 35.7 03/16/2023   RDW 13.9 03/16/2023   LYMPHSABS 1.5 12/30/2021   MONOABS 0.6 12/30/2021   EOSABS 0.2 12/30/2021   BASOSABS 0.1 12/30/2021     Last metabolic panel Lab Results  Component Value Date   NA 125 (L) 03/14/2023   K 4.3 03/14/2023   CL 88 (L) 03/14/2023   CO2 21 (L) 03/14/2023   BUN 50 (H) 03/14/2023   CREATININE 6.29 (H) 03/14/2023   GLUCOSE 147 (H) 03/14/2023   GFRNONAA 10 (L) 03/14/2023   GFRAA >60 06/10/2019   CALCIUM 7.7 (L) 03/14/2023   PHOS 7.1 (H) 03/14/2023   PROT 8.1 03/11/2023   ALBUMIN 4.3 03/11/2023   BILITOT 0.7 03/11/2023   ALKPHOS  83 03/11/2023   AST 19 03/11/2023   ALT 10 03/11/2023   ANIONGAP 16 (H) 03/14/2023    GFR: Estimated Creatinine Clearance: 17.3 mL/min (A) (by C-G formula based on SCr of 6.29 mg/dL (H)).  Recent Results (from the past 240 hours)  Resp panel by RT-PCR  (RSV, Flu A&B, Covid) Anterior Nasal Swab     Status: None   Collection Time: 03/11/23 11:16 AM   Specimen: Anterior Nasal Swab  Result Value Ref Range Status   SARS Coronavirus 2 by RT PCR NEGATIVE NEGATIVE Final    Comment: (NOTE) SARS-CoV-2 target nucleic acids are NOT DETECTED.  The SARS-CoV-2 RNA is generally detectable in upper respiratory specimens during the acute phase of infection. The lowest concentration of SARS-CoV-2 viral copies this assay can detect is 138 copies/mL. A negative result does not preclude SARS-Cov-2 infection and should not be used as the sole basis for treatment or other patient management decisions. A negative result may occur with  improper specimen collection/handling, submission of specimen other than nasopharyngeal swab, presence of viral mutation(s) within the areas targeted by this assay, and inadequate number of viral copies(<138 copies/mL). A negative result must be combined with clinical observations, patient history, and epidemiological information. The expected result is Negative.  Fact Sheet for Patients:  BloggerCourse.com  Fact Sheet for Healthcare Providers:  SeriousBroker.it  This test is no t yet approved or cleared by the [place] FDA and  has been authorized for detection and/or diagnosis of SARS-CoV-2 by FDA under an Emergency Use Authorization (EUA). This EUA will remain  in effect (meaning this test can be used) for the duration of the COVID-19 declaration under Section 564(b)(1) of the Act, 21 U.S.C.section 360bbb-3(b)(1), unless the authorization is terminated  or revoked sooner.       Influenza A by PCR NEGATIVE NEGATIVE Final   Influenza B by PCR NEGATIVE NEGATIVE Final    Comment: (NOTE) The Xpert Xpress SARS-CoV-2/FLU/RSV plus assay is intended as an aid in the diagnosis of influenza from Nasopharyngeal swab specimens and should not be used as a sole basis for  treatment. Nasal washings and aspirates are unacceptable for Xpert Xpress SARS-CoV-2/FLU/RSV testing.  Fact Sheet for Patients: BloggerCourse.com  Fact Sheet for Healthcare Providers: SeriousBroker.it  This test is not yet approved or cleared by the [place] FDA and has been authorized for detection and/or diagnosis of SARS-CoV-2 by FDA under an Emergency Use Authorization (EUA). This EUA will remain in effect (meaning this test can be used) for the duration of the COVID-19 declaration under Section 564(b)(1) of the Act, 21 U.S.C. section 360bbb-3(b)(1), unless the authorization is terminated or revoked.     Resp Syncytial Virus by PCR NEGATIVE NEGATIVE Final    Comment: (NOTE) Fact Sheet for Patients: BloggerCourse.com  Fact Sheet for Healthcare Providers: SeriousBroker.it  This test is not yet approved or cleared by the [place] FDA and has been authorized for detection and/or diagnosis of SARS-CoV-2 by FDA under an Emergency Use Authorization (EUA). This EUA will remain in effect (meaning this test can be used) for the duration of the COVID-19 declaration under Section 564(b)(1) of the Act, 21 U.S.C. section 360bbb-3(b)(1), unless the authorization is terminated or revoked.  Performed at [facility], [address] [place], [place] [address]   Culture, blood (routine x 2)     Status: None   Collection Time: 03/11/23 11:50 AM   Specimen: BLOOD RIGHT ARM  Result Value Ref Range Status   Specimen Description  Final    BLOOD RIGHT ARM Performed at [facility], [address], [place], [place] [address]    Special Requests   Final    BOTTLES DRAWN AEROBIC AND ANAEROBIC Blood Culture results may not be optimal due to an excessive volume of blood received in culture bottles Performed at [facility], [address] [place], [place] [address]    Culture   Final    NO GROWTH 5 DAYS Performed at [facility], [address], [place], [place] [address]    Report Status 03/16/2023 FINAL  Final  Culture, blood (routine x 2)     Status: None   Collection Time: 03/11/23 11:50 AM   Specimen: BLOOD LEFT ARM  Result Value Ref Range Status   Specimen Description   Final    BLOOD LEFT ARM Performed at [facility], [address], [place], [place] [address]    Special Requests   Final    BOTTLES DRAWN AEROBIC AND ANAEROBIC Blood Culture results may not be optimal due to an excessive volume of blood received in culture bottles Performed at [facility], [address] [place], [place] [address]    Culture   Final    NO GROWTH 5 DAYS Performed at [facility], [address] [place], [place] [address]    Report Status 03/16/2023 FINAL  Final  MRSA Next Gen by PCR, Nasal     Status: None   Collection Time: 03/11/23  8:05 PM   Specimen: Nasal Mucosa; Nasal Swab  Result Value Ref Range Status   MRSA by PCR Next Gen NOT DETECTED NOT DETECTED Final    Comment: (NOTE) The GeneXpert MRSA Assay (FDA approved for NASAL specimens only), is one component of a comprehensive MRSA colonization surveillance program. It is not intended to diagnose MRSA infection nor to guide or monitor treatment for MRSA infections. Test performance is not FDA approved in patients less than 36 years old. Performed at [facility], [address], [place], [place] [address]   Urine Culture (for pregnant, neutropenic or urologic patients or patients with an indwelling urinary catheter)     Status: None   Collection Time: 03/11/23  8:11 PM   Specimen: Urine, Catheterized  Result Value Ref Range Status   Specimen Description URINE, CATHETERIZED  Final   Special Requests NONE  Final   Culture   Final    NO GROWTH Performed at [facility], [address], [place], [place]  [address]    Report Status 03/12/2023 FINAL  Final      Radiology Studies: US RENAL Result Date: 03/14/2023 CLINICAL DATA:  Acute kidney injury EXAM: RENAL / URINARY TRACT ULTRASOUND COMPLETE COMPARISON:  CT 03/11/2023 and older.  Renal ultrasound 2019 June. FINDINGS: Right Kidney: Renal measurements: 13.6 x 6.3 x 6.2 cm = volume: 278 mL. Echogenicity within normal limits. No mass or hydronephrosis visualized. Left Kidney: Renal measurements: 13.1 x 6.5 x 6.1 cm = volume: 271 mL. Echogenicity within normal limits. No mass or hydronephrosis visualized. Bladder: Appears normal for degree of bladder distention. Other: None. IMPRESSION: No collecting system dilatation. Electronically Signed   By: Karen Kays M.D.   On: 03/14/2023 17:49      LOS: 5 days    Jacquelin Hawking, MD [facility] 03/16/2023, 10:40 AM   If 7PM-7AM, please contact night-coverage [email or web address]

## 2023-03-16 NOTE — Plan of Care (Signed)

## 2023-03-17 DIAGNOSIS — N179 Acute kidney failure, unspecified: Secondary | ICD-10-CM | POA: Diagnosis not present

## 2023-03-17 LAB — RENAL FUNCTION PANEL
Albumin: 2.5 g/dL — ABNORMAL LOW (ref 3.5–5.0)
Anion gap: 18 — ABNORMAL HIGH (ref 5–15)
BUN: 48 mg/dL — ABNORMAL HIGH (ref 6–20)
CO2: 20 mmol/L — ABNORMAL LOW (ref 22–32)
Calcium: 8.1 mg/dL — ABNORMAL LOW (ref 8.9–10.3)
Chloride: 98 mmol/L (ref 98–111)
Creatinine, Ser: 6.22 mg/dL — ABNORMAL HIGH (ref 0.61–1.24)
GFR, Estimated: 10 mL/min — ABNORMAL LOW (ref >60)
Glucose, Bld: 182 mg/dL — ABNORMAL HIGH (ref 70–99)
Phosphorus: 6.9 mg/dL — ABNORMAL HIGH (ref 2.5–4.6)
Potassium: 3.7 mmol/L (ref 3.5–5.1)
Sodium: 136 mmol/L (ref 135–145)

## 2023-03-17 LAB — GLUCOSE, CAPILLARY
Glucose-Capillary: 167 mg/dL — ABNORMAL HIGH (ref 70–99)
Glucose-Capillary: 153 mg/dL — ABNORMAL HIGH (ref 70–99)
Glucose-Capillary: 273 mg/dL — ABNORMAL HIGH (ref 70–99)
Glucose-Capillary: 173 mg/dL — ABNORMAL HIGH (ref 70–99)

## 2023-03-17 NOTE — Progress Notes (Signed)
 PROGRESS NOTE    Bryan [name]  QIO:[id number] DOB: [birth year] DOA: 03/11/2023 PCP: Mattie Marlin, DO   Brief Narrative: Bryan [name] is a 53 y.o. male with a history of diabetes, hypertension, hyperlipidemia.  Patient presented secondary to fatigue, nausea and vomiting and found to have AKI with associated hypotension and shock, requiring vasopressor support and ICU admission. Vasopressors weaned off. AKI persists.   Assessment and Plan:  Undifferentiated shock Possible hypovolemic vs septic. V/Q scan was negative for PE. Patient was managed in the ICU and treated with IV fluids and Levophed. Vasopressors weaned off on 2/23 and patient transferred out of the ICU on 2/25. Patient was treated empirically with Vancomycin/Cefepime/Flagyl and transitioned to Zosyn with recommendation for 7 days of treatment. Will hold Zosyn secondary to concern for possible AIN.  AKI Baseline of about 1.2-1.5. Creatinine of 5.39 on admission. Nephrology consulted. Concern for possible ATN from shock. Creatinine peak of 6.75. Biopsy performed 2/28. -Nephrology recommendations: supportive care, follow-up biopsy results  Hyponatremia Secondary to AKI.  Nephrology on board. Sodium normalized.  Right ventricular reduced function Patient noted to have mildly reduced RV systolic function. No PE on V/Q scan.  Diabetes mellitus type 2 Uncontrolled with hyperglycemia and hemoglobin A1C >15.5% -Continue SSI -Continue insulin glargine 10 units  Primary hypertension Patient is on amlodipine, Coreg and lisinopril as an outpatient which were held secondary to shock.  Hyperlipidemia -Continue Crestor   DVT prophylaxis: Heparin Code Status:   Code Status: Full Code Family Communication: None at bedside Disposition Plan: Discharge pending improvement in AKI and continued nephrology recommendations/management   Consultants:  PCCM Nephrology  Procedures:  Transthoracic  Echocardiogram  Antimicrobials: Vancomycin Zosyn Cefepime Flagyl    Subjective: No issues this morning. Hoping to get something to eat.  Objective: BP 131/85 (BP Location: Left Arm)   Pulse 100   Temp 98.4 F (36.9 C) (Oral)   Resp 18   Ht 5\' 10"  (1.778 m)   Wt 116.8 kg   SpO2 99%   BMI 36.95 kg/m   Examination:  General exam: Appears calm and comfortable Respiratory system: Respiratory effort normal. Central nervous system: Alert and oriented. Psychiatry: Judgement and insight appear normal. Mood & affect appropriate.    Data Reviewed: I have personally reviewed following labs and imaging studies  CBC Lab Results  Component Value Date   WBC 9.1 03/16/2023   RBC 4.50 03/16/2023   HGB 12.6 (L) 03/16/2023   HCT 35.3 (L) 03/16/2023   MCV 78.4 (L) 03/16/2023   MCH 28.0 03/16/2023   PLT 338 03/16/2023   MCHC 35.7 03/16/2023   RDW 13.9 03/16/2023   LYMPHSABS 1.5 12/30/2021   MONOABS 0.6 12/30/2021   EOSABS 0.2 12/30/2021   BASOSABS 0.1 12/30/2021     Last metabolic panel Lab Results  Component Value Date   NA 136 03/17/2023   K 3.7 03/17/2023   CL 98 03/17/2023   CO2 20 (L) 03/17/2023   BUN 48 (H) 03/17/2023   CREATININE 6.22 (H) 03/17/2023   GLUCOSE 182 (H) 03/17/2023   GFRNONAA 10 (L) 03/17/2023   GFRAA >60 06/10/2019   CALCIUM 8.1 (L) 03/17/2023   PHOS 6.9 (H) 03/17/2023   PROT 8.1 03/11/2023   ALBUMIN 2.5 (L) 03/17/2023   BILITOT 0.7 03/11/2023   ALKPHOS 83 03/11/2023   AST 19 03/11/2023   ALT 10 03/11/2023   ANIONGAP 18 (H) 03/17/2023    GFR: Estimated Creatinine Clearance: 17.2 mL/min (A) (by C-G formula based on SCr  of 6.22 mg/dL (H)).  Recent Results (from the past 240 hours)  Resp panel by RT-PCR (RSV, Flu A&B, Covid) Anterior Nasal Swab     Status: None   Collection Time: 03/11/23 11:16 AM   Specimen: Anterior Nasal Swab  Result Value Ref Range Status   SARS Coronavirus 2 by RT PCR NEGATIVE NEGATIVE Final    Comment:  (NOTE) SARS-CoV-2 target nucleic acids are NOT DETECTED.  The SARS-CoV-2 RNA is generally detectable in upper respiratory specimens during the acute phase of infection. The lowest concentration of SARS-CoV-2 viral copies this assay can detect is 138 copies/mL. A negative result does not preclude SARS-Cov-2 infection and should not be used as the sole basis for treatment or other patient management decisions. A negative result may occur with  improper specimen collection/handling, submission of specimen other than nasopharyngeal swab, presence of viral mutation(s) within the areas targeted by this assay, and inadequate number of viral copies(<138 copies/mL). A negative result must be combined with clinical observations, patient history, and epidemiological information. The expected result is Negative.  Fact Sheet for Patients:  BloggerCourse.com  Fact Sheet for Healthcare Providers:  SeriousBroker.it  This test is no t yet approved or cleared by the [place] FDA and  has been authorized for detection and/or diagnosis of SARS-CoV-2 by FDA under an Emergency Use Authorization (EUA). This EUA will remain  in effect (meaning this test can be used) for the duration of the COVID-19 declaration under Section 564(b)(1) of the Act, 21 U.S.C.section 360bbb-3(b)(1), unless the authorization is terminated  or revoked sooner.       Influenza A by PCR NEGATIVE NEGATIVE Final   Influenza B by PCR NEGATIVE NEGATIVE Final    Comment: (NOTE) The Xpert Xpress SARS-CoV-2/FLU/RSV plus assay is intended as an aid in the diagnosis of influenza from Nasopharyngeal swab specimens and should not be used as a sole basis for treatment. Nasal washings and aspirates are unacceptable for Xpert Xpress SARS-CoV-2/FLU/RSV testing.  Fact Sheet for Patients: BloggerCourse.com  Fact Sheet for Healthcare  Providers: SeriousBroker.it  This test is not yet approved or cleared by the [place] FDA and has been authorized for detection and/or diagnosis of SARS-CoV-2 by FDA under an Emergency Use Authorization (EUA). This EUA will remain in effect (meaning this test can be used) for the duration of the COVID-19 declaration under Section 564(b)(1) of the Act, 21 U.S.C. section 360bbb-3(b)(1), unless the authorization is terminated or revoked.     Resp Syncytial Virus by PCR NEGATIVE NEGATIVE Final    Comment: (NOTE) Fact Sheet for Patients: BloggerCourse.com  Fact Sheet for Healthcare Providers: SeriousBroker.it  This test is not yet approved or cleared by the [place] FDA and has been authorized for detection and/or diagnosis of SARS-CoV-2 by FDA under an Emergency Use Authorization (EUA). This EUA will remain in effect (meaning this test can be used) for the duration of the COVID-19 declaration under Section 564(b)(1) of the Act, 21 U.S.C. section 360bbb-3(b)(1), unless the authorization is terminated or revoked.  Performed at [facility], [address], [place], [place] [address]   Culture, blood (routine x 2)     Status: None   Collection Time: 03/11/23 11:50 AM   Specimen: BLOOD RIGHT ARM  Result Value Ref Range Status   Specimen Description   Final    BLOOD RIGHT ARM Performed at [facility], [address], [place], [place] [address]    Special Requests   Final    BOTTLES  DRAWN AEROBIC AND ANAEROBIC Blood Culture results may not be optimal due to an excessive volume of blood received in culture bottles Performed at [facility], [address] [place], [place] [address]    Culture   Final    NO GROWTH 5 DAYS Performed at [facility], [address], [place], [place] [address]    Report Status 03/16/2023 FINAL  Final  Culture, blood  (routine x 2)     Status: None   Collection Time: 03/11/23 11:50 AM   Specimen: BLOOD LEFT ARM  Result Value Ref Range Status   Specimen Description   Final    BLOOD LEFT ARM Performed at [facility], [address] [place], [place] [address]    Special Requests   Final    BOTTLES DRAWN AEROBIC AND ANAEROBIC Blood Culture results may not be optimal due to an excessive volume of blood received in culture bottles Performed at [facility], [address], [place], [place] [address]    Culture   Final    NO GROWTH 5 DAYS Performed at [facility], [address] [place], [place] [address]    Report Status 03/16/2023 FINAL  Final  MRSA Next Gen by PCR, Nasal     Status: None   Collection Time: 03/11/23  8:05 PM   Specimen: Nasal Mucosa; Nasal Swab  Result Value Ref Range Status   MRSA by PCR Next Gen NOT DETECTED NOT DETECTED Final    Comment: (NOTE) The GeneXpert MRSA Assay (FDA approved for NASAL specimens only), is one component of a comprehensive MRSA colonization surveillance program. It is not intended to diagnose MRSA infection nor to guide or monitor treatment for MRSA infections. Test performance is not FDA approved in patients less than 71 years old. Performed at [facility], [address], [place], [place] [address]   Urine Culture (for pregnant, neutropenic or urologic patients or patients with an indwelling urinary catheter)     Status: None   Collection Time: 03/11/23  8:11 PM   Specimen: Urine, Catheterized  Result Value Ref Range Status   Specimen Description URINE, CATHETERIZED  Final   Special Requests NONE  Final   Culture   Final    NO GROWTH Performed at [facility], [address] [place], [place] [address]    Report Status 03/12/2023 FINAL  Final      Radiology Studies: US BIOPSY (KIDNEY) Result Date: 03/16/2023 INDICATION: 1 year old with acute kidney injury.  Request for a renal biopsy. EXAM:  ULTRASOUND-GUIDED RANDOM RENAL BIOPSY MEDICATIONS: Moderate sedation ANESTHESIA/SEDATION: Moderate (conscious) sedation was employed during this procedure. A total of Versed 2 mg and Fentanyl 100 mcg was administered intravenously by the radiology nurse. Total intra-service moderate Sedation Time: 22 minutes. The patient's level of consciousness and vital signs were monitored continuously by radiology nursing throughout the procedure under my direct supervision. FLUOROSCOPY TIME:  None COMPLICATIONS: None immediate. PROCEDURE: Informed written consent was obtained from the patient after a thorough discussion of the procedural risks, benefits and alternatives. All questions were addressed. A timeout was performed prior to the initiation of the procedure. Patient was placed prone. Both kidneys were evaluated with ultrasound. Left kidney was selected for biopsy. Left flank was prepped and draped in sterile fashion. Maximal barrier sterile technique was utilized including caps, mask, sterile gowns, sterile gloves, sterile drape, hand hygiene and skin antiseptic. Skin was anesthetized using 1% lidocaine. Small incision was made. Using  ultrasound guidance, a 17 gauge coaxial needle was directed to the lower pole cortex. Two core biopsies were obtained from the lower pole cortex with an 18 gauge core device. Specimens placed in saline. Gel-Foam slurry was injected as the 17 gauge coaxial needle was retracted. Bandage placed over the puncture site. FINDINGS: 2 adequate core biopsy specimens were obtained. No immediate bleeding or hematoma formation. IMPRESSION: Successful ultrasound-guided left renal biopsy. Electronically Signed   By: Richarda Overlie M.D.   On: 03/16/2023 13:49      LOS: 6 days    Jacquelin Hawking, MD [facility] 03/17/2023, 10:52 AM   If 7PM-7AM, please contact night-coverage [email or web address]

## 2023-03-17 NOTE — Plan of Care (Signed)

## 2023-03-17 NOTE — Progress Notes (Signed)
 [facility] Progress Note    Assessment/ Plan:   AKI -bl Cr around 1. Up to 6.29 today. Nonoliguric -suspecting AKI is secondary to ATN from shock. No obstruction on initial CT.  -Cr down to 6.22, nonoliguric. renal US without obstruction. Hoping that is going into the plateau/recovery phase -urine sediment with hgb positive on dip but no signficant RBCs/HPF. CK WNL. Also with WBC's/HPF but few bacteria. -fluids: Hold for now, encourage p.o. hydration in the interim -Proceeded with renal biopsy 2/27 given concerns for AIN.  Is off Zosyn now. Pending path -No indication for renal replacement therapy as of today -GN serologies neg -Avoid nephrotoxic medications including NSAIDs and iodinated intravenous contrast exposure unless the latter is absolutely indicated.  Preferred narcotic agents for pain control are hydromorphone, fentanyl, and methadone. Morphine should not be used. Avoid Baclofen and avoid oral sodium phosphate and magnesium citrate based laxatives / bowel preps. Continue strict Input and Output monitoring. Will monitor the patient closely with you and intervene or adjust therapy as indicated by changes in clinical status/labs    Hyponatremia -asymptomatic -suspecting this is related AKI -fluid restrict for now: 1.2L/day -resolved   Shock -etiology unclear -off pressors -per primary -cont to hold home anti-HTNs   Acidosis -mild, monitor for now   Anemia -Transfuse for Hgb<7 g/dL -hgb stable/acceptable   Uncontrolled Diabetes Mellitus Type 2 with Hyperglycemia -per primary   Subjective:   Patient seen and examined bedside. No complaints. He reports that he continues to pee a lot. No complaints.   Objective:   BP 131/85 (BP Location: Left Arm)   Pulse 100   Temp 98.4 F (36.9 C) (Oral)   Resp 18   Ht 5\' 10"  (1.778 m)   Wt 116.8 kg   SpO2 99%   BMI 36.95 kg/m   Intake/Output Summary (Last 24 hours) at 03/17/2023 1158 Last data filed at  03/17/2023 0900 Gross per 24 hour  Intake 840 ml  Output 600 ml  Net 240 ml   Weight change: -0.2 kg  Physical Exam: Gen: NAD, laying flat in bed CVS: RRR Resp: Normal work of breathing, unlabored Abd: soft, nt/nd, obese Ext: no sig edema Neuro: awake, alert, no myoclonic jerking observed  Imaging: US BIOPSY (KIDNEY) Result Date: 03/16/2023 INDICATION: 53 year old with acute kidney injury.  Request for a renal biopsy. EXAM: ULTRASOUND-GUIDED RANDOM RENAL BIOPSY MEDICATIONS: Moderate sedation ANESTHESIA/SEDATION: Moderate (conscious) sedation was employed during this procedure. A total of Versed 2 mg and Fentanyl 100 mcg was administered intravenously by the radiology nurse. Total intra-service moderate Sedation Time: 22 minutes. The patient's level of consciousness and vital signs were monitored continuously by radiology nursing throughout the procedure under my direct supervision. FLUOROSCOPY TIME:  None COMPLICATIONS: None immediate. PROCEDURE: Informed written consent was obtained from the patient after a thorough discussion of the procedural risks, benefits and alternatives. All questions were addressed. A timeout was performed prior to the initiation of the procedure. Patient was placed prone. Both kidneys were evaluated with ultrasound. Left kidney was selected for biopsy. Left flank was prepped and draped in sterile fashion. Maximal barrier sterile technique was utilized including caps, mask, sterile gowns, sterile gloves, sterile drape, hand hygiene and skin antiseptic. Skin was anesthetized using 1% lidocaine. Small incision was made. Using ultrasound guidance, a 17 gauge coaxial needle was directed to the lower pole cortex. Two core biopsies were obtained from the lower pole cortex with an 18 gauge core device. Specimens placed in saline. Gel-Foam slurry was injected  as the 17 gauge coaxial needle was retracted. Bandage placed over the puncture site. FINDINGS: 2 adequate core biopsy  specimens were obtained. No immediate bleeding or hematoma formation. IMPRESSION: Successful ultrasound-guided left renal biopsy. Electronically Signed   By: Richarda Overlie M.D.   On: 03/16/2023 13:49    Labs: BMET Recent Labs  Lab 03/11/23 2119 03/12/23 1024 03/13/23 0222 03/14/23 0419 03/14/23 2322 03/16/23 1028 03/17/23 0522  NA 124* 124* 124* 122* 125* 130* 136  K 3.5 4.2 3.6 4.0 4.3 4.4 3.7  CL 88* 90* 91* 91* 88* 95* 98  CO2 19* 22 19* 19* 21* 21* 20*  GLUCOSE 316* 191* 157* 198* 147* 147* 182*  BUN 40* 40* 43* 47* 50* 48* 48*  CREATININE 4.64* 4.26* 4.46* 5.52* 6.29* 6.75* 6.22*  CALCIUM 7.7* 7.8* 7.9* 7.6* 7.7* 8.1* 8.1*  PHOS 5.7* 5.2* 5.8* 7.1*  --  7.9* 6.9*   CBC Recent Labs  Lab 03/12/23 1024 03/13/23 0222 03/14/23 0419 03/16/23 0620  WBC 10.3 8.8 7.8 9.1  HGB 11.1* 11.1* 11.0* 12.6*  HCT 30.9* 32.4* 31.3* 35.3*  MCV 82.0 78.3* 77.1* 78.4*  PLT 160 175 187 338    Medications:     Chlorhexidine Gluconate Cloth  6 each Topical Daily   heparin  5,000 Units Subcutaneous Q8H   insulin aspart  0-15 Units Subcutaneous TID WC   insulin aspart  0-5 Units Subcutaneous QHS   insulin glargine  10 Units Subcutaneous QHS   living well with diabetes book   Does not apply Once   rosuvastatin  10 mg Oral Daily      Anthony Sar, MD [facility] 03/17/2023, 11:58 AM

## 2023-03-18 DIAGNOSIS — N179 Acute kidney failure, unspecified: Secondary | ICD-10-CM | POA: Diagnosis not present

## 2023-03-18 LAB — RENAL FUNCTION PANEL
Albumin: 2.6 g/dL — ABNORMAL LOW (ref 3.5–5.0)
Anion gap: 12 (ref 5–15)
BUN: 44 mg/dL — ABNORMAL HIGH (ref 6–20)
CO2: 24 mmol/L (ref 22–32)
Calcium: 8.1 mg/dL — ABNORMAL LOW (ref 8.9–10.3)
Chloride: 99 mmol/L (ref 98–111)
Creatinine, Ser: 5.05 mg/dL — ABNORMAL HIGH (ref 0.61–1.24)
GFR, Estimated: 13 mL/min — ABNORMAL LOW (ref >60)
Glucose, Bld: 164 mg/dL — ABNORMAL HIGH (ref 70–99)
Phosphorus: 4.9 mg/dL — ABNORMAL HIGH (ref 2.5–4.6)
Potassium: 3.8 mmol/L (ref 3.5–5.1)
Sodium: 135 mmol/L (ref 135–145)

## 2023-03-18 LAB — GLUCOSE, CAPILLARY
Glucose-Capillary: 192 mg/dL — ABNORMAL HIGH (ref 70–99)
Glucose-Capillary: 151 mg/dL — ABNORMAL HIGH (ref 70–99)
Glucose-Capillary: 165 mg/dL — ABNORMAL HIGH (ref 70–99)
Glucose-Capillary: 254 mg/dL — ABNORMAL HIGH (ref 70–99)

## 2023-03-18 NOTE — Progress Notes (Signed)
 [facility] Progress Note    Assessment/ Plan:   AKI -bl Cr around 1. Up to 6.29 today. Nonoliguric -suspecting AKI is secondary to ATN from shock. No obstruction on initial CT.  -Cr down to 5, nonoliguric. renal US without obstruction. In recovery phase -urine sediment with hgb positive on dip but no signficant RBCs/HPF. CK WNL. Also with WBC's/HPF but few bacteria. -fluids: not indicated -Proceeded with renal biopsy 2/27 given concerns for AIN.  Is off Zosyn now. Pending path. Likely going to end up seeing acute tubular injury as main driver of AKI -No indication for renal replacement therapy as of today -GN serologies neg -Avoid nephrotoxic medications including NSAIDs and iodinated intravenous contrast exposure unless the latter is absolutely indicated.  Preferred narcotic agents for pain control are hydromorphone, fentanyl, and methadone. Morphine should not be used. Avoid Baclofen and avoid oral sodium phosphate and magnesium citrate based laxatives / bowel preps. Continue strict Input and Output monitoring. Will monitor the patient closely with you and intervene or adjust therapy as indicated by changes in clinical status/labs    Hyponatremia -asymptomatic -suspecting this was related AKI -resolved   Shock H/o HTN -etiology unclear -off pressors -per primary -improved   Acidosis -mild, monitor for now, resolved   Anemia -Transfuse for Hgb<7 g/dL -last hgb stable/acceptable   Uncontrolled Diabetes Mellitus Type 2 with Hyperglycemia -per primary   Subjective:   Patient seen and examined bedside. No complaints. Feels well. Uop charted ~2.6L   Objective:   BP (!) 141/98 (BP Location: Left Arm)   Pulse 87   Temp 98.6 F (37 C) (Oral)   Resp 18   Ht 5\' 10"  (1.778 m)   Wt 112.1 kg   SpO2 100%   BMI 35.47 kg/m   Intake/Output Summary (Last 24 hours) at 03/18/2023 1315 Last data filed at 03/18/2023 1313 Gross per 24 hour  Intake 1660 ml  Output  3200 ml  Net -1540 ml   Weight change: -4.381 kg  Physical Exam: Gen: NAD, laying flat in bed CVS: RRR Resp: Normal work of breathing, unlabored Abd: soft, nt/nd, obese Ext: no sig edema Neuro: awake, alert, no myoclonic jerking observed  Imaging: No results found.   Labs: BMET Recent Labs  Lab 03/11/23 2119 03/12/23 1024 03/13/23 0222 03/14/23 0419 03/14/23 2322 03/16/23 1028 03/17/23 0522 03/18/23 0836  NA 124* 124* 124* 122* 125* 130* 136 135  K 3.5 4.2 3.6 4.0 4.3 4.4 3.7 3.8  CL 88* 90* 91* 91* 88* 95* 98 99  CO2 19* 22 19* 19* 21* 21* 20* 24  GLUCOSE 316* 191* 157* 198* 147* 147* 182* 164*  BUN 40* 40* 43* 47* 50* 48* 48* 44*  CREATININE 4.64* 4.26* 4.46* 5.52* 6.29* 6.75* 6.22* 5.05*  CALCIUM 7.7* 7.8* 7.9* 7.6* 7.7* 8.1* 8.1* 8.1*  PHOS 5.7* 5.2* 5.8* 7.1*  --  7.9* 6.9* 4.9*   CBC Recent Labs  Lab 03/12/23 1024 03/13/23 0222 03/14/23 0419 03/16/23 0620  WBC 10.3 8.8 7.8 9.1  HGB 11.1* 11.1* 11.0* 12.6*  HCT 30.9* 32.4* 31.3* 35.3*  MCV 82.0 78.3* 77.1* 78.4*  PLT 160 175 187 338    Medications:     Chlorhexidine Gluconate Cloth  6 each Topical Daily   heparin  5,000 Units Subcutaneous Q8H   insulin aspart  0-15 Units Subcutaneous TID WC   insulin aspart  0-5 Units Subcutaneous QHS   insulin glargine  10 Units Subcutaneous QHS   living well with diabetes book  Does not apply Once   rosuvastatin  10 mg Oral Daily      Anthony Sar, MD [facility] 03/18/2023, 1:15 PM

## 2023-03-18 NOTE — Progress Notes (Signed)
 PROGRESS NOTE    Bryan [name]  UJW:[id number] DOB: Jul 09, 1970 DOA: 03/11/2023 PCP: Mattie Marlin, DO   Brief Narrative: Bryan [name] is a 53 y.o. male with a history of diabetes, hypertension, hyperlipidemia.  Patient presented secondary to fatigue, nausea and vomiting and found to have AKI with associated hypotension and shock, requiring vasopressor support and ICU admission. Vasopressors weaned off. AKI persists.   Assessment and Plan:  Undifferentiated shock Possible hypovolemic vs septic. V/Q scan was negative for PE. Patient was managed in the ICU and treated with IV fluids and Levophed. Vasopressors weaned off on 2/23 and patient transferred out of the ICU on 2/25. Patient was treated empirically with Vancomycin/Cefepime/Flagyl and transitioned to Zosyn with recommendation for 7 days of treatment. Will hold Zosyn secondary to concern for possible AIN.  AKI Baseline of about 1.2-1.5. Creatinine of 5.39 on admission. Nephrology consulted. Concern for possible ATN from shock. Creatinine peak of 6.75. Biopsy performed 2/28. Creatinine pending today. -Nephrology recommendations: supportive care, follow-up biopsy results  Hyponatremia Secondary to AKI.  Nephrology on board. Sodium normalized.  Right ventricular reduced function Patient noted to have mildly reduced RV systolic function. No PE on V/Q scan.  Diabetes mellitus type 2 Uncontrolled with hyperglycemia and hemoglobin A1C >15.5% -Continue SSI -Continue insulin glargine 10 units  Primary hypertension Patient is on amlodipine, Coreg and lisinopril as an outpatient which were held secondary to shock.  Hyperlipidemia -Continue Crestor   DVT prophylaxis: Heparin Code Status:   Code Status: Full Code Family Communication: None at bedside Disposition Plan: Discharge pending improvement in AKI and continued nephrology recommendations/management   Consultants:  PCCM Nephrology  Procedures:   Transthoracic Echocardiogram  Antimicrobials: Vancomycin Zosyn Cefepime Flagyl    Subjective: No concerns this morning.  Objective: BP (!) 141/98 (BP Location: Left Arm)   Pulse 87   Temp 98.6 F (37 C) (Oral)   Resp 18   Ht 5\' 10"  (1.778 m)   Wt 112.1 kg   SpO2 100%   BMI 35.47 kg/m   Examination:  General exam: Appears calm and comfortable Respiratory system: Respiratory effort normal. Central nervous system: Alert and oriented. Psychiatry: Judgement and insight appear normal. Mood & affect appropriate.    Data Reviewed: I have personally reviewed following labs and imaging studies  CBC Lab Results  Component Value Date   WBC 9.1 03/16/2023   RBC 4.50 03/16/2023   HGB 12.6 (L) 03/16/2023   HCT 35.3 (L) 03/16/2023   MCV 78.4 (L) 03/16/2023   MCH 28.0 03/16/2023   PLT 338 03/16/2023   MCHC 35.7 03/16/2023   RDW 13.9 03/16/2023   LYMPHSABS 1.5 12/30/2021   MONOABS 0.6 12/30/2021   EOSABS 0.2 12/30/2021   BASOSABS 0.1 12/30/2021     Last metabolic panel Lab Results  Component Value Date   NA 136 03/17/2023   K 3.7 03/17/2023   CL 98 03/17/2023   CO2 20 (L) 03/17/2023   BUN 48 (H) 03/17/2023   CREATININE 6.22 (H) 03/17/2023   GLUCOSE 182 (H) 03/17/2023   GFRNONAA 10 (L) 03/17/2023   GFRAA >60 06/10/2019   CALCIUM 8.1 (L) 03/17/2023   PHOS 6.9 (H) 03/17/2023   PROT 8.1 03/11/2023   ALBUMIN 2.5 (L) 03/17/2023   BILITOT 0.7 03/11/2023   ALKPHOS 83 03/11/2023   AST 19 03/11/2023   ALT 10 03/11/2023   ANIONGAP 18 (H) 03/17/2023    GFR: Estimated Creatinine Clearance: 16.8 mL/min (A) (by C-G formula based on SCr of 6.22  mg/dL (H)).  Recent Results (from the past 240 hours)  Resp panel by RT-PCR (RSV, Flu A&B, Covid) Anterior Nasal Swab     Status: None   Collection Time: 03/11/23 11:16 AM   Specimen: Anterior Nasal Swab  Result Value Ref Range Status   SARS Coronavirus 2 by RT PCR NEGATIVE NEGATIVE Final    Comment: (NOTE) SARS-CoV-2  target nucleic acids are NOT DETECTED.  The SARS-CoV-2 RNA is generally detectable in upper respiratory specimens during the acute phase of infection. The lowest concentration of SARS-CoV-2 viral copies this assay can detect is 138 copies/mL. A negative result does not preclude SARS-Cov-2 infection and should not be used as the sole basis for treatment or other patient management decisions. A negative result may occur with  improper specimen collection/handling, submission of specimen other than nasopharyngeal swab, presence of viral mutation(s) within the areas targeted by this assay, and inadequate number of viral copies(<138 copies/mL). A negative result must be combined with clinical observations, patient history, and epidemiological information. The expected result is Negative.  Fact Sheet for Patients:  BloggerCourse.com  Fact Sheet for Healthcare Providers:  SeriousBroker.it  This test is no t yet approved or cleared by the [place] FDA and  has been authorized for detection and/or diagnosis of SARS-CoV-2 by FDA under an Emergency Use Authorization (EUA). This EUA will remain  in effect (meaning this test can be used) for the duration of the COVID-19 declaration under Section 564(b)(1) of the Act, 21 U.S.C.section 360bbb-3(b)(1), unless the authorization is terminated  or revoked sooner.       Influenza A by PCR NEGATIVE NEGATIVE Final   Influenza B by PCR NEGATIVE NEGATIVE Final    Comment: (NOTE) The Xpert Xpress SARS-CoV-2/FLU/RSV plus assay is intended as an aid in the diagnosis of influenza from Nasopharyngeal swab specimens and should not be used as a sole basis for treatment. Nasal washings and aspirates are unacceptable for Xpert Xpress SARS-CoV-2/FLU/RSV testing.  Fact Sheet for Patients: BloggerCourse.com  Fact Sheet for Healthcare  Providers: SeriousBroker.it  This test is not yet approved or cleared by the [place] FDA and has been authorized for detection and/or diagnosis of SARS-CoV-2 by FDA under an Emergency Use Authorization (EUA). This EUA will remain in effect (meaning this test can be used) for the duration of the COVID-19 declaration under Section 564(b)(1) of the Act, 21 U.S.C. section 360bbb-3(b)(1), unless the authorization is terminated or revoked.     Resp Syncytial Virus by PCR NEGATIVE NEGATIVE Final    Comment: (NOTE) Fact Sheet for Patients: BloggerCourse.com  Fact Sheet for Healthcare Providers: SeriousBroker.it  This test is not yet approved or cleared by the [place] FDA and has been authorized for detection and/or diagnosis of SARS-CoV-2 by FDA under an Emergency Use Authorization (EUA). This EUA will remain in effect (meaning this test can be used) for the duration of the COVID-19 declaration under Section 564(b)(1) of the Act, 21 U.S.C. section 360bbb-3(b)(1), unless the authorization is terminated or revoked.  Performed at [facility], [address] [place], [place] [address]   Culture, blood (routine x 2)     Status: None   Collection Time: 03/11/23 11:50 AM   Specimen: BLOOD RIGHT ARM  Result Value Ref Range Status   Specimen Description   Final    BLOOD RIGHT ARM Performed at [facility], [address] [place], [place] [address]    Special Requests   Final    BOTTLES DRAWN AEROBIC  AND ANAEROBIC Blood Culture results may not be optimal due to an excessive volume of blood received in culture bottles Performed at [facility], [address] [place], [place] [address]    Culture   Final    NO GROWTH 5 DAYS Performed at [facility], [address], [place], [place] [address]    Report Status 03/16/2023 FINAL  Final  Culture, blood  (routine x 2)     Status: None   Collection Time: 03/11/23 11:50 AM   Specimen: BLOOD LEFT ARM  Result Value Ref Range Status   Specimen Description   Final    BLOOD LEFT ARM Performed at [facility], [address], [place], [place] [address]    Special Requests   Final    BOTTLES DRAWN AEROBIC AND ANAEROBIC Blood Culture results may not be optimal due to an excessive volume of blood received in culture bottles Performed at [facility], [address], [place], [place] [address]    Culture   Final    NO GROWTH 5 DAYS Performed at [facility], [address], [place], [place] [address]    Report Status 03/16/2023 FINAL  Final  MRSA Next Gen by PCR, Nasal     Status: None   Collection Time: 03/11/23  8:05 PM   Specimen: Nasal Mucosa; Nasal Swab  Result Value Ref Range Status   MRSA by PCR Next Gen NOT DETECTED NOT DETECTED Final    Comment: (NOTE) The GeneXpert MRSA Assay (FDA approved for NASAL specimens only), is one component of a comprehensive MRSA colonization surveillance program. It is not intended to diagnose MRSA infection nor to guide or monitor treatment for MRSA infections. Test performance is not FDA approved in patients less than 11 years old. Performed at [facility], [address], [place], [place] [address]   Urine Culture (for pregnant, neutropenic or urologic patients or patients with an indwelling urinary catheter)     Status: None   Collection Time: 03/11/23  8:11 PM   Specimen: Urine, Catheterized  Result Value Ref Range Status   Specimen Description URINE, CATHETERIZED  Final   Special Requests NONE  Final   Culture   Final    NO GROWTH Performed at [facility], [address] [place], [place] [address]    Report Status 03/12/2023 FINAL  Final      Radiology Studies: US BIOPSY (KIDNEY) Result Date: 03/16/2023 INDICATION: 18 year old with acute kidney injury.  Request for a renal biopsy. EXAM:  ULTRASOUND-GUIDED RANDOM RENAL BIOPSY MEDICATIONS: Moderate sedation ANESTHESIA/SEDATION: Moderate (conscious) sedation was employed during this procedure. A total of Versed 2 mg and Fentanyl 100 mcg was administered intravenously by the radiology nurse. Total intra-service moderate Sedation Time: 22 minutes. The patient's level of consciousness and vital signs were monitored continuously by radiology nursing throughout the procedure under my direct supervision. FLUOROSCOPY TIME:  None COMPLICATIONS: None immediate. PROCEDURE: Informed written consent was obtained from the patient after a thorough discussion of the procedural risks, benefits and alternatives. All questions were addressed. A timeout was performed prior to the initiation of the procedure. Patient was placed prone. Both kidneys were evaluated with ultrasound. Left kidney was selected for biopsy. Left flank was prepped and draped in sterile fashion. Maximal barrier sterile technique was utilized including caps, mask, sterile gowns, sterile gloves, sterile drape, hand hygiene and skin antiseptic. Skin was anesthetized using 1% lidocaine. Small incision was made. Using ultrasound guidance,  a 17 gauge coaxial needle was directed to the lower pole cortex. Two core biopsies were obtained from the lower pole cortex with an 18 gauge core device. Specimens placed in saline. Gel-Foam slurry was injected as the 17 gauge coaxial needle was retracted. Bandage placed over the puncture site. FINDINGS: 2 adequate core biopsy specimens were obtained. No immediate bleeding or hematoma formation. IMPRESSION: Successful ultrasound-guided left renal biopsy. Electronically Signed   By: Richarda Overlie M.D.   On: 03/16/2023 13:49      LOS: 7 days    Jacquelin Hawking, MD [facility] 03/18/2023, 9:21 AM   If 7PM-7AM, please contact night-coverage [email or web address]

## 2023-03-18 NOTE — Plan of Care (Signed)
  Problem: Health Behavior/Discharge Planning: Goal: Ability to manage health-related needs will improve Outcome: Progressing   Problem: Clinical Measurements: Goal: Will remain free from infection Outcome: Progressing Goal: Diagnostic test results will improve Outcome: Progressing Goal: Respiratory complications will improve Outcome: Progressing Goal: Cardiovascular complication will be avoided Outcome: Progressing   Problem: Activity: Goal: Risk for activity intolerance will decrease Outcome: Progressing   Problem: Nutrition: Goal: Adequate nutrition will be maintained Outcome: Progressing   Problem: Coping: Goal: Level of anxiety will decrease Outcome: Progressing   Problem: Elimination: Goal: Will not experience complications related to bowel motility Outcome: Progressing Goal: Will not experience complications related to urinary retention Outcome: Progressing   Problem: Pain Managment: Goal: General experience of comfort will improve and/or be controlled Outcome: Progressing   Problem: Safety: Goal: Ability to remain free from injury will improve Outcome: Progressing   Problem: Skin Integrity: Goal: Risk for impaired skin integrity will decrease Outcome: Progressing   Problem: Education: Goal: Ability to describe self-care measures that may prevent or decrease complications (Diabetes Survival Skills Education) will improve Outcome: Progressing Goal: Individualized Educational Video(s) Outcome: Progressing   Problem: Coping: Goal: Ability to adjust to condition or change in health will improve Outcome: Progressing   Problem: Fluid Volume: Goal: Ability to maintain a balanced intake and output will improve Outcome: Progressing   Problem: Health Behavior/Discharge Planning: Goal: Ability to identify and utilize available resources and services will improve Outcome: Progressing Goal: Ability to manage health-related needs will improve Outcome:  Progressing   Problem: Metabolic: Goal: Ability to maintain appropriate glucose levels will improve Outcome: Progressing   Problem: Nutritional: Goal: Maintenance of adequate nutrition will improve Outcome: Progressing Goal: Progress toward achieving an optimal weight will improve Outcome: Progressing   Problem: Skin Integrity: Goal: Risk for impaired skin integrity will decrease Outcome: Progressing   Problem: Tissue Perfusion: Goal: Adequacy of tissue perfusion will improve Outcome: Progressing

## 2023-03-19 DIAGNOSIS — N170 Acute kidney failure with tubular necrosis: Secondary | ICD-10-CM | POA: Diagnosis not present

## 2023-03-19 DIAGNOSIS — E1159 Type 2 diabetes mellitus with other circulatory complications: Secondary | ICD-10-CM

## 2023-03-19 DIAGNOSIS — I152 Hypertension secondary to endocrine disorders: Secondary | ICD-10-CM | POA: Diagnosis not present

## 2023-03-19 LAB — RENAL FUNCTION PANEL
Albumin: 2.8 g/dL — ABNORMAL LOW (ref 3.5–5.0)
Anion gap: 12 (ref 5–15)
BUN: 41 mg/dL — ABNORMAL HIGH (ref 6–20)
CO2: 24 mmol/L (ref 22–32)
Calcium: 8.3 mg/dL — ABNORMAL LOW (ref 8.9–10.3)
Chloride: 102 mmol/L (ref 98–111)
Creatinine, Ser: 4.46 mg/dL — ABNORMAL HIGH (ref 0.61–1.24)
GFR, Estimated: 15 mL/min — ABNORMAL LOW (ref >60)
Glucose, Bld: 168 mg/dL — ABNORMAL HIGH (ref 70–99)
Phosphorus: 4.4 mg/dL (ref 2.5–4.6)
Potassium: 4.1 mmol/L (ref 3.5–5.1)
Sodium: 138 mmol/L (ref 135–145)

## 2023-03-19 LAB — GLUCOSE, CAPILLARY
Glucose-Capillary: 162 mg/dL — ABNORMAL HIGH (ref 70–99)
Glucose-Capillary: 144 mg/dL — ABNORMAL HIGH (ref 70–99)

## 2023-03-19 NOTE — Progress Notes (Signed)
 Received prelim renal biopsy report. ATN, no AIN.

## 2023-03-19 NOTE — Discharge Instructions (Addendum)
 Bryan [name],  You were in the hospital with low blood pressure of unknown reason. This was managed in the ICU. You developed kidney dysfunction because of this. Thankfully, your renal function is improving slowly. Please stay WELL HYDRATED as you might have a lot of urine output and get dehydrated easily. The kidney doctor has cleared you for outpatient management. Please follow-up with your PCP for labs in the next few days; it is important that you call them today for labs (metabolic panel) on Thursday or Friday. Please hold your medications as recommended.

## 2023-03-19 NOTE — Inpatient Diabetes Management (Addendum)
 Inpatient Diabetes Program Recommendations  AACE/ADA: New Consensus Statement on Inpatient Glycemic Control (2015)  Target Ranges:  Prepandial:   less than 140 mg/dL      Peak postprandial:   less than 180 mg/dL (1-2 hours)      Critically ill patients:  140 - 180 mg/dL   Lab Results  Component Value Date   GLUCAP 162 (H) 03/19/2023   HGBA1C >15.5 (H) 03/11/2023    Review of Glycemic Control  Latest Reference Range & Units 03/17/23 20:18 03/18/23 07:23 03/18/23 11:17 03/18/23 17:30 03/18/23 20:16 03/19/23 07:58  Glucose-Capillary 70 - 99 mg/dL 161 (H) 096 (H) 045 (H) 165 (H) 254 (H) 162 (H)   Diabetes history: DM 2 Outpatient Diabetes medications:  Lantus 10 units Daily Glipizide 10 mg bid Metformin 1000 mg bid Current orders for Inpatient glycemic control:  Lantus 10 units qhs Novolog 0-15 units tid + hs  Inpatient Diabetes Program Recommendations:  Consider increasing Lantus to 15 units daily. Spoke briefly with patient regarding sensor.  He has sample in bag for home, but will need Rx. For a FSL reader due to phone being incompatible.    Discharge Recommendations: Other recommendations: Start: [employer] 3 Sensor order # [id number], will also need reader order # [id number].Continue Latus 10 units Daily, Wegovy 0.25 mg weekly, Metformin 1 gm bid   DISCONTINUE: Glipizide 10 mg bid.   Use Adult Diabetes Insulin Treatment Post Discharge order set.  Thanks,  Lorenza Cambridge, RN, BC-ADM Inpatient Diabetes Coordinator Pager [phone]  (8a-5p)

## 2023-03-19 NOTE — TOC Transition Note (Signed)
 Transition of Care [facility]) - Discharge Note   Patient Details  Name: Bryan [name] MRN: [id number] Date of Birth: [birth year]  [facility] CM/SW Contact:  Tom-Johnson, Hershal Coria, RN Phone Number: 03/19/2023, 3:29 PM   Clinical Narrative:     Patient is scheduled for discharge today.  Readmission Risk Assessment done. Outpatient f/u, hospital f/u and discharge instructions on AVS. No TOC needs or recommendations noted. Family to transport at discharge.  No further TOC needs noted.          Final next level of care: Home/Self Care Barriers to Discharge: Barriers Resolved   Patient Goals and CMS Choice Patient states their goals for this hospitalization and ongoing recovery are:: To returnn home. CMS Medicare.gov Compare Post Acute Care list provided to:: Patient Choice offered to / list presented to : NA      Discharge Placement                Patient to be transferred to facility by: Family      Discharge Plan and Services Additional resources added to the After Visit Summary for                  DME Arranged: N/A DME Agency: NA       HH Arranged: NA HH Agency: NA        Social Drivers of Health (SDOH) Interventions SDOH Screenings   Food Insecurity: No Food Insecurity (03/11/2023)  Housing: Unknown (03/11/2023)  Transportation Needs: No Transportation Needs (03/11/2023)  Utilities: Not At Risk (03/11/2023)  Tobacco Use: Low Risk  (03/11/2023)     Readmission Risk Interventions    03/14/2023    2:39 PM  Readmission Risk Prevention Plan  Transportation Screening Complete  PCP or Specialist Appt within 5-7 Days Complete  Home Care Screening Complete  Medication Review (RN CM) Referral to Pharmacy

## 2023-03-19 NOTE — Discharge Summary (Signed)
 Physician Discharge Summary   Patient: Bryan [name] MRN: [id number] DOB: 1970/07/27  Admit date:     03/11/2023  Discharge date: 03/19/23  Discharge Physician: Jacquelin Hawking, MD   PCP: Mattie Marlin, DO   Recommendations at discharge:  PCP follow-up BMP in 3 days Nephrology follow-up for renal impairment and renal biopsy results  Discharge Diagnoses: Principal Problem:   Acute renal failure (ARF) (HCC) Active Problems:   OSA (obstructive sleep apnea)   Morbid (severe) obesity due to excess calories (HCC)   Lactic acid acidosis   Hypotension   Anemia   Hypertension associated with diabetes (HCC)   AKI (acute kidney injury) (HCC)   Shock (HCC)  Resolved Problems:   * No resolved hospital problems. *  Hospital Course: Bryan [name] is a 53 y.o. male with a history of diabetes, hypertension, hyperlipidemia.  Patient presented secondary to fatigue, nausea and vomiting and found to have AKI with associated hypotension and shock, requiring vasopressor support and ICU admission. Vasopressors weaned off. Renal biopsy performed on 2/28 with results pending. AKI improving prior to discharge. Patient cleared for discharge home with close follow-up per nephrology recommendations.  Assessment and Plan:  Undifferentiated shock Possible hypovolemic vs septic. V/Q scan was negative for PE. Patient was managed in the ICU and treated with IV fluids and Levophed. Vasopressors weaned off on 2/23 and patient transferred out of the ICU on 2/25. Patient was treated empirically with Vancomycin/Cefepime/Flagyl and transitioned to Zosyn with recommendation for 7 days of treatment. Zosyn held secondary to concern for possible AIN. Patient received a 3-day course of antibiotics   AKI Baseline of about 1.2-1.5. Creatinine of 5.39 on admission. Nephrology consulted. Concern for possible ATN from shock. Creatinine peak of 6.75. Biopsy performed 2/28. Creatinine trending down. Creatinine of  4.46 on day of discharge. Nephrology recommendation for outpatient follow-up. Biopsy pending on discharge.   Hyponatremia Secondary to AKI.  Nephrology on board. Sodium normalized. Hyponatremia resolved.   Right ventricular reduced function Patient noted to have mildly reduced RV systolic function. No PE on V/Q scan.   Diabetes mellitus type 2 Uncontrolled with hyperglycemia and hemoglobin A1C >15.5%. Continue Lantus on discharge, but hold glipizide, Wegovy, and metformin secondary to impaired renal function.   Primary hypertension Patient is on amlodipine, Coreg and lisinopril as an outpatient which were held secondary to shock.   Hyperlipidemia Continue Crestor   Consultants:  PCCM Nephrology   Procedures:  Transthoracic Echocardiogram Renal biopsy  Disposition: Home Diet recommendation: Renal diet   DISCHARGE MEDICATION: Allergies as of 03/19/2023       Reactions   Adhesive [tape] Hives, Other (See Comments)   NO PLASTIC tape, please!!        Medication List     PAUSE taking these medications    gabapentin 300 MG capsule Wait to take this until your doctor or other care provider tells you to start again. Commonly known as: NEURONTIN Take 300 mg by mouth in the morning. Total dose of 900 mg   gabapentin 600 MG tablet Wait to take this until your doctor or other care provider tells you to start again. Commonly known as: NEURONTIN Take 600 mg by mouth daily. Total dose of 900 mg   glipiZIDE 10 MG 24 hr tablet Wait to take this until your doctor or other care provider tells you to start again. Commonly known as: GLUCOTROL XL Take 10 mg by mouth 2 (two) times daily with a meal.   lisinopril 40 MG tablet  Wait to take this until your doctor or other care provider tells you to start again. Commonly known as: ZESTRIL Take 40 mg by mouth daily.   metFORMIN 1000 MG tablet Wait to take this until your doctor or other care provider tells you to start  again. Commonly known as: GLUCOPHAGE Take 1,000 mg by mouth in the morning and at bedtime.   Wegovy 0.25 MG/0.5ML Soaj Wait to take this until your doctor or other care provider tells you to start again. Generic drug: Semaglutide-Weight Management Inject 0.25 mg into the skin once a week.       STOP taking these medications    cyclobenzaprine 10 MG tablet Commonly known as: FLEXERIL       TAKE these medications    acetaminophen 325 MG tablet Commonly known as: TYLENOL Take 2 tablets (650 mg total) by mouth every 6 (six) hours as needed for mild pain (or temp > 100). What changed:  how much to take reasons to take this   albuterol 108 (90 Base) MCG/ACT inhaler Commonly known as: VENTOLIN HFA Inhale 2 puffs into the lungs every 6 (six) hours as needed for wheezing or shortness of breath.   amLODipine 10 MG tablet Commonly known as: NORVASC Take 1 tablet by mouth daily.   carvedilol 25 MG tablet Commonly known as: COREG Take 25 mg by mouth 2 (two) times daily with a meal. What changed: Another medication with the same name was removed. Continue taking this medication, and follow the directions you see here.   clobetasol 0.05 % external solution Commonly known as: TEMOVATE Apply 1 Application topically 2 (two) times daily.   HYDROcodone-acetaminophen 5-325 MG tablet Commonly known as: NORCO/VICODIN Take 1 tablet by mouth every 6 (six) hours as needed for severe pain (pain score 7-10) or moderate pain (pain score 4-6).   hydrOXYzine 50 MG tablet Commonly known as: ATARAX Take 50 mg by mouth at bedtime as needed.   Lantus SoloStar 100 UNIT/ML Solostar Pen Generic drug: insulin glargine Inject 10 Units into the skin daily.   minocycline 50 MG capsule Commonly known as: MINOCIN Take 50 mg by mouth daily as needed (for acne breakouts).   rosuvastatin 40 MG tablet Commonly known as: CRESTOR Take 40 mg by mouth daily.   tiZANidine 4 MG tablet Commonly known as:  ZANAFLEX Take 4 mg by mouth every 8 (eight) hours as needed for muscle spasms.        Follow-up Information     Anthony Sar, MD Follow up in 2 week(s).   Specialty: Nephrology Why: We will call with appt details Contact information: [address] [place] [address] [phone]         Lazoff, Shawn P, DO. Schedule an appointment as soon as possible for a visit in 3 day(s).   Specialty: Family Medicine Why: Metabolic panel. Hospital follow-up. Contact information: [address] [place] [address] [phone]                Discharge Exam: BP 130/86   Pulse 98   Temp 98 F (36.7 C) (Oral)   Resp 16   Ht 5\' 10"  (1.778 m)   Wt 116.7 kg   SpO2 98%   BMI 36.90 kg/m   General exam: Appears calm and comfortable Respiratory system: Respiratory effort normal. Central nervous system: Alert and oriented. No focal neurological deficits. Psychiatry: Judgement and insight appear normal. Mood & affect appropriate.   Condition at discharge: stable  The results of  significant diagnostics from this hospitalization (including imaging, microbiology, ancillary and laboratory) are listed below for reference.   Imaging Studies: US BIOPSY (KIDNEY) Result Date: 03/16/2023 INDICATION: 66 year old with acute kidney injury.  Request for a renal biopsy. EXAM: ULTRASOUND-GUIDED RANDOM RENAL BIOPSY MEDICATIONS: Moderate sedation ANESTHESIA/SEDATION: Moderate (conscious) sedation was employed during this procedure. A total of Versed 2 mg and Fentanyl 100 mcg was administered intravenously by the radiology nurse. Total intra-service moderate Sedation Time: 22 minutes. The patient's level of consciousness and vital signs were monitored continuously by radiology nursing throughout the procedure under my direct supervision. FLUOROSCOPY TIME:  None COMPLICATIONS: None immediate. PROCEDURE: Informed written consent was obtained from the patient after a thorough discussion of the  procedural risks, benefits and alternatives. All questions were addressed. A timeout was performed prior to the initiation of the procedure. Patient was placed prone. Both kidneys were evaluated with ultrasound. Left kidney was selected for biopsy. Left flank was prepped and draped in sterile fashion. Maximal barrier sterile technique was utilized including caps, mask, sterile gowns, sterile gloves, sterile drape, hand hygiene and skin antiseptic. Skin was anesthetized using 1% lidocaine. Small incision was made. Using ultrasound guidance, a 17 gauge coaxial needle was directed to the lower pole cortex. Two core biopsies were obtained from the lower pole cortex with an 18 gauge core device. Specimens placed in saline. Gel-Foam slurry was injected as the 17 gauge coaxial needle was retracted. Bandage placed over the puncture site. FINDINGS: 2 adequate core biopsy specimens were obtained. No immediate bleeding or hematoma formation. IMPRESSION: Successful ultrasound-guided left renal biopsy. Electronically Signed   By: Richarda Overlie M.D.   On: 03/16/2023 13:49   US RENAL Result Date: 03/14/2023 CLINICAL DATA:  Acute kidney injury EXAM: RENAL / URINARY TRACT ULTRASOUND COMPLETE COMPARISON:  CT 03/11/2023 and older.  Renal ultrasound 2019 June. FINDINGS: Right Kidney: Renal measurements: 13.6 x 6.3 x 6.2 cm = volume: 278 mL. Echogenicity within normal limits. No mass or hydronephrosis visualized. Left Kidney: Renal measurements: 13.1 x 6.5 x 6.1 cm = volume: 271 mL. Echogenicity within normal limits. No mass or hydronephrosis visualized. Bladder: Appears normal for degree of bladder distention. Other: None. IMPRESSION: No collecting system dilatation. Electronically Signed   By: Karen Kays M.D.   On: 03/14/2023 17:49   NM Pulmonary Perfusion Result Date: 03/13/2023 CLINICAL DATA:  Positive D-dimer, PE suspected low to intermediate probability, respiratory distress EXAM: NUCLEAR MEDICINE PERFUSION LUNG SCAN  TECHNIQUE: Perfusion images were obtained in multiple projections after intravenous injection of radiopharmaceutical. Ventilation scans intentionally deferred if perfusion scan and chest x-ray adequate for interpretation. RADIOPHARMACEUTICALS:  4.0 mCi Tc-19m MAA IV COMPARISON:  Chest x-ray 225.5 FINDINGS: Planar images of the lungs are obtained in multiple projections during the perfusion exam. There are no perfusion defects identified. IMPRESSION: 1. Normal perfusion scan.  No evidence of pulmonary embolus. Electronically Signed   By: Sharlet Salina M.D.   On: 03/13/2023 18:16   DG CHEST PORT 1 VIEW Result Date: 03/13/2023 CLINICAL DATA:  Respiratory distress EXAM: PORTABLE CHEST 1 VIEW COMPARISON:  Chest x-ray 03/11/2023 FINDINGS: The heart size and mediastinal contours are within normal limits. Both lungs are clear. The visualized skeletal structures are unremarkable. IMPRESSION: No active disease. Electronically Signed   By: Darliss Cheney M.D.   On: 03/13/2023 17:09   VAS [place] LOWER EXTREMITY VENOUS (DVT) Result Date: 03/13/2023  Lower Venous DVT Study Patient Name:  Bryan [name]  Date of Exam:   03/13/2023 Medical Rec #:  [id number]               Accession #:    [id number] Date of Birth: 11-11-1970                Patient Gender: M Patient Age:   59 years Exam Location:  [facility] Procedure:      VAS [place] LOWER EXTREMITY VENOUS (DVT) Referring Phys: Karie Fetch --------------------------------------------------------------------------------  Indications: Edema. Other Indications: Recent injury/fall. Comparison Study: No prior exam. Performing Technologist: Fernande Bras  Examination Guidelines: A complete evaluation includes B-mode imaging, spectral Doppler, color Doppler, and power Doppler as needed of all accessible portions of each vessel. Bilateral testing is considered an integral part of a complete examination. Limited examinations for reoccurring indications may be performed as  noted. The reflux portion of the exam is performed with the patient in reverse Trendelenburg.  +---------+---------------+---------+-----------+----------+--------------+ RIGHT    CompressibilityPhasicitySpontaneityPropertiesThrombus Aging +---------+---------------+---------+-----------+----------+--------------+ CFV      Full           Yes      Yes                                 +---------+---------------+---------+-----------+----------+--------------+ SFJ      Full           Yes      Yes                                 +---------+---------------+---------+-----------+----------+--------------+ FV Prox  Full                                                        +---------+---------------+---------+-----------+----------+--------------+ FV Mid   Full                                                        +---------+---------------+---------+-----------+----------+--------------+ FV DistalFull                                                        +---------+---------------+---------+-----------+----------+--------------+ PFV      Full                                                        +---------+---------------+---------+-----------+----------+--------------+ POP      Full           Yes      Yes                                 +---------+---------------+---------+-----------+----------+--------------+ PTV      Full                                                        +---------+---------------+---------+-----------+----------+--------------+  PERO     Full                                                        +---------+---------------+---------+-----------+----------+--------------+   +---------+---------------+---------+-----------+----------+--------------+ LEFT     CompressibilityPhasicitySpontaneityPropertiesThrombus Aging +---------+---------------+---------+-----------+----------+--------------+ CFV      Full            Yes      Yes                                 +---------+---------------+---------+-----------+----------+--------------+ SFJ      Full           Yes      Yes                                 +---------+---------------+---------+-----------+----------+--------------+ FV Prox  Full                                                        +---------+---------------+---------+-----------+----------+--------------+ FV Mid   Full                                                        +---------+---------------+---------+-----------+----------+--------------+ FV DistalFull                                                        +---------+---------------+---------+-----------+----------+--------------+ PFV      Full                                                        +---------+---------------+---------+-----------+----------+--------------+ POP      Full           Yes      Yes                                 +---------+---------------+---------+-----------+----------+--------------+ PTV      Full                                                        +---------+---------------+---------+-----------+----------+--------------+ PERO     Full                                                        +---------+---------------+---------+-----------+----------+--------------+  Summary: BILATERAL: - No evidence of deep vein thrombosis seen in the lower extremities, bilaterally. -No evidence of popliteal cyst, bilaterally.   *See table(s) above for measurements and observations. Electronically signed by Lemar Livings MD on 03/13/2023 at 3:30:30 PM.    Final    ECHOCARDIOGRAM COMPLETE Result Date: 03/12/2023    ECHOCARDIOGRAM REPORT   Patient Name:   Bryan [name] Date of Exam: 03/12/2023 Medical Rec #:  [id number]              Height:       70.0 in Accession #:    [id number]             Weight:       265.0 lb Date of Birth:  [birth year]               BSA:           2.352 m Patient Age:    53 years               BP:           90/72 mmHg Patient Gender: M                      HR:           90 bpm. Exam Location:  Inpatient Procedure: 2D Echo and Intracardiac Opacification Agent (Both Spectral and Color            Flow Doppler were utilized during procedure). Indications:    Shock  History:        Patient has no prior history of Echocardiogram examinations.                 Risk Factors:Non-Smoker, Hypertension and Diabetes.  Sonographer:    Dondra Prader RVT RCS Referring Phys: Cristopher Peru IMPRESSIONS  1. Left ventricular ejection fraction, by estimation, is 55 to 60%. The left ventricle has normal function. The left ventricle has no regional wall motion abnormalities. Left ventricular diastolic parameters are consistent with Grade I diastolic dysfunction (impaired relaxation).  2. D-shaped interventricular septum suggestive of RV pressure/volume overload. Right ventricular systolic function is mildly reduced. The right ventricular size is normal. Tricuspid regurgitation signal is inadequate for assessing PA pressure.  3. The mitral valve is normal in structure. No evidence of mitral valve regurgitation. No evidence of mitral stenosis.  4. The aortic valve is tricuspid. Aortic valve regurgitation is not visualized. No aortic stenosis is present.  5. The inferior vena cava is dilated in size with <50% respiratory variability, suggesting right atrial pressure of 15 mmHg. FINDINGS  Left Ventricle: Left ventricular ejection fraction, by estimation, is 55 to 60%. The left ventricle has normal function. The left ventricle has no regional wall motion abnormalities. Definity contrast agent was given IV to delineate the left ventricular  endocardial borders. Strain imaging was not performed. The left ventricular internal cavity size was normal in size. There is no left ventricular hypertrophy. Left ventricular diastolic parameters are consistent with Grade I diastolic dysfunction  (impaired relaxation). Right Ventricle: D-shaped interventricular septum suggestive of RV pressure/volume overload. The right ventricular size is normal. No increase in right ventricular wall thickness. Right ventricular systolic function is mildly reduced. Tricuspid regurgitation signal is inadequate for assessing PA pressure. Left Atrium: Left atrial size was normal in size. Right Atrium: Right atrial size was normal in size. Pericardium: There is no evidence of pericardial effusion. Mitral Valve: The mitral valve is normal in  structure. No evidence of mitral valve regurgitation. No evidence of mitral valve stenosis. Tricuspid Valve: The tricuspid valve is normal in structure. Tricuspid valve regurgitation is not demonstrated. Aortic Valve: The aortic valve is tricuspid. Aortic valve regurgitation is not visualized. No aortic stenosis is present. Aortic valve mean gradient measures 6.0 mmHg. Aortic valve peak gradient measures 10.5 mmHg. Aortic valve area, by VTI measures 2.23  cm. Pulmonic Valve: The pulmonic valve was normal in structure. Pulmonic valve regurgitation is not visualized. Aorta: The aortic root is normal in size and structure. Venous: The inferior vena cava is dilated in size with less than 50% respiratory variability, suggesting right atrial pressure of 15 mmHg. IAS/Shunts: No atrial level shunt detected by color flow Doppler. Additional Comments: 3D imaging was not performed.  LEFT VENTRICLE PLAX 2D LVIDd:         4.60 cm   Diastology LVIDs:         3.40 cm   LV e' medial:    11.10 cm/s LV PW:         1.00 cm   LV E/e' medial:  8.0 LV IVS:        1.10 cm   LV e' lateral:   12.10 cm/s LVOT diam:     2.00 cm   LV E/e' lateral: 7.3 LV SV:         56 LV SV Index:   24 LVOT Area:     3.14 cm  RIGHT VENTRICLE             IVC RV Basal diam:  3.30 cm     IVC diam: 2.30 cm RV S prime:     17.60 cm/s TAPSE (M-mode): 1.4 cm LEFT ATRIUM             Index        RIGHT ATRIUM           Index LA diam:         3.70 cm 1.57 cm/m   RA Area:     10.70 cm LA Vol (A2C):   59.5 ml 25.29 ml/m  RA Volume:   21.90 ml  9.31 ml/m LA Vol (A4C):   60.5 ml 25.72 ml/m LA Biplane Vol: 59.7 ml 25.38 ml/m  AORTIC VALVE                     PULMONIC VALVE AV Area (Vmax):    2.13 cm      PV Vmax:       1.08 m/s AV Area (Vmean):   2.11 cm      PV Peak grad:  4.7 mmHg AV Area (VTI):     2.23 cm AV Vmax:           162.00 cm/s AV Vmean:          109.000 cm/s AV VTI:            0.252 m AV Peak Grad:      10.5 mmHg AV Mean Grad:      6.0 mmHg LVOT Vmax:         110.00 cm/s LVOT Vmean:        73.300 cm/s LVOT VTI:          0.179 m LVOT/AV VTI ratio: 0.71  AORTA Ao Root diam: 3.30 cm Ao Asc diam:  3.30 cm MITRAL VALVE MV Area (PHT): 4.68 cm    SHUNTS MV Decel Time: 162 msec    Systemic VTI:  0.18 m MV E velocity: 88.50 cm/s  Systemic Diam: 2.00 cm MV A velocity: 85.20 cm/s MV E/A ratio:  1.04 Dalton McleanMD Electronically signed by Wilfred Lacy Signature Date/Time: 03/12/2023/9:29:50 AM    Final    CT ABDOMEN PELVIS WO CONTRAST Result Date: 03/11/2023 CLINICAL DATA:  53 year old male with urinary retention, fatigue, low blood pressure. EXAM: CT ABDOMEN AND PELVIS WITHOUT CONTRAST TECHNIQUE: Multidetector CT imaging of the abdomen and pelvis was performed following the standard protocol without IV contrast. RADIATION DOSE REDUCTION: This exam was performed according to the departmental dose-optimization program which includes automated exposure control, adjustment of the mA and/or kV according to patient size and/or use of iterative reconstruction technique. COMPARISON:  CT Abdomen and Pelvis 09/26/2017. FINDINGS: Lower chest: No cardiomegaly or pericardial effusion. Mild linear lung base atelectasis or scarring. Hepatobiliary: Negative noncontrast liver and gallbladder. Pancreas: Partial fatty atrophy. Spleen: Negative. Adrenals/Urinary Tract: Normal adrenal glands. Nonobstructed kidneys. No nephrolithiasis. Normal ureters.  Unremarkable bladder. Stomach/Bowel: Chronic anastomosis at the junction of the descending and sigmoid colon with no adverse features. Mild large bowel retained stool. No large bowel inflammation identified. Evidence of previous appendectomy on series 2, image 82. Nondilated small bowel. Chronic right abdominal small bowel anastomosis on series 2, image 69 with no adverse features. Overlying chronic postoperative changes to the ventral abdominal wall there, probably a chronic ostomy takedown site. No ventral abdominal bowel hernia. Retained fluid and gas in the stomach. Duodenum is nondilated. No free air, free fluid, or mesenteric inflammation identified. Vascular/Lymphatic: Normal caliber abdominal aorta. Mild Aortoiliac calcified atherosclerosis. Vascular patency is not evaluated in the absence of IV contrast. No lymphadenopathy. Reproductive: Small fat containing inguinal hernias. Other: No pelvic free fluid. Musculoskeletal: No acute osseous abnormality identified. 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 Advanced chronic L4-L5 degeneration. IMPRESSION: 1. Chronic postoperative changes to the large and small bowel with no adverse features. 2. No urinary calculus. No acute or inflammatory process identified in the noncontrast abdomen or pelvis. 3. Mild Aortic Atherosclerosis (ICD10-I70.0). Electronically Signed   By: Odessa Fleming M.D.   On: 03/11/2023 12:33   DG Chest Portable 1 View Result Date: 03/11/2023 CLINICAL DATA:  Fatigue, hypertension. EXAM: PORTABLE CHEST 1 VIEW COMPARISON:  December 28, 2021. FINDINGS: The heart size and mediastinal contours are within normal limits. Hypoinflation of the lungs is noted. Both lungs are clear. The visualized skeletal structures are unremarkable. IMPRESSION: Hypoinflation of the lungs.  No active disease. Electronically Signed   By: Lupita Raider M.D.   On: 03/11/2023 11:54    Microbiology: Results for orders placed or performed during the  hospital encounter of 03/11/23  Resp panel by RT-PCR (RSV, Flu A&B, Covid) Anterior Nasal Swab     Status: None   Collection Time: 03/11/23 11:16 AM   Specimen: Anterior Nasal Swab  Result Value Ref Range Status   SARS Coronavirus 2 by RT PCR NEGATIVE NEGATIVE Final    Comment: (NOTE) SARS-CoV-2 target nucleic acids are NOT DETECTED.  The SARS-CoV-2 RNA is generally detectable in upper respiratory specimens during the acute phase of infection. The lowest concentration of SARS-CoV-2 viral copies this assay can detect is 138 copies/mL. A negative result does not preclude SARS-Cov-2 infection and should not be used as the sole basis for treatment or other patient management decisions. A negative result may occur with  improper specimen collection/handling, submission of specimen other than nasopharyngeal swab, presence of viral mutation(s) within the areas targeted by this assay, and inadequate number  of viral copies(<138 copies/mL). A negative result must be combined with clinical observations, patient history, and epidemiological information. The expected result is Negative.  Fact Sheet for Patients:  BloggerCourse.com  Fact Sheet for Healthcare Providers:  SeriousBroker.it  This test is no t yet approved or cleared by the [place] FDA and  has been authorized for detection and/or diagnosis of SARS-CoV-2 by FDA under an Emergency Use Authorization (EUA). This EUA will remain  in effect (meaning this test can be used) for the duration of the COVID-19 declaration under Section 564(b)(1) of the Act, 21 U.S.C.section 360bbb-3(b)(1), unless the authorization is terminated  or revoked sooner.       Influenza A by PCR NEGATIVE NEGATIVE Final   Influenza B by PCR NEGATIVE NEGATIVE Final    Comment: (NOTE) The Xpert Xpress SARS-CoV-2/FLU/RSV plus assay is intended as an aid in the diagnosis of influenza from Nasopharyngeal swab  specimens and should not be used as a sole basis for treatment. Nasal washings and aspirates are unacceptable for Xpert Xpress SARS-CoV-2/FLU/RSV testing.  Fact Sheet for Patients: BloggerCourse.com  Fact Sheet for Healthcare Providers: SeriousBroker.it  This test is not yet approved or cleared by the [place] FDA and has been authorized for detection and/or diagnosis of SARS-CoV-2 by FDA under an Emergency Use Authorization (EUA). This EUA will remain in effect (meaning this test can be used) for the duration of the COVID-19 declaration under Section 564(b)(1) of the Act, 21 U.S.C. section 360bbb-3(b)(1), unless the authorization is terminated or revoked.     Resp Syncytial Virus by PCR NEGATIVE NEGATIVE Final    Comment: (NOTE) Fact Sheet for Patients: BloggerCourse.com  Fact Sheet for Healthcare Providers: SeriousBroker.it  This test is not yet approved or cleared by the [place] FDA and has been authorized for detection and/or diagnosis of SARS-CoV-2 by FDA under an Emergency Use Authorization (EUA). This EUA will remain in effect (meaning this test can be used) for the duration of the COVID-19 declaration under Section 564(b)(1) of the Act, 21 U.S.C. section 360bbb-3(b)(1), unless the authorization is terminated or revoked.  Performed at [facility], [address] [place], [place] [address]   Culture, blood (routine x 2)     Status: None   Collection Time: 03/11/23 11:50 AM   Specimen: BLOOD RIGHT ARM  Result Value Ref Range Status   Specimen Description   Final    BLOOD RIGHT ARM Performed at [facility], [address], [place], [place] [address]    Special Requests   Final    BOTTLES DRAWN AEROBIC AND ANAEROBIC Blood Culture results may not be optimal due to an excessive volume of blood received in culture bottles Performed  at [facility], [address] [place], [place] [address]    Culture   Final    NO GROWTH 5 DAYS Performed at [facility], [address], [place], [place] [address]    Report Status 03/16/2023 FINAL  Final  Culture, blood (routine x 2)     Status: None   Collection Time: 03/11/23 11:50 AM   Specimen: BLOOD LEFT ARM  Result Value Ref Range Status   Specimen Description   Final    BLOOD LEFT ARM Performed at [facility], [address], [place], [place] [address]    Special Requests   Final    BOTTLES DRAWN AEROBIC AND ANAEROBIC Blood Culture results may not be optimal due to an excessive volume of blood received  in culture bottles Performed at [facility], [address] [place], [place] [address]    Culture   Final    NO GROWTH 5 DAYS Performed at [facility], [address], [place], [place] [address]    Report Status 03/16/2023 FINAL  Final  MRSA Next Gen by PCR, Nasal     Status: None   Collection Time: 03/11/23  8:05 PM   Specimen: Nasal Mucosa; Nasal Swab  Result Value Ref Range Status   MRSA by PCR Next Gen NOT DETECTED NOT DETECTED Final    Comment: (NOTE) The GeneXpert MRSA Assay (FDA approved for NASAL specimens only), is one component of a comprehensive MRSA colonization surveillance program. It is not intended to diagnose MRSA infection nor to guide or monitor treatment for MRSA infections. Test performance is not FDA approved in patients less than 63 years old. Performed at [facility], [address], [place], [place] [address]   Urine Culture (for pregnant, neutropenic or urologic patients or patients with an indwelling urinary catheter)     Status: None   Collection Time: 03/11/23  8:11 PM   Specimen: Urine, Catheterized  Result Value Ref Range Status   Specimen Description URINE, CATHETERIZED  Final   Special Requests NONE  Final   Culture   Final    NO GROWTH Performed at [facility], [address], [place], [place] [address]    Report Status 03/12/2023 FINAL  Final    Labs: CBC: Recent Labs  Lab 03/13/23 0222 03/14/23 0419 03/16/23 0620  WBC 8.8 7.8 9.1  HGB 11.1* 11.0* 12.6*  HCT 32.4* 31.3* 35.3*  MCV 78.3* 77.1* 78.4*  PLT 175 187 338   Basic Metabolic Panel: Recent Labs  Lab 03/13/23 0222 03/14/23 0419 03/14/23 2322 03/16/23 1028 03/17/23 0522 03/18/23 0836 03/19/23 0434  NA 124* 122* 125* 130* 136 135 138  K 3.6 4.0 4.3 4.4 3.7 3.8 4.1  CL 91* 91* 88* 95* 98 99 102  CO2 19* 19* 21* 21* 20* 24 24  GLUCOSE 157* 198* 147* 147* 182* 164* 168*  BUN 43* 47* 50* 48* 48* 44* 41*  CREATININE 4.46* 5.52* 6.29* 6.75* 6.22* 5.05* 4.46*  CALCIUM 7.9* 7.6* 7.7* 8.1* 8.1* 8.1* 8.3*  MG 1.8 1.7  --   --   --   --   --   PHOS 5.8* 7.1*  --  7.9* 6.9* 4.9* 4.4   Liver Function Tests: Recent Labs  Lab 03/16/23 1028 03/17/23 0522 03/18/23 0836 03/19/23 0434  ALBUMIN 2.4* 2.5* 2.6* 2.8*   CBG: Recent Labs  Lab 03/18/23 1117 03/18/23 1730 03/18/23 2016 03/19/23 0758 03/19/23 1146  GLUCAP 151* 165* 254* 162* 144*    Discharge time spent: 35 minutes.  Signed: Jacquelin Hawking, MD [facility] 03/19/2023

## 2023-03-19 NOTE — Progress Notes (Signed)
 PROGRESS NOTE    Bryan [name]  ZOX:[id number] DOB: 1970-11-14 DOA: 03/11/2023 PCP: Mattie Marlin, DO   Brief Narrative: Bryan [name] is a 53 y.o. male with a history of diabetes, hypertension, hyperlipidemia.  Patient presented secondary to fatigue, nausea and vomiting and found to have AKI with associated hypotension and shock, requiring vasopressor support and ICU admission. Vasopressors weaned off. AKI persists.   Assessment and Plan:  Undifferentiated shock Possible hypovolemic vs septic. V/Q scan was negative for PE. Patient was managed in the ICU and treated with IV fluids and Levophed. Vasopressors weaned off on 2/23 and patient transferred out of the ICU on 2/25. Patient was treated empirically with Vancomycin/Cefepime/Flagyl and transitioned to Zosyn with recommendation for 7 days of treatment. Zosyn held secondary to concern for possible AIN. Patient received a 3-day course of antibiotics  AKI Baseline of about 1.2-1.5. Creatinine of 5.39 on admission. Nephrology consulted. Concern for possible ATN from shock. Creatinine peak of 6.75. Biopsy performed 2/28. Creatinine trending down. -Nephrology recommendations: supportive care, follow-up biopsy results  Hyponatremia Secondary to AKI.  Nephrology on board. Sodium normalized. Hyponatremia resolved.  Right ventricular reduced function Patient noted to have mildly reduced RV systolic function. No PE on V/Q scan.  Diabetes mellitus type 2 Uncontrolled with hyperglycemia and hemoglobin A1C >15.5% -Continue SSI -Continue insulin glargine 10 units  Primary hypertension Patient is on amlodipine, Coreg and lisinopril as an outpatient which were held secondary to shock.  Hyperlipidemia -Continue Crestor   DVT prophylaxis: Heparin Code Status:   Code Status: Full Code Family Communication: None at bedside Disposition Plan: Discharge pending improvement in AKI and continued nephrology  recommendations/management   Consultants:  PCCM Nephrology  Procedures:  Transthoracic Echocardiogram  Antimicrobials: Vancomycin Zosyn Cefepime Flagyl    Subjective: No issues from overnight. No concerns this morning.  Objective: BP 130/86   Pulse 98   Temp 98 F (36.7 C) (Oral)   Resp 16   Ht 5\' 10"  (1.778 m)   Wt 116.7 kg   SpO2 98%   BMI 36.90 kg/m   Examination:  General exam: Appears calm and comfortable    Data Reviewed: I have personally reviewed following labs and imaging studies  CBC Lab Results  Component Value Date   WBC 9.1 03/16/2023   RBC 4.50 03/16/2023   HGB 12.6 (L) 03/16/2023   HCT 35.3 (L) 03/16/2023   MCV 78.4 (L) 03/16/2023   MCH 28.0 03/16/2023   PLT 338 03/16/2023   MCHC 35.7 03/16/2023   RDW 13.9 03/16/2023   LYMPHSABS 1.5 12/30/2021   MONOABS 0.6 12/30/2021   EOSABS 0.2 12/30/2021   BASOSABS 0.1 12/30/2021     Last metabolic panel Lab Results  Component Value Date   NA 138 03/19/2023   K 4.1 03/19/2023   CL 102 03/19/2023   CO2 24 03/19/2023   BUN 41 (H) 03/19/2023   CREATININE 4.46 (H) 03/19/2023   GLUCOSE 168 (H) 03/19/2023   GFRNONAA 15 (L) 03/19/2023   GFRAA >60 06/10/2019   CALCIUM 8.3 (L) 03/19/2023   PHOS 4.4 03/19/2023   PROT 8.1 03/11/2023   ALBUMIN 2.8 (L) 03/19/2023   BILITOT 0.7 03/11/2023   ALKPHOS 83 03/11/2023   AST 19 03/11/2023   ALT 10 03/11/2023   ANIONGAP 12 03/19/2023    GFR: Estimated Creatinine Clearance: 24 mL/min (A) (by C-G formula based on SCr of 4.46 mg/dL (H)).  Recent Results (from the past 240 hours)  Resp panel by RT-PCR (RSV, Flu  A&B, Covid) Anterior Nasal Swab     Status: None   Collection Time: 03/11/23 11:16 AM   Specimen: Anterior Nasal Swab  Result Value Ref Range Status   SARS Coronavirus 2 by RT PCR NEGATIVE NEGATIVE Final    Comment: (NOTE) SARS-CoV-2 target nucleic acids are NOT DETECTED.  The SARS-CoV-2 RNA is generally detectable in upper  respiratory specimens during the acute phase of infection. The lowest concentration of SARS-CoV-2 viral copies this assay can detect is 138 copies/mL. A negative result does not preclude SARS-Cov-2 infection and should not be used as the sole basis for treatment or other patient management decisions. A negative result may occur with  improper specimen collection/handling, submission of specimen other than nasopharyngeal swab, presence of viral mutation(s) within the areas targeted by this assay, and inadequate number of viral copies(<138 copies/mL). A negative result must be combined with clinical observations, patient history, and epidemiological information. The expected result is Negative.  Fact Sheet for Patients:  BloggerCourse.com  Fact Sheet for Healthcare Providers:  SeriousBroker.it  This test is no t yet approved or cleared by the [place] FDA and  has been authorized for detection and/or diagnosis of SARS-CoV-2 by FDA under an Emergency Use Authorization (EUA). This EUA will remain  in effect (meaning this test can be used) for the duration of the COVID-19 declaration under Section 564(b)(1) of the Act, 21 U.S.C.section 360bbb-3(b)(1), unless the authorization is terminated  or revoked sooner.       Influenza A by PCR NEGATIVE NEGATIVE Final   Influenza B by PCR NEGATIVE NEGATIVE Final    Comment: (NOTE) The Xpert Xpress SARS-CoV-2/FLU/RSV plus assay is intended as an aid in the diagnosis of influenza from Nasopharyngeal swab specimens and should not be used as a sole basis for treatment. Nasal washings and aspirates are unacceptable for Xpert Xpress SARS-CoV-2/FLU/RSV testing.  Fact Sheet for Patients: BloggerCourse.com  Fact Sheet for Healthcare Providers: SeriousBroker.it  This test is not yet approved or cleared by the [place] FDA and has been  authorized for detection and/or diagnosis of SARS-CoV-2 by FDA under an Emergency Use Authorization (EUA). This EUA will remain in effect (meaning this test can be used) for the duration of the COVID-19 declaration under Section 564(b)(1) of the Act, 21 U.S.C. section 360bbb-3(b)(1), unless the authorization is terminated or revoked.     Resp Syncytial Virus by PCR NEGATIVE NEGATIVE Final    Comment: (NOTE) Fact Sheet for Patients: BloggerCourse.com  Fact Sheet for Healthcare Providers: SeriousBroker.it  This test is not yet approved or cleared by the [place] FDA and has been authorized for detection and/or diagnosis of SARS-CoV-2 by FDA under an Emergency Use Authorization (EUA). This EUA will remain in effect (meaning this test can be used) for the duration of the COVID-19 declaration under Section 564(b)(1) of the Act, 21 U.S.C. section 360bbb-3(b)(1), unless the authorization is terminated or revoked.  Performed at [facility], [address], [place], [place] [address]   Culture, blood (routine x 2)     Status: None   Collection Time: 03/11/23 11:50 AM   Specimen: BLOOD RIGHT ARM  Result Value Ref Range Status   Specimen Description   Final    BLOOD RIGHT ARM Performed at [facility], [address] [place], [place] [address]    Special Requests   Final    BOTTLES DRAWN AEROBIC AND ANAEROBIC Blood Culture results may not be optimal due to an excessive volume of blood received  in culture bottles Performed at [facility], [address] [place], [place] [address]    Culture   Final    NO GROWTH 5 DAYS Performed at [facility], [address] [place], [place] [address]    Report Status 03/16/2023 FINAL  Final  Culture, blood (routine x 2)     Status: None   Collection Time: 03/11/23 11:50 AM   Specimen: BLOOD LEFT ARM  Result Value Ref Range Status    Specimen Description   Final    BLOOD LEFT ARM Performed at [facility], [address], [place], [place] [address]    Special Requests   Final    BOTTLES DRAWN AEROBIC AND ANAEROBIC Blood Culture results may not be optimal due to an excessive volume of blood received in culture bottles Performed at [facility], [address] [place], [place] [address]    Culture   Final    NO GROWTH 5 DAYS Performed at [facility], [address], [place], [place] [address]    Report Status 03/16/2023 FINAL  Final  MRSA Next Gen by PCR, Nasal     Status: None   Collection Time: 03/11/23  8:05 PM   Specimen: Nasal Mucosa; Nasal Swab  Result Value Ref Range Status   MRSA by PCR Next Gen NOT DETECTED NOT DETECTED Final    Comment: (NOTE) The GeneXpert MRSA Assay (FDA approved for NASAL specimens only), is one component of a comprehensive MRSA colonization surveillance program. It is not intended to diagnose MRSA infection nor to guide or monitor treatment for MRSA infections. Test performance is not FDA approved in patients less than 57 years old. Performed at [facility], [address] [place], [place] [address]   Urine Culture (for pregnant, neutropenic or urologic patients or patients with an indwelling urinary catheter)     Status: None   Collection Time: 03/11/23  8:11 PM   Specimen: Urine, Catheterized  Result Value Ref Range Status   Specimen Description URINE, CATHETERIZED  Final   Special Requests NONE  Final   Culture   Final    NO GROWTH Performed at [facility], [address], [place], [place] [address]    Report Status 03/12/2023 FINAL  Final      Radiology Studies: No results found.     LOS: 8 days    Jacquelin Hawking, MD [facility] 03/19/2023, 11:11 AM   If 7PM-7AM, please contact night-coverage [email or web address]

## 2023-03-19 NOTE — Progress Notes (Signed)
 DISCHARGE NOTE HOME Bryan [name] to be discharged Home per MD order. Discussed prescriptions and follow up appointments with the patient. Prescriptions given to patient; medication list explained in detail. Patient verbalized understanding.  Skin clean, dry and intact without evidence of skin break down, no evidence of skin tears noted. IV catheter discontinued intact. Site without signs and symptoms of complications. Dressing and pressure applied. Pt denies pain at the site currently. No complaints noted.  Patient free of lines, drains, and wounds other than noted in [facility]  An After Visit Summary (AVS) was printed and given to the patient. Patient escorted via wheelchair, and discharged home via private auto.  Velia Meyer, RN

## 2023-03-19 NOTE — Progress Notes (Signed)
  [facility] Progress Note    Assessment/ Plan:   AKI -suspecting AKI is secondary to ATN from shock. No obstruction on initial CT.  - now in recovery phase, downtrending SCr and BUN, K ok, and UOP excellent; ok for outpt lab monitoring with PCP -Proceeded with renal biopsy 2/27 given concerns for AIN.  Is off Zosyn now. Pending path. Likely going to end up seeing acute tubular injury as main driver of AKI. Can f/u those results as outpt.   -GN serologies neg -Avoid nephrotoxic medications including NSAIDs and iodinated intravenous contrast exposure unless the latter is absolutely indicated.  Preferred narcotic agents for pain control are hydromorphone, fentanyl, and methadone. Morphine should not be used. Avoid Baclofen and avoid oral sodium phosphate and magnesium citrate based laxatives / bowel preps. Continue strict Input and Output monitoring. Will monitor the patient closely with you and intervene or adjust therapy as indicated by changes in clinical status/labs    Hyponatremia -asymptomatic -suspecting this was related AKI -resolved   Shock H/o HTN -etiology unclear -off pressors -per primary -improved   Acidosis -mild, monitor for now, resolved   Anemia -Transfuse for Hgb<7 g/dL -last hgb stable/acceptable   Uncontrolled Diabetes Mellitus Type 2 with Hyperglycemia -per primary   Subjective:    3.8L UOP Feels good SCr downtrending, K 4.1, HCO3 24 Wants to discharge   Objective:   BP 130/86   Pulse 98   Temp 98 F (36.7 C) (Oral)   Resp 16   Ht 5\' 10"  (1.778 m)   Wt 116.7 kg   SpO2 98%   BMI 36.90 kg/m   Intake/Output Summary (Last 24 hours) at 03/19/2023 1151 Last data filed at 03/19/2023 1610 Gross per 24 hour  Intake 1340 ml  Output 3300 ml  Net -1960 ml   Weight change: -0.454 kg  Physical Exam: Gen: NAD, laying flat in bed CVS: RRR Resp: Normal work of breathing, unlabored Abd: soft, nt/nd, obese Ext: no sig edema Neuro:  awake, alert, no myoclonic jerking observed  Imaging: No results found.   Labs: BMET Recent Labs  Lab 03/13/23 0222 03/14/23 0419 03/14/23 2322 03/16/23 1028 03/17/23 0522 03/18/23 0836 03/19/23 0434  NA 124* 122* 125* 130* 136 135 138  K 3.6 4.0 4.3 4.4 3.7 3.8 4.1  CL 91* 91* 88* 95* 98 99 102  CO2 19* 19* 21* 21* 20* 24 24  GLUCOSE 157* 198* 147* 147* 182* 164* 168*  BUN 43* 47* 50* 48* 48* 44* 41*  CREATININE 4.46* 5.52* 6.29* 6.75* 6.22* 5.05* 4.46*  CALCIUM 7.9* 7.6* 7.7* 8.1* 8.1* 8.1* 8.3*  PHOS 5.8* 7.1*  --  7.9* 6.9* 4.9* 4.4   CBC Recent Labs  Lab 03/13/23 0222 03/14/23 0419 03/16/23 0620  WBC 8.8 7.8 9.1  HGB 11.1* 11.0* 12.6*  HCT 32.4* 31.3* 35.3*  MCV 78.3* 77.1* 78.4*  PLT 175 187 338    Medications:     Chlorhexidine Gluconate Cloth  6 each Topical Daily   heparin  5,000 Units Subcutaneous Q8H   insulin aspart  0-15 Units Subcutaneous TID WC   insulin aspart  0-5 Units Subcutaneous QHS   insulin glargine  10 Units Subcutaneous QHS   living well with diabetes book   Does not apply Once   rosuvastatin  10 mg Oral Daily   Arita Miss, MD  [facility] 03/19/2023, 11:51 AM

## 2023-03-19 NOTE — Plan of Care (Signed)
  Problem: Health Behavior/Discharge Planning: Goal: Ability to manage health-related needs will improve Outcome: Progressing   Problem: Clinical Measurements: Goal: Diagnostic test results will improve Outcome: Progressing Goal: Respiratory complications will improve Outcome: Progressing Goal: Cardiovascular complication will be avoided Outcome: Progressing   Problem: Activity: Goal: Risk for activity intolerance will decrease Outcome: Progressing   Problem: Nutrition: Goal: Adequate nutrition will be maintained Outcome: Progressing   Problem: Coping: Goal: Level of anxiety will decrease Outcome: Progressing   Problem: Elimination: Goal: Will not experience complications related to bowel motility Outcome: Progressing Goal: Will not experience complications related to urinary retention Outcome: Progressing   Problem: Pain Managment: Goal: General experience of comfort will improve and/or be controlled Outcome: Progressing   Problem: Safety: Goal: Ability to remain free from injury will improve Outcome: Progressing   Problem: Skin Integrity: Goal: Risk for impaired skin integrity will decrease Outcome: Progressing   Problem: Education: Goal: Ability to describe self-care measures that may prevent or decrease complications (Diabetes Survival Skills Education) will improve Outcome: Progressing Goal: Individualized Educational Video(s) Outcome: Progressing   Problem: Coping: Goal: Ability to adjust to condition or change in health will improve Outcome: Progressing   Problem: Fluid Volume: Goal: Ability to maintain a balanced intake and output will improve Outcome: Progressing   Problem: Health Behavior/Discharge Planning: Goal: Ability to identify and utilize available resources and services will improve Outcome: Progressing Goal: Ability to manage health-related needs will improve Outcome: Progressing   Problem: Metabolic: Goal: Ability to maintain  appropriate glucose levels will improve Outcome: Progressing   Problem: Nutritional: Goal: Maintenance of adequate nutrition will improve Outcome: Progressing Goal: Progress toward achieving an optimal weight will improve Outcome: Progressing   Problem: Skin Integrity: Goal: Risk for impaired skin integrity will decrease Outcome: Progressing   Problem: Tissue Perfusion: Goal: Adequacy of tissue perfusion will improve Outcome: Progressing

## 2023-03-20 ENCOUNTER — Encounter (HOSPITAL_COMMUNITY): Payer: Self-pay

## 2023-03-20 LAB — SURGICAL PATHOLOGY
